# Patient Record
Sex: Male | Born: 1935 | Race: Black or African American | Hispanic: No | Marital: Single | Smoking: Current every day smoker
Health system: Southern US, Community
[De-identification: ages and names within clinical notes are randomized; demographics above are authoritative.]

## PROBLEM LIST (undated history)

## (undated) DIAGNOSIS — E119 Type 2 diabetes mellitus without complications: Secondary | ICD-10-CM

## (undated) DIAGNOSIS — IMO0001 Reserved for inherently not codable concepts without codable children: Secondary | ICD-10-CM

## (undated) HISTORY — PX: APPENDECTOMY (OPEN): SHX54

---

## 2002-08-12 ENCOUNTER — Emergency Department: Admit: 2002-08-12 | Payer: Self-pay | Source: Emergency Department | Admitting: Emergency Medicine

## 2003-03-11 ENCOUNTER — Ambulatory Visit
Admit: 2003-03-11 | Disposition: A | Discharge status: Home / Self Care | Payer: Self-pay | Source: Ambulatory Visit | Admitting: Internal Medicine

## 2003-07-09 ENCOUNTER — Ambulatory Visit
Admit: 2003-07-09 | Disposition: A | Discharge status: Home / Self Care | Payer: Self-pay | Source: Ambulatory Visit | Admitting: Internal Medicine

## 2004-02-25 ENCOUNTER — Ambulatory Visit
Admit: 2004-02-25 | Disposition: A | Discharge status: Home / Self Care | Payer: Self-pay | Source: Ambulatory Visit | Admitting: Internal Medicine

## 2004-03-02 ENCOUNTER — Ambulatory Visit
Admit: 2004-03-02 | Disposition: A | Discharge status: Home / Self Care | Payer: Self-pay | Source: Ambulatory Visit | Admitting: Internal Medicine

## 2004-03-17 ENCOUNTER — Ambulatory Visit: Admit: 2004-03-17 | Disposition: A | Discharge status: Home / Self Care | Payer: Self-pay | Source: Ambulatory Visit

## 2004-07-07 ENCOUNTER — Ambulatory Visit
Admit: 2004-07-07 | Disposition: A | Discharge status: Home / Self Care | Payer: Self-pay | Source: Ambulatory Visit | Admitting: Internal Medicine

## 2004-08-05 ENCOUNTER — Ambulatory Visit
Admit: 2004-08-05 | Disposition: A | Discharge status: Home / Self Care | Payer: Self-pay | Source: Ambulatory Visit | Admitting: Orthopaedic Surgery

## 2004-09-25 ENCOUNTER — Inpatient Hospital Stay
Admission: RE | Admit: 2004-09-25 | Disposition: A | Discharge status: Home / Self Care | Payer: Self-pay | Source: Ambulatory Visit | Admitting: Internal Medicine

## 2004-11-30 ENCOUNTER — Ambulatory Visit
Admit: 2004-11-30 | Disposition: A | Discharge status: Home / Self Care | Payer: Self-pay | Source: Ambulatory Visit | Admitting: Orthopaedic Surgery

## 2005-01-31 ENCOUNTER — Inpatient Hospital Stay
Admission: EM | Admit: 2005-01-31 | Disposition: A | Discharge status: Home / Self Care | Payer: Self-pay | Source: Emergency Department | Admitting: Internal Medicine

## 2005-08-18 ENCOUNTER — Emergency Department: Admit: 2005-08-18 | Payer: Self-pay | Source: Emergency Department | Admitting: Addiction Medicine

## 2006-07-16 ENCOUNTER — Ambulatory Visit
Admit: 2006-07-16 | Disposition: A | Discharge status: Home / Self Care | Payer: Self-pay | Source: Ambulatory Visit | Admitting: Internal Medicine

## 2006-10-31 ENCOUNTER — Emergency Department: Admit: 2006-10-31 | Payer: Self-pay | Source: Emergency Department | Admitting: Pediatric Emergency Medicine

## 2006-10-31 LAB — BASIC METABOLIC PANEL - AH CERNER
Anion Gap: 8 mEq/L (ref 5–15)
BUN: 17 mg/dL (ref 8–20)
CO2: 25.1 mEq/L (ref 22.0–29.0)
Calcium: 9 mg/dL (ref 8.8–10.2)
Chloride: 104 mEq/L (ref 96–108)
Creatinine: 1.1 mg/dL (ref 0.5–1.2)
Glucose: 221 mg/dL
Osmolality Calculated: 292 mosm/kg (ref 282–298)
Potassium: 4.5 mEq/L (ref 3.3–5.1)
Sodium: 137 mEq/L (ref 135–145)
UN/CREA SOFT: 15 RATIO (ref 6–33)

## 2006-10-31 LAB — URINALYSIS WITH MICROSCOPIC
Bilirubin, UA: NEGATIVE
Blood, UA: NEGATIVE
Glucose, UA: 500
Ketones UA: NEGATIVE
Leukocyte Esterase, UA: NEGATIVE
Nitrite, UA: NEGATIVE
Protein, UR: NEGATIVE
Specific Gravity UA POCT: 1.031 — ABNORMAL HIGH (ref 1.005–1.030)
Urine pH: 5 (ref 4.6–8.0)
Urobilinogen, UA: 1 EU/dL (ref 0.2–1.0)
WBC, UA: 6 /HPF — ABNORMAL HIGH (ref 0–5)

## 2006-10-31 LAB — CBC WITH AUTO DIFFERENTIAL CERNER
Basophils Absolute: 0 /mm3 (ref 0.0–0.2)
Basophils: 0 % (ref 0–2)
Eosinophils Absolute: 0 /mm3 (ref 0.0–0.2)
Eosinophils: 1 % (ref 0–5)
Granulocytes Absolute: 4.4 /mm3 (ref 1.8–8.1)
Hematocrit: 41.3 % — ABNORMAL LOW (ref 42.0–52.0)
Hgb: 14 G/DL (ref 13.0–17.0)
Lymphocytes Absolute: 0.8 /mm3 (ref 0.5–4.4)
Lymphocytes: 13 % — ABNORMAL LOW (ref 15–41)
MCH: 31.9 PG (ref 28.0–32.0)
MCHC: 33.9 G/DL (ref 32.0–36.0)
MCV: 93.9 FL (ref 80.0–100.0)
MPV: 8.7 FL (ref 7.4–10.4)
Monocytes Absolute: 0.5 /mm3 (ref 0.0–1.2)
Monocytes: 9 % (ref 0–11)
Neutrophils %: 77 % — ABNORMAL HIGH (ref 52–75)
Platelets: 170 /mm3 (ref 140–400)
RBC: 4.39 /mm3 — ABNORMAL LOW (ref 4.70–6.00)
RDW: 13.1 % (ref 11.5–15.0)
WBC: 5.8 /mm3 (ref 3.5–10.8)

## 2006-10-31 LAB — RAPID INFLUENZA A/B ANTIGENS

## 2006-10-31 LAB — HEMOLYSIS INDEX: Hemolysis Index: 5 Units

## 2006-10-31 LAB — GFR

## 2006-11-14 ENCOUNTER — Ambulatory Visit
Admit: 2006-11-14 | Disposition: A | Discharge status: Home / Self Care | Payer: Self-pay | Source: Ambulatory Visit | Admitting: Internal Medicine

## 2006-11-14 LAB — COMPREHENSIVE METABOLIC PANEL - AH CERNER
ALT: 16 U/L (ref 0–41)
AST (SGOT): 17 U/L (ref 0–37)
Albumin/Globulin Ratio: 1.5 (ref 1.1–2.2)
Albumin: 4.1 g/dL (ref 3.4–4.8)
Alkaline Phosphatase: 87 U/L (ref 40–129)
Anion Gap: 9 mEq/L (ref 5–15)
BUN: 12 mg/dL (ref 8–20)
Bilirubin, Total: 0.4 mg/dL (ref 0.0–1.0)
CA: 3.9 mEq/L (ref 3.8–4.6)
CO2: 25.7 mEq/L (ref 22.0–29.0)
Calcium: 8.8 mg/dL (ref 8.8–10.2)
Chloride: 106 mEq/L (ref 96–108)
Creatinine: 1 mg/dL (ref 0.5–1.2)
Globulin: 2.7 g/dL (ref 2.0–3.6)
Glucose: 221 mg/dL
Osmolality Calculated: 299 mosm/kg — ABNORMAL HIGH (ref 282–298)
Potassium: 4.2 mEq/L (ref 3.3–5.1)
Protein, Total: 6.8 g/dL (ref 6.4–8.3)
Sodium: 141 mEq/L (ref 135–145)
UN/CREA SOFT: 12 RATIO (ref 6–33)

## 2006-11-14 LAB — LIPID STUDY FASTING AM CERNER
Cholesterol / HDL Ratio: 4.68 RATIO (ref ?–4.97)
Cholesterol: 206 mg/dL — ABNORMAL HIGH (ref 100–199)
HDL: 44 mg/dL — ABNORMAL LOW (ref >55)
LDL: 133 mg/dL — ABNORMAL HIGH (ref ?–130)
Triglycerides: 143 md/dL (ref 10–190)
VLDL: 29 mg/dL (ref 0–40)

## 2006-11-14 LAB — GLYCOHEMOGLOBIN A1C* CERNER: % Hgb A1C: 9.7 % — ABNORMAL HIGH (ref 4.8–6.0)

## 2006-11-14 LAB — PSA: Prostate Specific Antigen, Total: 0.689 ng/mL (ref 0.000–4.000)

## 2006-11-14 LAB — GFR

## 2006-11-14 LAB — HEMOLYSIS INDEX: Hemolysis Index: 8 Units

## 2007-12-30 ENCOUNTER — Ambulatory Visit
Admit: 2007-12-30 | Disposition: A | Discharge status: Home / Self Care | Payer: Self-pay | Source: Ambulatory Visit | Admitting: Internal Medicine

## 2007-12-30 LAB — COMPREHENSIVE METABOLIC PANEL - AH CERNER
ALT: 14 U/L (ref 0–41)
AST (SGOT): 17 U/L (ref 0–37)
Albumin/Globulin Ratio: 1.5 (ref 1.1–2.2)
Albumin: 4.4 g/dL (ref 3.4–4.8)
Alkaline Phosphatase: 74 U/L (ref 40–129)
Anion Gap: 10 mEq/L (ref 5–15)
BUN: 21 mg/dL (ref 8–23)
Bilirubin, Total: 0.5 mg/dL (ref 0.0–1.0)
CA: 4 mEq/L (ref 3.8–4.6)
CO2: 26.4 mEq/L (ref 23.0–32.5)
Calcium: 9.4 mg/dL (ref 8.8–10.2)
Chloride: 106 mEq/L (ref 96–108)
Creatinine: 1 mg/dL (ref 0.5–1.2)
Globulin: 3 g/dL (ref 2.0–3.6)
Glucose: 88 mg/dL (ref 70–100)
Osmolality Calculated: 296 mosm/kg (ref 282–298)
Potassium: 5.2 mEq/L — ABNORMAL HIGH (ref 3.3–5.1)
Protein, Total: 7.4 g/dL (ref 6.4–8.3)
Sodium: 142 mEq/L (ref 133–145)
UN/CREA SOFT: 21 RATIO (ref 6–33)

## 2007-12-30 LAB — GFR

## 2007-12-30 LAB — CBC AND DIFFERENTIAL
Basophils Absolute: 0 /mm3 (ref 0.0–0.2)
Basophils: 0 % (ref 0–2)
Eosinophils Absolute: 0.1 /mm3 (ref 0.0–0.7)
Eosinophils: 2 % (ref 0–5)
Granulocytes Absolute: 3.3 /mm3 (ref 1.8–8.1)
Hematocrit: 45.3 % (ref 42.0–52.0)
Hgb: 14.8 G/DL (ref 13.0–17.0)
Immature Granulocytes Absolute: 0 CUMM (ref 0.0–0.0)
Immature Granulocytes: 0 % (ref 0–1)
Lymphocytes Absolute: 1.3 /mm3 (ref 0.5–4.4)
Lymphocytes: 25 % (ref 15–41)
MCH: 31.5 PG (ref 28.0–32.0)
MCHC: 32.7 G/DL (ref 32.0–36.0)
MCV: 96.4 FL (ref 80.0–100.0)
MPV: 10.7 FL (ref 9.4–12.3)
Monocytes Absolute: 0.5 /mm3 (ref 0.0–1.2)
Monocytes: 10 % (ref 0–11)
Neutrophils %: 63 % (ref 52–75)
Platelets: 236 /mm3 (ref 140–400)
RBC: 4.7 /mm3 (ref 4.70–6.00)
RDW: 13.6 % (ref 11.5–15.0)
WBC: 5.24 /mm3 (ref 3.50–10.80)

## 2007-12-30 LAB — PSA: Prostate Specific Antigen, Total: 0.671 ng/mL (ref 0.000–4.000)

## 2007-12-30 LAB — LIPID STUDY FASTING AM CERNER
Cholesterol / HDL Ratio: 3.56 RATIO (ref ?–4.97)
Cholesterol: 171 mg/dL (ref 100–199)
HDL: 48 mg/dL — ABNORMAL LOW (ref >55)
LDL: 111 mg/dL (ref ?–130)
Triglycerides: 58 md/dL (ref 10–190)
VLDL: 12 mg/dL (ref 0–40)

## 2007-12-30 LAB — HEMOLYSIS INDEX: Hemolysis Index: 3 Units

## 2007-12-30 LAB — URIC ACID: Uric acid: 5.7 mg/dL (ref 3.4–7.0)

## 2007-12-30 LAB — VITAMIN B12: Vitamin B-12: 284 pg/mL (ref 243–894)

## 2007-12-30 LAB — TSH: TSH: 1.27 u[IU]/mL (ref 0.400–4.610)

## 2008-03-06 ENCOUNTER — Ambulatory Visit
Admit: 2008-03-06 | Disposition: A | Discharge status: Home / Self Care | Payer: Self-pay | Source: Ambulatory Visit | Admitting: Internal Medicine

## 2009-04-16 ENCOUNTER — Ambulatory Visit
Admit: 2009-04-16 | Disposition: A | Discharge status: Home / Self Care | Payer: Self-pay | Source: Ambulatory Visit | Admitting: Internal Medicine

## 2009-04-16 LAB — URINALYSIS WITH MICROSCOPIC
Bilirubin, UA: NEGATIVE
Blood, UA: NEGATIVE
Glucose, UA: NEGATIVE
Ketones UA: NEGATIVE
Leukocyte Esterase, UA: NEGATIVE
Nitrite, UA: NEGATIVE
Protein, UR: NEGATIVE
RBC, UA: 1 /HPF (ref 0–3)
Specific Gravity UA POCT: 1.02 (ref 1.005–1.030)
Urine pH: 6 (ref 4.6–8.0)
Urobilinogen, UA: NORMAL mg/dL
WBC, UA: <=1 /HPF (ref 0–?)

## 2009-04-16 LAB — COMPREHENSIVE METABOLIC PANEL
ALT: 11 U/L (ref 0–55)
AST (SGOT): 15 U/L (ref 5–34)
Albumin/Globulin Ratio: 1.2 (ref 0.9–2.2)
Albumin: 3.7 g/dL (ref 3.5–5.0)
Alkaline Phosphatase: 77 U/L (ref 40–150)
BUN: 21 mg/dL (ref 9–21)
Bilirubin, Total: 0.5 mg/dL (ref 0.2–1.2)
CO2: 25 mEq/L (ref 22–29)
Calcium: 9 mg/dL (ref 8.9–10.0)
Chloride: 108 mEq/L — ABNORMAL HIGH (ref 96–107)
Creatinine: 1 mg/dL (ref 0.7–1.3)
Globulin: 3.2 g/dL (ref 2.0–3.6)
Glucose: 120 mg/dL — ABNORMAL HIGH (ref 70–110)
Potassium: 4.5 mEq/L (ref 3.5–5.1)
Protein, Total: 6.9 g/dL (ref 6.0–8.3)
Sodium: 140 mEq/L (ref 136–145)

## 2009-04-16 LAB — LIPID PANEL
Cholesterol: 152 mg/dL (ref ?–199)
HDL: 41 mg/dL (ref 40–65)
LDL Calculated: 94 mg/dL (ref 0–99)
Triglycerides: 86 mg/dL (ref 34–149)
VLDL Calculated: 17 mg/dL (ref 7–30)

## 2009-04-16 LAB — HEMOLYSIS INDEX: Hemolysis Index: 2 Units

## 2009-04-16 LAB — HEMOGLOBIN A1C: Hemoglobin A1C: 6.5 % — ABNORMAL HIGH (ref ?–6.0)

## 2009-04-16 LAB — GFR

## 2009-04-16 LAB — CH/HDL: Cholesterol / HDL Ratio: 3.7 RATIO

## 2009-04-18 LAB — MICROALBUMIN-RANDOM, U

## 2009-07-04 ENCOUNTER — Ambulatory Visit
Admit: 2009-07-04 | Disposition: A | Discharge status: Home / Self Care | Payer: Self-pay | Source: Ambulatory Visit | Admitting: Internal Medicine

## 2009-07-04 LAB — COMPREHENSIVE METABOLIC PANEL
ALT: 10 U/L (ref 0–55)
AST (SGOT): 15 U/L (ref 5–34)
Albumin/Globulin Ratio: 1.1 (ref 0.9–2.2)
Albumin: 3.7 g/dL (ref 3.5–5.0)
Alkaline Phosphatase: 68 U/L (ref 40–150)
BUN: 21 mg/dL (ref 9–21)
Bilirubin, Total: 0.5 mg/dL (ref 0.2–1.2)
CO2: 23 mEq/L (ref 22–29)
Calcium: 8.8 mg/dL (ref 7.9–10.6)
Chloride: 107 mEq/L (ref 96–107)
Creatinine: 1.1 mg/dL (ref 0.7–1.3)
Globulin: 3.3 g/dL (ref 2.0–3.6)
Glucose: 133 mg/dL — ABNORMAL HIGH (ref 70–110)
Potassium: 4.4 mEq/L (ref 3.5–5.1)
Protein, Total: 7 g/dL (ref 6.0–8.3)
Sodium: 139 mEq/L (ref 136–145)

## 2009-07-04 LAB — TSH: TSH: 1.201 u[IU]/mL (ref 0.340–4.820)

## 2009-07-04 LAB — CBC AND DIFFERENTIAL
Basophils Absolute: 0 /mm3 (ref 0.0–0.2)
Basophils: 0 % (ref 0–2)
Eosinophils Absolute: 0.1 /mm3 (ref 0.0–0.7)
Eosinophils: 2 % (ref 0–5)
Granulocytes Absolute: 2.7 /mm3 (ref 1.8–8.1)
Hematocrit: 42.2 % (ref 42.0–52.0)
Hgb: 14.2 G/DL (ref 13.0–17.0)
Immature Granulocytes Absolute: 0 CUMM (ref 0.0–0.0)
Immature Granulocytes: 0 % (ref 0–1)
Lymphocytes Absolute: 1.5 /mm3 (ref 0.5–4.4)
Lymphocytes: 32 % (ref 15–41)
MCH: 31.8 PG (ref 28.0–32.0)
MCHC: 33.6 G/DL (ref 32.0–36.0)
MCV: 94.6 FL (ref 80.0–100.0)
MPV: 11.2 FL (ref 9.4–12.3)
Monocytes Absolute: 0.3 /mm3 (ref 0.0–1.2)
Monocytes: 7 % (ref 0–11)
Neutrophils %: 59 % (ref 52–75)
Platelets: 198 /mm3 (ref 140–400)
RBC: 4.46 /mm3 — ABNORMAL LOW (ref 4.70–6.00)
RDW: 13.1 % (ref 11.5–15.0)
WBC: 4.58 /mm3 (ref 3.50–10.80)

## 2009-07-04 LAB — LIPID PANEL
Cholesterol: 181 mg/dL (ref ?–199)
HDL: 44 mg/dL (ref 40–65)
LDL Calculated: 118 mg/dL — ABNORMAL HIGH (ref 0–99)
Triglycerides: 97 mg/dL (ref 34–149)
VLDL Calculated: 19 mg/dL (ref 7–30)

## 2009-07-04 LAB — CH/HDL: Cholesterol / HDL Ratio: 4.1 RATIO

## 2009-07-04 LAB — HEMOLYSIS INDEX: Hemolysis Index: 7 Units

## 2009-07-04 LAB — GFR

## 2009-07-04 LAB — HEMOGLOBIN A1C: Hemoglobin A1C: 6.7 % — ABNORMAL HIGH (ref ?–6.0)

## 2009-07-04 LAB — URIC ACID: Uric acid: 5.1 mg/dL (ref 3.5–7.2)

## 2009-07-05 LAB — VITAMIN B12: Vitamin B-12: 231 pg/mL (ref 211–911)

## 2009-09-30 ENCOUNTER — Ambulatory Visit
Admit: 2009-09-30 | Disposition: A | Discharge status: Home / Self Care | Payer: Self-pay | Source: Ambulatory Visit | Admitting: Internal Medicine

## 2009-10-01 ENCOUNTER — Ambulatory Visit
Admit: 2009-10-01 | Disposition: A | Discharge status: Home / Self Care | Payer: Self-pay | Source: Ambulatory Visit | Admitting: Internal Medicine

## 2009-10-08 ENCOUNTER — Ambulatory Visit
Admit: 2009-10-08 | Disposition: A | Discharge status: Home / Self Care | Payer: Self-pay | Source: Ambulatory Visit | Admitting: Internal Medicine

## 2009-10-08 LAB — HEMOLYSIS INDEX
Hemolysis Index: 5
Hemolysis Index: 10

## 2009-10-08 LAB — COMPREHENSIVE METABOLIC PANEL
ALT: 11 U/L (ref 0–55)
AST (SGOT): 16 U/L (ref 5–34)
Albumin/Globulin Ratio: 1.1 (ref 0.9–2.2)
Albumin: 3.9 g/dL (ref 3.5–5.0)
Alkaline Phosphatase: 67 U/L (ref 40–150)
BUN: 17 mg/dL (ref 9.0–21.0)
Bilirubin, Total: 0.5 mg/dL (ref 0.2–1.2)
CO2: 25 mEq/L (ref 22–29)
Calcium: 9.1 mg/dL (ref 7.9–10.6)
Chloride: 107 mEq/L (ref 98–107)
Creatinine: 1 mg/dL (ref 0.7–1.3)
Globulin: 3.5 g/dL (ref 2.0–3.6)
Glucose: 136 mg/dL — ABNORMAL HIGH (ref 70–100)
Potassium: 4.6 mEq/L (ref 3.5–5.1)
Protein, Total: 7.4 g/dL (ref 6.0–8.3)
Sodium: 140 mEq/L (ref 136–145)

## 2009-10-08 LAB — LIPID PANEL
Cholesterol / HDL Ratio: 5 mg/dL (ref 0–200)
Cholesterol: 218 mg/dL — ABNORMAL HIGH (ref 0–199)
HDL: 46 mg/dL (ref >40)
LDL Calculated: 154 mg/dL — ABNORMAL HIGH (ref 0–99)
Triglycerides: 91 mg/dL (ref 34–149)
VLDL Calculated: 18 mg/dL (ref 10–40)

## 2009-10-08 LAB — URIC ACID: Uric acid: 4.5 mg/dL (ref 3.5–7.2)

## 2009-10-08 LAB — TSH: TSH: 1.54 u[IU]/mL (ref 0.35–4.94)

## 2009-10-08 LAB — GFR: EGFR: >60

## 2009-10-08 LAB — PSA: Prostate Specific Antigen, Total: 0.648 ng/mL (ref 0.000–4.000)

## 2009-10-08 LAB — HEMOGLOBIN A1C: Hemoglobin A1C: 6.5 % — ABNORMAL HIGH (ref 0.0–6.0)

## 2009-10-09 LAB — VITAMIN B12: Vitamin B-12: 359 pg/mL (ref 211–911)

## 2009-10-29 ENCOUNTER — Ambulatory Visit
Admit: 2009-10-29 | Disposition: A | Discharge status: Home / Self Care | Payer: Self-pay | Source: Ambulatory Visit | Admitting: Internal Medicine

## 2010-02-04 ENCOUNTER — Ambulatory Visit
Admit: 2010-02-04 | Disposition: A | Discharge status: Home / Self Care | Payer: Self-pay | Source: Ambulatory Visit | Admitting: Internal Medicine

## 2010-02-04 LAB — LIPID PANEL
Cholesterol / HDL Ratio: 4.5 Index
Cholesterol: 149 mg/dL (ref 0–199)
HDL: 33 mg/dL — ABNORMAL LOW (ref >40)
LDL Calculated: 89 mg/dL (ref 0–99)
Triglycerides: 136 mg/dL (ref 34–149)
VLDL Calculated: 27 mg/dL (ref 10–40)

## 2010-02-04 LAB — COMPREHENSIVE METABOLIC PANEL
ALT: 10 U/L (ref 0–55)
AST (SGOT): 15 U/L (ref 5–34)
Albumin/Globulin Ratio: 1.1 (ref 0.9–2.2)
Albumin: 3.5 g/dL (ref 3.5–5.0)
Alkaline Phosphatase: 78 U/L (ref 40–150)
BUN: 11 mg/dL (ref 9.0–21.0)
Bilirubin, Total: 0.6 mg/dL (ref 0.2–1.2)
CO2: 24 mEq/L (ref 22–29)
Calcium: 9 mg/dL (ref 7.9–10.6)
Chloride: 107 mEq/L (ref 98–107)
Creatinine: 0.8 mg/dL (ref 0.7–1.3)
Globulin: 3.2 g/dL (ref 2.0–3.6)
Glucose: 112 mg/dL — ABNORMAL HIGH (ref 70–100)
Potassium: 3.9 mEq/L (ref 3.5–5.1)
Protein, Total: 6.7 g/dL (ref 6.0–8.3)
Sodium: 141 mEq/L (ref 136–145)

## 2010-02-04 LAB — GFR: EGFR: >60

## 2010-02-04 LAB — HEMOGLOBIN A1C: Hemoglobin A1C: 6.1 % — ABNORMAL HIGH (ref 0.0–6.0)

## 2010-02-04 LAB — HEMOLYSIS INDEX
Hemolysis Index: 2 Index (ref 0–18)
Hemolysis Index: 2 Index (ref 0–18)

## 2010-03-03 ENCOUNTER — Ambulatory Visit
Admit: 2010-03-03 | Disposition: A | Discharge status: Home / Self Care | Payer: Self-pay | Source: Ambulatory Visit | Admitting: Otolaryngic Allergy

## 2010-06-24 ENCOUNTER — Ambulatory Visit
Admit: 2010-06-24 | Disposition: A | Discharge status: Home / Self Care | Payer: Self-pay | Source: Ambulatory Visit | Admitting: Internal Medicine

## 2010-10-01 ENCOUNTER — Ambulatory Visit
Admit: 2010-10-01 | Disposition: A | Discharge status: Home / Self Care | Payer: Self-pay | Source: Ambulatory Visit | Admitting: Internal Medicine

## 2010-10-01 LAB — URINALYSIS WITH MICROSCOPIC
Bilirubin, UA: NEGATIVE
Blood, UA: NEGATIVE
Glucose, UA: 500 — AB
Hyaline Casts, UA: 1 /LPF (ref 0–5)
Ketones UA: NEGATIVE
Leukocyte Esterase, UA: NEGATIVE
Nitrite, UA: NEGATIVE
Protein, UR: NEGATIVE
RBC, UA: <1 /HPF (ref 0–5)
Specific Gravity UA POCT: 1.03 (ref 1.001–1.035)
Squamous Epithelial Cells, Urine: <1 /HPF (ref 0–25)
Urine pH: 5 (ref 5.0–8.0)
Urobilinogen, UA: NORMAL mg/dL
WBC, UA: 1 /HPF (ref 0–5)

## 2010-10-01 LAB — CBC AND DIFFERENTIAL
Baso(Absolute): 0.02 10*3/uL (ref 0.00–0.20)
Basophils: 0 % (ref 0–2)
Eosinophils Absolute: 0.16 10*3/uL (ref 0.00–0.70)
Eosinophils: 3 % (ref 0–5)
Hematocrit: 42.9 % (ref 42.0–52.0)
Hgb: 14.6 g/dL (ref 13.0–17.0)
Immature Granulocytes Absolute: 0 10*3/uL
Immature Granulocytes: 0 % (ref 0–1)
Lymphocytes Absolute: 1.62 10*3/uL (ref 0.50–4.40)
Lymphocytes: 29 % (ref 15–41)
MCH: 32.2 pg — ABNORMAL HIGH (ref 28.0–32.0)
MCHC: 34 g/dL (ref 32.0–36.0)
MCV: 94.5 fL (ref 80.0–100.0)
MPV: 10.6 fL (ref 9.4–12.3)
Monocytes Absolute: 0.39 10*3/uL (ref 0.00–1.20)
Monocytes: 7 % (ref 0–11)
Neutrophils Absolute: 3.45 10*3/uL
Neutrophils: 61 % (ref 52–75)
Platelets: 236 10*3/uL (ref 140–400)
RBC: 4.54 10*6/uL — ABNORMAL LOW (ref 4.70–6.00)
RDW: 13 % (ref 12–15)
WBC: 5.64 10*3/uL (ref 3.50–10.80)

## 2010-10-01 LAB — COMPREHENSIVE METABOLIC PANEL
ALT: 9 U/L (ref 0–55)
AST (SGOT): 15 U/L (ref 5–34)
Albumin/Globulin Ratio: 1.1 (ref 0.9–2.2)
Albumin: 3.8 g/dL (ref 3.5–5.0)
Alkaline Phosphatase: 78 U/L (ref 40–150)
Anion Gap: 11 (ref 5.0–15.0)
BUN: 20 mg/dL (ref 9.0–21.0)
Bilirubin, Total: 0.4 mg/dL (ref 0.2–1.2)
CO2: 22 mEq/L (ref 22–29)
Calcium: 9.4 mg/dL (ref 7.9–10.6)
Chloride: 109 mEq/L — ABNORMAL HIGH (ref 98–107)
Creatinine: 1 mg/dL (ref 0.7–1.3)
Globulin: 3.6 g/dL (ref 2.0–3.6)
Glucose: 95 mg/dL (ref 70–100)
Potassium: 4.4 mEq/L (ref 3.5–5.1)
Protein, Total: 7.4 g/dL (ref 6.0–8.3)
Sodium: 142 mEq/L (ref 136–145)

## 2010-10-01 LAB — HEMOGLOBIN A1C: Hemoglobin A1C: 6 % (ref 0.0–6.0)

## 2010-10-01 LAB — VITAMIN B12: Vitamin B-12: 360 pg/mL (ref 211–911)

## 2010-10-01 LAB — LIPID PANEL
Cholesterol / HDL Ratio: 4.6 Index
Cholesterol: 196 mg/dL (ref 0–199)
HDL: 43 mg/dL (ref >40)
LDL Calculated: 133 mg/dL — ABNORMAL HIGH (ref 0–99)
Triglycerides: 99 mg/dL (ref 34–149)
VLDL Calculated: 20 mg/dL (ref 10–40)

## 2010-10-01 LAB — HEMOLYSIS INDEX
Hemolysis Index: 5 Index (ref 0–18)
Hemolysis Index: 6 Index (ref 0–9)

## 2010-10-01 LAB — URIC ACID: Uric acid: 5 mg/dL (ref 3.5–7.2)

## 2010-10-01 LAB — TSH: TSH: 2.34 u[IU]/mL (ref 0.35–4.94)

## 2010-10-01 LAB — GFR: EGFR: >60

## 2010-10-01 LAB — PSA: Prostate Specific Antigen, Total: 0.812 ng/mL (ref 0.000–4.000)

## 2010-10-02 LAB — MICROALBUMIN-RANDOM, U
Albumin/Creatinine Ratio: 3 mg/g (ref <17)
Creatinine: 250 mg/dL
Microalbumin: 7.3 mg/L

## 2011-03-11 ENCOUNTER — Ambulatory Visit: Admit: 2011-03-11 | Discharge: 2011-03-11 | Payer: Self-pay | Source: Ambulatory Visit

## 2011-03-11 LAB — COMPREHENSIVE METABOLIC PANEL
ALT: 10 U/L (ref 0–55)
AST (SGOT): 13 U/L (ref 5–34)
Albumin/Globulin Ratio: 1 (ref 0.9–2.2)
Albumin: 3.6 g/dL (ref 3.5–5.0)
Alkaline Phosphatase: 78 U/L (ref 40–150)
Anion Gap: 7 (ref 5.0–15.0)
BUN: 21 mg/dL (ref 9.0–21.0)
Bilirubin, Total: 0.4 mg/dL (ref 0.2–1.2)
CO2: 25 mEq/L (ref 22–29)
Calcium: 8.8 mg/dL (ref 7.9–10.6)
Chloride: 107 mEq/L (ref 98–107)
Creatinine: 1 mg/dL (ref 0.7–1.3)
Globulin: 3.6 g/dL (ref 2.0–3.6)
Glucose: 164 mg/dL — ABNORMAL HIGH (ref 70–100)
Potassium: 4.3 mEq/L (ref 3.5–5.1)
Protein, Total: 7.2 g/dL (ref 6.0–8.3)
Sodium: 139 mEq/L (ref 136–145)

## 2011-03-11 LAB — HEMOLYSIS INDEX
Hemolysis Index: 0 Index (ref 0–18)
Hemolysis Index: 5 Index (ref 0–9)

## 2011-03-11 LAB — GFR: EGFR: >60

## 2011-03-11 LAB — LIPID PANEL
Cholesterol / HDL Ratio: 4.4 Index
Cholesterol: 203 mg/dL — ABNORMAL HIGH (ref 0–199)
HDL: 46 mg/dL (ref >40)
LDL Calculated: 137 mg/dL — ABNORMAL HIGH (ref 0–99)
Triglycerides: 101 mg/dL (ref 34–149)
VLDL Calculated: 20 mg/dL (ref 10–40)

## 2011-03-11 LAB — PSA: Prostate Specific Antigen, Total: 0.969 ng/mL (ref 0.000–4.000)

## 2011-03-12 LAB — HEMOGLOBIN A1C: Hemoglobin A1C: 6.3 % — ABNORMAL HIGH (ref 0.0–6.0)

## 2011-08-19 LAB — ECG 12-LEAD
Atrial Rate: 83 {beats}/min
P Axis: 47 degrees
P-R Interval: 172 ms
Q-T Interval: 344 ms
QRS Duration: 84 ms
QTC Calculation (Bezet): 404 ms
R Axis: 45 degrees
T Axis: 39 degrees
Ventricular Rate: 83 {beats}/min

## 2011-10-05 ENCOUNTER — Ambulatory Visit: Admit: 2011-10-05 | Disposition: A | Discharge status: Home / Self Care | Payer: Self-pay | Source: Ambulatory Visit

## 2011-10-05 LAB — URINE ICTOTEST

## 2011-10-05 LAB — CBC AND DIFFERENTIAL
Basophils Absolute Automated: 0.02 10*3/uL (ref 0.00–0.20)
Basophils Automated: 0 % (ref 0–2)
Eosinophils Absolute Automated: 0.11 10*3/uL (ref 0.00–0.70)
Eosinophils Automated: 2 % (ref 0–5)
Hematocrit: 40.7 % — ABNORMAL LOW (ref 42.0–52.0)
Hgb: 13 g/dL (ref 13.0–17.0)
Immature Granulocytes Absolute: 0.01 10*3/uL
Immature Granulocytes: 0 % (ref 0–1)
Lymphocytes Absolute Automated: 1.15 10*3/uL (ref 0.50–4.40)
Lymphocytes Automated: 20 % (ref 15–41)
MCH: 31.6 pg (ref 28.0–32.0)
MCHC: 31.9 g/dL — ABNORMAL LOW (ref 32.0–36.0)
MCV: 98.8 fL (ref 80.0–100.0)
MPV: 10.9 fL (ref 9.4–12.3)
Monocytes Absolute Automated: 0.38 10*3/uL (ref 0.00–1.20)
Monocytes: 7 % (ref 0–11)
Neutrophils Absolute: 3.97 10*3/uL (ref 1.80–8.10)
Neutrophils: 70 % (ref 52–75)
Nucleated RBC: 0 /100 WBC
Platelets: 239 10*3/uL (ref 140–400)
RBC: 4.12 10*6/uL — ABNORMAL LOW (ref 4.70–6.00)
RDW: 14 % (ref 12–15)
WBC: 5.64 10*3/uL (ref 3.50–10.80)

## 2011-10-05 LAB — URINALYSIS WITH MICROSCOPIC
Blood, UA: NEGATIVE
Glucose, UA: NEGATIVE
Leukocyte Esterase, UA: NEGATIVE
Nitrite, UA: NEGATIVE
Protein, UR: NEGATIVE
Specific Gravity UA POCT: 1.024 (ref 1.001–1.035)
Urine pH: 5.5 (ref 5.0–8.0)
Urobilinogen, UA: 0.2 EU/dL

## 2011-10-05 LAB — LIPID PANEL
Cholesterol / HDL Ratio: 3.7 Index
Cholesterol: 161 mg/dL (ref 0–199)
HDL: 44 mg/dL (ref >40)
LDL Calculated: 99 mg/dL (ref 0–99)
Triglycerides: 91 mg/dL (ref 34–149)
VLDL Calculated: 18 mg/dL (ref 10–40)

## 2011-10-05 LAB — COMPREHENSIVE METABOLIC PANEL
ALT: 21 U/L (ref 0–55)
AST (SGOT): 18 U/L (ref 5–34)
Albumin/Globulin Ratio: 1.1 (ref 0.9–2.2)
Albumin: 3.6 g/dL (ref 3.5–5.0)
Alkaline Phosphatase: 83 U/L (ref 40–150)
BUN: 12 mg/dL (ref 8.0–20.0)
Bilirubin, Total: 0.6 mg/dL (ref 0.1–1.2)
CO2: 24 mEq/L (ref 21–30)
Calcium: 9.1 mg/dL (ref 7.9–10.6)
Chloride: 107 mEq/L (ref 96–109)
Creatinine: 0.9 mg/dL (ref 0.5–1.5)
Globulin: 3.4 g/dL (ref 2.0–3.7)
Glucose: 107 mg/dL — ABNORMAL HIGH (ref 70–100)
Potassium: 4.6 mEq/L (ref 3.5–5.3)
Protein, Total: 7 g/dL (ref 6.0–8.3)
Sodium: 141 mEq/L (ref 135–146)

## 2011-10-05 LAB — GFR: EGFR: >60

## 2011-10-05 LAB — PSA: Prostate Specific Antigen, Total: 0.959 ng/mL (ref 0.000–4.000)

## 2011-10-05 LAB — T4, FREE: T4 Free: 1.02 ng/dL (ref 0.70–1.48)

## 2011-10-05 LAB — TSH: TSH: 1.17 u[IU]/mL (ref 0.35–4.94)

## 2011-10-05 LAB — HEMOLYSIS INDEX: Hemolysis Index: 1 Index (ref 0–9)

## 2011-10-06 LAB — HEMOGLOBIN A1C: Hemoglobin A1C: 6 % (ref 0.0–6.0)

## 2011-11-03 ENCOUNTER — Ambulatory Visit
Admit: 2011-11-03 | Discharge: 2011-11-03 | Disposition: A | Discharge status: Home / Self Care | Payer: Self-pay | Source: Ambulatory Visit

## 2011-11-03 LAB — COMPREHENSIVE METABOLIC PANEL
ALT: 12 U/L (ref 0–55)
AST (SGOT): 17 U/L (ref 5–34)
Albumin/Globulin Ratio: 1.1 (ref 0.9–2.2)
Albumin: 3.7 g/dL (ref 3.5–5.0)
Alkaline Phosphatase: 77 U/L (ref 40–150)
BUN: 14 mg/dL (ref 8.0–20.0)
Bilirubin, Total: 0.4 mg/dL (ref 0.1–1.2)
CO2: 25 mEq/L (ref 21–30)
Calcium: 9.3 mg/dL (ref 7.9–10.6)
Chloride: 107 mEq/L (ref 96–109)
Creatinine: 0.9 mg/dL (ref 0.5–1.5)
Globulin: 3.3 g/dL (ref 2.0–3.7)
Glucose: 119 mg/dL — ABNORMAL HIGH (ref 70–100)
Potassium: 5 mEq/L (ref 3.5–5.3)
Protein, Total: 7 g/dL (ref 6.0–8.3)
Sodium: 141 mEq/L (ref 135–146)

## 2011-11-03 LAB — LIPID PANEL
Cholesterol / HDL Ratio: 4.2 Index
Cholesterol: 165 mg/dL (ref 0–199)
HDL: 39 mg/dL — ABNORMAL LOW (ref >40)
LDL Calculated: 108 mg/dL — ABNORMAL HIGH (ref 0–99)
Triglycerides: 92 mg/dL (ref 34–149)
VLDL Calculated: 18 mg/dL (ref 10–40)

## 2011-11-03 LAB — HEMOLYSIS INDEX: Hemolysis Index: 3 Index (ref 0–9)

## 2011-11-03 LAB — GFR: EGFR: >60

## 2011-11-03 LAB — HEMOGLOBIN A1C: Hemoglobin A1C: 6.2 % — ABNORMAL HIGH (ref 0.0–6.0)

## 2012-02-25 ENCOUNTER — Ambulatory Visit
Admit: 2012-02-25 | Discharge: 2012-02-25 | Disposition: A | Discharge status: Home / Self Care | Payer: Self-pay | Source: Ambulatory Visit

## 2012-02-25 LAB — URINALYSIS, REFLEX TO MICROSCOPIC EXAM IF INDICATED
Bilirubin, UA: NEGATIVE
Blood, UA: NEGATIVE
Glucose, UA: NEGATIVE
Ketones UA: NEGATIVE
Leukocyte Esterase, UA: NEGATIVE
Nitrite, UA: NEGATIVE
Protein, UR: NEGATIVE
Specific Gravity UA: 1.02 (ref 1.001–1.035)
Urine pH: 5 (ref 5.0–8.0)
Urobilinogen, UA: NEGATIVE mg/dL

## 2012-02-25 LAB — TSH: TSH: 1.68 u[IU]/mL (ref 0.35–4.94)

## 2012-02-25 LAB — COMPREHENSIVE METABOLIC PANEL
ALT: 10 U/L (ref 0–55)
AST (SGOT): 15 U/L (ref 5–34)
Albumin/Globulin Ratio: 0.9 (ref 0.9–2.2)
Albumin: 3.5 g/dL (ref 3.5–5.0)
Alkaline Phosphatase: 94 U/L (ref 40–150)
BUN: 15 mg/dL (ref 8.0–20.0)
Bilirubin, Total: 0.5 mg/dL (ref 0.1–1.2)
CO2: 25 mEq/L (ref 21–30)
Calcium: 9.3 mg/dL (ref 7.9–10.6)
Chloride: 107 mEq/L (ref 96–109)
Creatinine: 1 mg/dL (ref 0.5–1.5)
Globulin: 3.7 g/dL (ref 2.0–3.7)
Glucose: 120 mg/dL — ABNORMAL HIGH (ref 70–100)
Potassium: 4.5 mEq/L (ref 3.5–5.3)
Protein, Total: 7.2 g/dL (ref 6.0–8.3)
Sodium: 140 mEq/L (ref 135–146)

## 2012-02-25 LAB — URIC ACID: Uric acid: 5.7 mg/dL (ref 3.4–7.2)

## 2012-02-25 LAB — LIPID PANEL
Cholesterol / HDL Ratio: 5 Index
Cholesterol: 179 mg/dL (ref 0–199)
HDL: 36 mg/dL — ABNORMAL LOW (ref >40)
LDL Calculated: 120 mg/dL — ABNORMAL HIGH (ref 0–99)
Triglycerides: 116 mg/dL (ref 34–149)
VLDL Calculated: 23 mg/dL (ref 10–40)

## 2012-02-25 LAB — GFR: EGFR: >60

## 2012-02-25 LAB — HEMOLYSIS INDEX: Hemolysis Index: 2 Index (ref 0–9)

## 2012-02-26 LAB — HEMOGLOBIN A1C: Hemoglobin A1C: 6.2 % — ABNORMAL HIGH (ref 0.0–6.0)

## 2012-02-27 LAB — VITAMIN D-25 HYDROXY (D2/D3/TOTAL)
Vitamin D 25-OH D2: <4 ng/mL
Vitamin D 25-OH D3: 15 ng/mL
Vitamin D 25-OH Total: 15 ng/mL — ABNORMAL LOW (ref 30–100)

## 2012-07-06 ENCOUNTER — Ambulatory Visit
Admission: RE | Admit: 2012-07-06 | Discharge: 2012-07-06 | Disposition: A | Discharge status: Home / Self Care | Payer: Medicare Other | Source: Ambulatory Visit | Attending: Internal Medicine | Admitting: Internal Medicine

## 2012-07-06 DIAGNOSIS — E039 Hypothyroidism, unspecified: Secondary | ICD-10-CM | POA: Insufficient documentation

## 2012-07-06 DIAGNOSIS — E785 Hyperlipidemia, unspecified: Secondary | ICD-10-CM | POA: Insufficient documentation

## 2012-07-06 DIAGNOSIS — M129 Arthropathy, unspecified: Secondary | ICD-10-CM | POA: Insufficient documentation

## 2012-07-06 DIAGNOSIS — E119 Type 2 diabetes mellitus without complications: Secondary | ICD-10-CM | POA: Insufficient documentation

## 2012-07-06 DIAGNOSIS — E559 Vitamin D deficiency, unspecified: Secondary | ICD-10-CM | POA: Insufficient documentation

## 2012-07-06 DIAGNOSIS — D518 Other vitamin B12 deficiency anemias: Secondary | ICD-10-CM | POA: Insufficient documentation

## 2012-07-06 DIAGNOSIS — I10 Essential (primary) hypertension: Secondary | ICD-10-CM | POA: Insufficient documentation

## 2012-07-06 LAB — LIPID PANEL
Cholesterol / HDL Ratio: 5.1 Index
Cholesterol: 179 mg/dL (ref 0–199)
HDL: 35 mg/dL — ABNORMAL LOW (ref >40)
LDL Calculated: 118 mg/dL — ABNORMAL HIGH (ref 0–99)
Triglycerides: 131 mg/dL (ref 34–149)
VLDL Calculated: 26 mg/dL (ref 10–40)

## 2012-07-06 LAB — CBC AND DIFFERENTIAL
Basophils Absolute Automated: 0.03 10*3/uL (ref 0.00–0.20)
Basophils Automated: 1 % (ref 0–2)
Eosinophils Absolute Automated: 0.16 10*3/uL (ref 0.00–0.70)
Eosinophils Automated: 4 % (ref 0–5)
Hematocrit: 42.6 % (ref 42.0–52.0)
Hgb: 13.8 g/dL (ref 13.0–17.0)
Immature Granulocytes Absolute: 0.01 10*3/uL
Immature Granulocytes: 0 % (ref 0–1)
Lymphocytes Absolute Automated: 1.27 10*3/uL (ref 0.50–4.40)
Lymphocytes Automated: 31 % (ref 15–41)
MCH: 31.5 pg (ref 28.0–32.0)
MCHC: 32.4 g/dL (ref 32.0–36.0)
MCV: 97.3 fL (ref 80.0–100.0)
MPV: 11.1 fL (ref 9.4–12.3)
Monocytes Absolute Automated: 0.23 10*3/uL (ref 0.00–1.20)
Monocytes: 6 % (ref 0–11)
Neutrophils Absolute: 2.45 10*3/uL (ref 1.80–8.10)
Neutrophils: 59 % (ref 52–75)
Nucleated RBC: 0 /100 WBC (ref 0–1)
Platelets: 238 10*3/uL (ref 140–400)
RBC: 4.38 10*6/uL — ABNORMAL LOW (ref 4.70–6.00)
RDW: 14 % (ref 12–15)
WBC: 4.15 10*3/uL (ref 3.50–10.80)

## 2012-07-06 LAB — COMPREHENSIVE METABOLIC PANEL
ALT: 10 U/L (ref 0–55)
AST (SGOT): 16 U/L (ref 5–34)
Albumin/Globulin Ratio: 1 (ref 0.9–2.2)
Albumin: 3.7 g/dL (ref 3.5–5.0)
Alkaline Phosphatase: 83 U/L (ref 40–150)
BUN: 15 mg/dL (ref 8.0–20.0)
Bilirubin, Total: 0.5 mg/dL (ref 0.1–1.2)
CO2: 25 mEq/L (ref 21–30)
Calcium: 9.5 mg/dL (ref 7.9–10.6)
Chloride: 108 mEq/L (ref 96–109)
Creatinine: 1 mg/dL (ref 0.5–1.5)
Globulin: 3.6 g/dL (ref 2.0–3.7)
Glucose: 112 mg/dL — ABNORMAL HIGH (ref 70–100)
Potassium: 4.9 mEq/L (ref 3.5–5.3)
Protein, Total: 7.3 g/dL (ref 6.0–8.3)
Sodium: 141 mEq/L (ref 135–146)

## 2012-07-06 LAB — MICROALBUMIN, RANDOM URINE
Urine Creatinine, Random: 164.7 mg/dl
Urine Microalbumin, Random: <5 ug/ml (ref 0.0–30.0)

## 2012-07-06 LAB — URINALYSIS, REFLEX TO MICROSCOPIC EXAM IF INDICATED
Bilirubin, UA: NEGATIVE
Blood, UA: NEGATIVE
Glucose, UA: NEGATIVE
Ketones UA: NEGATIVE
Leukocyte Esterase, UA: NEGATIVE
Nitrite, UA: NEGATIVE
Protein, UR: NEGATIVE
Specific Gravity UA: 1.021 (ref 1.001–1.035)
Urine pH: 6.5 (ref 5.0–8.0)
Urobilinogen, UA: 0.2 EU/dL

## 2012-07-06 LAB — URIC ACID: Uric acid: 5.4 mg/dL (ref 3.4–7.2)

## 2012-07-06 LAB — HEMOLYSIS INDEX: Hemolysis Index: 3 Index (ref 0–9)

## 2012-07-06 LAB — VITAMIN D,25 OH,TOTAL: Vitamin D, 25 OH, Total: 21 ng/mL — ABNORMAL LOW (ref 30–100)

## 2012-07-06 LAB — PSA: Prostate Specific Antigen, Total: 0.701 ng/mL (ref 0.000–4.000)

## 2012-07-06 LAB — VITAMIN B12: Vitamin B-12: 253 pg/mL (ref 211–911)

## 2012-07-06 LAB — GFR: EGFR: >60

## 2012-07-06 LAB — FOLATE: Folate: 10.3 ng/mL

## 2012-07-06 LAB — TSH: TSH: 2 u[IU]/mL (ref 0.35–4.94)

## 2012-07-07 LAB — HEMOGLOBIN A1C: Hemoglobin A1C: 6.4 % — ABNORMAL HIGH (ref 0.0–6.0)

## 2012-09-13 ENCOUNTER — Ambulatory Visit
Admission: RE | Admit: 2012-09-13 | Discharge: 2012-09-13 | Disposition: A | Discharge status: Home / Self Care | Payer: Medicare Other | Source: Ambulatory Visit | Attending: Internal Medicine | Admitting: Internal Medicine

## 2012-11-03 NOTE — Discharge Summary (Unsigned)
[name], [name]      MEDICAL RECORD NUMBER:         [id number]            ATTENDING PHYSICIAN:           Ubaldo Glassing, MD      DATE OF ADMISSION:             09/25/2004      DATE OF DISCHARGE:             09/30/2004            HISTORY OF PRESENT ILLNESS:  Please refer to the chart for pertinent      details.            HOSPITAL COURSE:  The patient was admitted secondary to intractable nausea      and vomiting with severe abdominal pain and cramping and inability to      tolerate fluid intake. The patient was given IV fluid. Stool cultures were      obtained which were unremarkable. The patient had no evidence of gross      bleeding.  The patient was started on empiric Flagyl.  The patient's      diabetes was in good control.  The cause of the patient's enteritis was not      evident, and the workup was negative. The patient was discharged without      further complications.            FINAL DIAGNOSES:      1.    Intractable nausea and vomiting.      2.    Volume depletion disorder.      3.    Acute gastroenteritis.      4.    Diabetes mellitus type 2.      5.    Febrile illness.                  ___________________________________        Date Signed: _______________      Ubaldo Glassing, MD            JKK:amw:SC      D:    12/08/2004      T:    12/11/2004      #:    [id number]      N:    [id number]            cc:   Ubaldo Glassing, MD

## 2012-11-03 NOTE — Discharge Summary (Signed)
[name], [name]      MEDICAL RECORD NUMBER:         [id number]            ATTENDING PHYSICIAN:           Alanson Puls, MD      DATE OF ADMISSION:             01/31/2005      DATE OF DISCHARGE:             02/02/2005            ADMITTING DIAGNOSES:  Acute gastroenteritis.            SECONDARY DIAGNOSES:      1. Volume depletion disorder.      2. Leukocytosis.      3. Diabetes.      4. Cholesterol.            OPERATIONS:  There were no operations.            PROCEDURES:  There were no procedures.            CONSULTATIONS:  There were no consultations.            HISTORY OF PRESENT ILLNESS:  The patient is a 80 year old male with a      history of diabetes and cholesterol who presented to the emergency room      with symptoms of nausea, vomiting, and abdominal pain associated with      diarrhea, which he was unable to tolerate.  He was noted to have mild      leukocytosis.  His blood sugar was 214.            PAST MEDICAL HISTORY:  The patient has a history of diabetes and      cholesterol.  There is no prior cardiac history.  No history of asthma or      high blood pressure.            MEDICATIONS:  The patient does not recall what medicine he takes.  He      believes that it may be Actos, occasional aspirin, and a cholesterol      medicine, which he also does not recall.            ALLERGIES:  The patient has no known allergies.            SOCIAL HISTORY:  Positive for tobacco.  He denies alcohol.  The patient is      retired.  He had been in the [employer] and he had worked as a Engineer, agricultural, as      well.            FAMILY HISTORY:  Both of his parents are deceased.  Medical conditions are      unknown.            PHYSICAL EXAMINATION:  Temperature 97.8, blood pressure 136/71, heart rate      101, and respirations 20.  The patient is an alert gentleman in no acute      distress.  Head, ears, eyes, nose, and throat examination revealed sclera      non-icteric.  The oral mucosa are moist.  Neck  is supple.  No jugular vein      distention.  No thyromegaly.  Lungs are clear to  auscultation.      Cardiovascular examination revealed S1 and S2 with regular rhythm.  Abdomen      is soft.  There is diffuse tenderness.  No hepatosplenomegaly.  Extremities      are non-tender.  There is no edema.  Neurologic examination revealed no      focal deficits.            LABORATORY STUDIES:  White count 11.4, hemoglobin 14.5, hematocrit 41.7,      platelets 224, sodium 138, potassium 4.1, chloride 105, bicarb 22, BUN 26,      creatinine 0.9, glucose 178, calcium 9.5, total protein 7.7, albumin 4.6,      total bilirubin 0.6, alkaline phosphatase 79, ALT 16, amylase 91, lipase      27, and AST 21.  Urinalysis showed specific gravity 1.026, pH 8, and 7      white cells.            DIAGNOSTIC STUDIES:  Chest x-ray showed no acute disease.            HOSPITAL COURSE:  The patient received antacid and antiemetics as needed.      He was hydrated with intravenous fluids.  His symptoms steadily improved      and resolved completely.  At the time of discharge, he was tolerating oral      diet and he was afebrile and stable.  He was unable to recall his regular      outpatient medications and he did admit to non-compliance with medications.      The importance of compliance was discussed with the patient at length.            DISCHARGE INSTRUCTIONS:      1. The patient was instructed to resume medications prior to admission.  He            was also instructed to call if any refills may be needed.      2. He will be calling for regular follow-up appointment with regular            provider.      3. Diet is diabetic diet.      4. Activities are as tolerated.            DISPOSITION:  The patient is discharged home.                  Electronic Signing MD: Alanson Puls, MD            W C:amw:KRO      D:    02/02/2005      T:    02/03/2005      #:    [id number]      N:    [id number]            cc:   Alanson Puls, MD

## 2012-12-07 ENCOUNTER — Ambulatory Visit
Admission: RE | Admit: 2012-12-07 | Discharge: 2012-12-07 | Disposition: A | Discharge status: Home / Self Care | Payer: Medicare Other | Source: Ambulatory Visit | Attending: Internal Medicine | Admitting: Internal Medicine

## 2012-12-07 DIAGNOSIS — E785 Hyperlipidemia, unspecified: Secondary | ICD-10-CM | POA: Insufficient documentation

## 2012-12-07 DIAGNOSIS — D518 Other vitamin B12 deficiency anemias: Secondary | ICD-10-CM | POA: Insufficient documentation

## 2012-12-07 DIAGNOSIS — I10 Essential (primary) hypertension: Secondary | ICD-10-CM | POA: Insufficient documentation

## 2012-12-07 DIAGNOSIS — E119 Type 2 diabetes mellitus without complications: Secondary | ICD-10-CM | POA: Insufficient documentation

## 2012-12-07 DIAGNOSIS — E559 Vitamin D deficiency, unspecified: Secondary | ICD-10-CM | POA: Insufficient documentation

## 2012-12-07 LAB — COMPREHENSIVE METABOLIC PANEL
ALT: 15 U/L (ref 0–55)
AST (SGOT): 21 U/L (ref 5–34)
Albumin/Globulin Ratio: 1 (ref 0.9–2.2)
Albumin: 3.4 g/dL — ABNORMAL LOW (ref 3.5–5.0)
Alkaline Phosphatase: 88 U/L (ref 40–150)
BUN: 19 mg/dL (ref 8.0–20.0)
Bilirubin, Total: 0.4 mg/dL (ref 0.1–1.2)
CO2: 25 mEq/L (ref 21–30)
Calcium: 9.1 mg/dL (ref 7.9–10.6)
Chloride: 108 mEq/L (ref 96–109)
Creatinine: 1.1 mg/dL (ref 0.5–1.5)
Globulin: 3.5 g/dL (ref 2.0–3.7)
Glucose: 138 mg/dL — ABNORMAL HIGH (ref 70–100)
Potassium: 4.6 mEq/L (ref 3.5–5.3)
Protein, Total: 6.9 g/dL (ref 6.0–8.3)
Sodium: 139 mEq/L (ref 135–146)

## 2012-12-07 LAB — CBC AND DIFFERENTIAL
Basophils Absolute Automated: 0.02 10*3/uL (ref 0.00–0.20)
Basophils Automated: 0 %
Eosinophils Absolute Automated: 0.14 10*3/uL (ref 0.00–0.70)
Eosinophils Automated: 2 %
Hematocrit: 40.6 % — ABNORMAL LOW (ref 42.0–52.0)
Hgb: 12.8 g/dL — ABNORMAL LOW (ref 13.0–17.0)
Immature Granulocytes Absolute: 0.01 10*3/uL
Immature Granulocytes: 0 %
Lymphocytes Absolute Automated: 1.55 10*3/uL (ref 0.50–4.40)
Lymphocytes Automated: 27 %
MCH: 31.1 pg (ref 28.0–32.0)
MCHC: 31.5 g/dL — ABNORMAL LOW (ref 32.0–36.0)
MCV: 98.8 fL (ref 80.0–100.0)
MPV: 11.1 fL (ref 9.4–12.3)
Monocytes Absolute Automated: 0.33 10*3/uL (ref 0.00–1.20)
Monocytes: 6 %
Neutrophils Absolute: 3.68 10*3/uL (ref 1.80–8.10)
Neutrophils: 64 %
Nucleated RBC: 0 /100 WBC (ref 0–1)
Platelets: 209 10*3/uL (ref 140–400)
RBC: 4.11 10*6/uL — ABNORMAL LOW (ref 4.70–6.00)
RDW: 14 % (ref 12–15)
WBC: 5.72 10*3/uL (ref 3.50–10.80)

## 2012-12-07 LAB — LIPID PANEL
Cholesterol / HDL Ratio: 4.5 Index
Cholesterol: 188 mg/dL (ref 0–199)
HDL: 42 mg/dL (ref >40)
LDL Calculated: 124 mg/dL — ABNORMAL HIGH (ref 0–99)
Triglycerides: 111 mg/dL (ref 34–149)
VLDL Calculated: 22 mg/dL (ref 10–40)

## 2012-12-07 LAB — HEMOLYSIS INDEX: Hemolysis Index: 5 Index (ref 0–9)

## 2012-12-07 LAB — GFR: EGFR: >60

## 2012-12-08 LAB — HEMOGLOBIN A1C: Hemoglobin A1C: 6.4 % — ABNORMAL HIGH (ref 0.0–6.0)

## 2013-02-13 ENCOUNTER — Ambulatory Visit
Admission: RE | Admit: 2013-02-13 | Discharge: 2013-02-13 | Disposition: A | Discharge status: Home / Self Care | Payer: Medicare Other | Source: Ambulatory Visit | Attending: Internal Medicine | Admitting: Internal Medicine

## 2013-02-13 DIAGNOSIS — D518 Other vitamin B12 deficiency anemias: Secondary | ICD-10-CM | POA: Insufficient documentation

## 2013-02-13 DIAGNOSIS — I10 Essential (primary) hypertension: Secondary | ICD-10-CM | POA: Insufficient documentation

## 2013-02-13 DIAGNOSIS — E785 Hyperlipidemia, unspecified: Secondary | ICD-10-CM | POA: Insufficient documentation

## 2013-02-13 DIAGNOSIS — E039 Hypothyroidism, unspecified: Secondary | ICD-10-CM | POA: Insufficient documentation

## 2013-02-13 DIAGNOSIS — E119 Type 2 diabetes mellitus without complications: Secondary | ICD-10-CM | POA: Insufficient documentation

## 2013-02-13 LAB — COMPREHENSIVE METABOLIC PANEL
ALT: 17 U/L (ref 0–55)
AST (SGOT): 20 U/L (ref 5–34)
Albumin/Globulin Ratio: 1 (ref 0.9–2.2)
Albumin: 3.6 g/dL (ref 3.5–5.0)
Alkaline Phosphatase: 86 U/L (ref 40–150)
BUN: 12 mg/dL (ref 8.0–20.0)
Bilirubin, Total: 0.5 mg/dL (ref 0.1–1.2)
CO2: 24 (ref 21–30)
Calcium: 9.1 mg/dL (ref 7.9–10.6)
Chloride: 107 (ref 96–109)
Creatinine: 1.1 mg/dL (ref 0.5–1.5)
Globulin: 3.6 g/dL (ref 2.0–3.7)
Glucose: 122 mg/dL — ABNORMAL HIGH (ref 70–100)
Potassium: 4.7 (ref 3.5–5.3)
Protein, Total: 7.2 g/dL (ref 6.0–8.3)
Sodium: 142 (ref 135–146)

## 2013-02-13 LAB — VITAMIN D,25 OH,TOTAL: Vitamin D, 25 OH, Total: 21 ng/mL — ABNORMAL LOW (ref 30–100)

## 2013-02-13 LAB — CBC AND DIFFERENTIAL
Basophils Absolute Automated: 0.01 (ref 0.00–0.20)
Basophils Automated: 0 %
Eosinophils Absolute Automated: 0.14 (ref 0.00–0.70)
Eosinophils Automated: 2 %
Hematocrit: 42.8 % (ref 42.0–52.0)
Hgb: 13.3 g/dL (ref 13.0–17.0)
Immature Granulocytes Absolute: 0.02
Immature Granulocytes: 0 %
Lymphocytes Absolute Automated: 1.33 (ref 0.50–4.40)
Lymphocytes Automated: 21 %
MCH: 31.3 pg (ref 28.0–32.0)
MCHC: 31.1 g/dL — ABNORMAL LOW (ref 32.0–36.0)
MCV: 100.7 fL — ABNORMAL HIGH (ref 80.0–100.0)
MPV: 11.1 fL (ref 9.4–12.3)
Monocytes Absolute Automated: 0.31 (ref 0.00–1.20)
Monocytes: 5 %
Neutrophils Absolute: 4.42 (ref 1.80–8.10)
Neutrophils: 71 %
Nucleated RBC: 0 (ref 0–1)
Platelets: 205 (ref 140–400)
RBC: 4.25 — ABNORMAL LOW (ref 4.70–6.00)
RDW: 14 % (ref 12–15)
WBC: 6.23 (ref 3.50–10.80)

## 2013-02-13 LAB — RETICULOCYTES
Immature Platelet Fraction: 2.8 % (ref 0.9–11.2)
Immature Retic Fract: 7.6 % (ref 2.3–13.4)
Reticulocyte Count Absolute: 0.037 (ref 0.0235–0.1500)
Reticulocyte Count Automated: 0.9 % (ref 0.5–2.5)
Reticulocyte Hemoglobin: 36.5 pg (ref 28.2–36.6)

## 2013-02-13 LAB — HEMOGLOBIN A1C: Hemoglobin A1C: 6.7 % — ABNORMAL HIGH (ref 0.0–6.0)

## 2013-02-13 LAB — LIPID PANEL
Cholesterol / HDL Ratio: 4.5
Cholesterol: 184 mg/dL (ref 0–199)
HDL: 41 mg/dL (ref >40)
LDL Calculated: 123 mg/dL — ABNORMAL HIGH (ref 0–99)
Triglycerides: 98 mg/dL (ref 34–149)
VLDL Calculated: 20 mg/dL (ref 10–40)

## 2013-02-13 LAB — IRON PROFILE
Iron Saturation: 30 % (ref 15–50)
Iron: 111 ug/dL (ref 40–160)
TIBC: 368 ug/dL (ref 261–462)
UIBC: 257 ug/dL (ref 126–382)

## 2013-02-13 LAB — PSA: Prostate Specific Antigen, Total: 0.741 ng/mL (ref 0.000–4.000)

## 2013-02-13 LAB — URIC ACID: Uric acid: 5 mg/dL (ref 3.4–7.2)

## 2013-02-13 LAB — GFR: EGFR: >60

## 2013-02-13 LAB — TSH: TSH: 1.86 (ref 0.35–4.94)

## 2013-02-13 LAB — HEMOLYSIS INDEX: Hemolysis Index: 4 (ref 0–9)

## 2013-02-13 LAB — FERRITIN: Ferritin: 17.48 ng/mL — ABNORMAL LOW (ref 21.80–274.70)

## 2013-06-04 ENCOUNTER — Other Ambulatory Visit: Payer: Self-pay | Admitting: Internal Medicine

## 2013-06-05 ENCOUNTER — Other Ambulatory Visit: Payer: Self-pay | Admitting: Internal Medicine

## 2013-06-05 DIAGNOSIS — M5430 Sciatica, unspecified side: Secondary | ICD-10-CM

## 2013-06-07 ENCOUNTER — Ambulatory Visit
Admission: RE | Admit: 2013-06-07 | Discharge: 2013-06-07 | Disposition: A | Discharge status: Home / Self Care | Payer: Medicare Other | Source: Ambulatory Visit | Attending: Internal Medicine | Admitting: Internal Medicine

## 2013-06-07 ENCOUNTER — Other Ambulatory Visit: Payer: Self-pay | Admitting: Internal Medicine

## 2013-06-07 DIAGNOSIS — M4802 Spinal stenosis, cervical region: Secondary | ICD-10-CM | POA: Insufficient documentation

## 2013-06-07 DIAGNOSIS — M47812 Spondylosis without myelopathy or radiculopathy, cervical region: Secondary | ICD-10-CM | POA: Insufficient documentation

## 2013-06-07 DIAGNOSIS — M47817 Spondylosis without myelopathy or radiculopathy, lumbosacral region: Secondary | ICD-10-CM | POA: Insufficient documentation

## 2013-06-07 DIAGNOSIS — M5412 Radiculopathy, cervical region: Secondary | ICD-10-CM

## 2013-06-07 DIAGNOSIS — M5430 Sciatica, unspecified side: Secondary | ICD-10-CM

## 2013-07-27 ENCOUNTER — Ambulatory Visit
Admission: RE | Admit: 2013-07-27 | Discharge: 2013-07-27 | Disposition: A | Discharge status: Home / Self Care | Payer: Medicare Other | Source: Ambulatory Visit | Attending: Internal Medicine | Admitting: Internal Medicine

## 2013-07-27 DIAGNOSIS — E039 Hypothyroidism, unspecified: Secondary | ICD-10-CM | POA: Insufficient documentation

## 2013-07-27 DIAGNOSIS — I10 Essential (primary) hypertension: Secondary | ICD-10-CM | POA: Insufficient documentation

## 2013-07-27 DIAGNOSIS — R350 Frequency of micturition: Secondary | ICD-10-CM | POA: Insufficient documentation

## 2013-07-27 DIAGNOSIS — E559 Vitamin D deficiency, unspecified: Secondary | ICD-10-CM | POA: Insufficient documentation

## 2013-07-27 DIAGNOSIS — E538 Deficiency of other specified B group vitamins: Secondary | ICD-10-CM | POA: Insufficient documentation

## 2013-07-27 DIAGNOSIS — E785 Hyperlipidemia, unspecified: Secondary | ICD-10-CM | POA: Insufficient documentation

## 2013-07-27 DIAGNOSIS — M2550 Pain in unspecified joint: Secondary | ICD-10-CM | POA: Insufficient documentation

## 2013-07-27 DIAGNOSIS — M129 Arthropathy, unspecified: Secondary | ICD-10-CM | POA: Insufficient documentation

## 2013-07-27 DIAGNOSIS — N419 Inflammatory disease of prostate, unspecified: Secondary | ICD-10-CM | POA: Insufficient documentation

## 2013-07-27 DIAGNOSIS — IMO0001 Reserved for inherently not codable concepts without codable children: Secondary | ICD-10-CM | POA: Insufficient documentation

## 2013-07-27 DIAGNOSIS — R5383 Other fatigue: Secondary | ICD-10-CM | POA: Insufficient documentation

## 2013-07-27 DIAGNOSIS — R5381 Other malaise: Secondary | ICD-10-CM | POA: Insufficient documentation

## 2013-07-27 DIAGNOSIS — R109 Unspecified abdominal pain: Secondary | ICD-10-CM | POA: Insufficient documentation

## 2013-07-27 LAB — LIPID PANEL
Cholesterol / HDL Ratio: 5.4
Cholesterol: 199 mg/dL (ref 0–199)
HDL: 37 mg/dL — ABNORMAL LOW (ref >40)
LDL Calculated: 141 mg/dL — ABNORMAL HIGH (ref 0–99)
Triglycerides: 106 mg/dL (ref 34–149)
VLDL Calculated: 21 mg/dL (ref 10–40)

## 2013-07-27 LAB — COMPREHENSIVE METABOLIC PANEL
ALT: 11 U/L (ref 0–55)
AST (SGOT): 15 U/L (ref 5–34)
Albumin/Globulin Ratio: 0.8 — ABNORMAL LOW (ref 0.9–2.2)
Albumin: 3.2 g/dL — ABNORMAL LOW (ref 3.5–5.0)
Alkaline Phosphatase: 88 U/L (ref 40–150)
BUN: 15 mg/dL (ref 8.0–20.0)
Bilirubin, Total: 0.6 mg/dL (ref 0.1–1.2)
CO2: 25 (ref 21–30)
Calcium: 9.3 mg/dL (ref 7.9–10.6)
Chloride: 104 (ref 96–109)
Creatinine: 1.1 mg/dL (ref 0.5–1.5)
Globulin: 3.9 g/dL — ABNORMAL HIGH (ref 2.0–3.7)
Glucose: 184 mg/dL — ABNORMAL HIGH (ref 70–100)
Potassium: 4.5 (ref 3.5–5.3)
Protein, Total: 7.1 g/dL (ref 6.0–8.3)
Sodium: 139 (ref 135–146)

## 2013-07-27 LAB — GFR: EGFR: >60

## 2013-07-27 LAB — CBC AND DIFFERENTIAL
Basophils Absolute Automated: 0.01 (ref 0.00–0.20)
Basophils Automated: 0 %
Eosinophils Absolute Automated: 0.18 (ref 0.00–0.70)
Eosinophils Automated: 3 %
Hematocrit: 41.2 % — ABNORMAL LOW (ref 42.0–52.0)
Hgb: 13.3 g/dL (ref 13.0–17.0)
Immature Granulocytes Absolute: 0
Immature Granulocytes: 0 %
Lymphocytes Absolute Automated: 1.07 (ref 0.50–4.40)
Lymphocytes Automated: 18 %
MCH: 31.1 pg (ref 28.0–32.0)
MCHC: 32.3 g/dL (ref 32.0–36.0)
MCV: 96.3 fL (ref 80.0–100.0)
MPV: 10.1 fL (ref 9.4–12.3)
Monocytes Absolute Automated: 0.36 (ref 0.00–1.20)
Monocytes: 6 %
Neutrophils Absolute: 4.4 (ref 1.80–8.10)
Neutrophils: 73 %
Nucleated RBC: 0 (ref 0–1)
Platelets: 343 (ref 140–400)
RBC: 4.28 — ABNORMAL LOW (ref 4.70–6.00)
RDW: 13 % (ref 12–15)
WBC: 6.02 (ref 3.50–10.80)

## 2013-07-27 LAB — LIPASE: Lipase: 16 U/L (ref 8–78)

## 2013-07-27 LAB — VITAMIN B12: Vitamin B-12: 1318 pg/mL — ABNORMAL HIGH (ref 211–911)

## 2013-07-27 LAB — T4, FREE: T4 Free: 1.03 ng/dL (ref 0.70–1.48)

## 2013-07-27 LAB — PROSTATE SPECIFIC ANTIGEN FREE/TOTA (SOFT)
PSA Free and Total Ratio: 0.453
PSA, Free: 0.562 ng/mL — ABNORMAL HIGH (ref 0.000–0.500)
Prostate Specific Antigen, Total: 1.241 ng/mL (ref 0.000–4.000)

## 2013-07-27 LAB — TSH: TSH: 2.25 (ref 0.35–4.94)

## 2013-07-27 LAB — AMYLASE: Amylase: 73 U/L (ref 25–125)

## 2013-07-27 LAB — URIC ACID: Uric acid: 4.9 mg/dL (ref 3.4–7.2)

## 2013-07-27 LAB — HEMOGLOBIN A1C: Hemoglobin A1C: 7.5 % — ABNORMAL HIGH (ref 0.0–6.0)

## 2013-07-27 LAB — HEMOLYSIS INDEX: Hemolysis Index: 6 (ref 0–9)

## 2013-07-28 LAB — URINALYSIS, REFLEX TO MICROSCOPIC EXAM IF INDICATED
Blood, UA: NEGATIVE
Glucose, UA: NEGATIVE
Leukocyte Esterase, UA: NEGATIVE
Nitrite, UA: NEGATIVE
Specific Gravity UA: 1.019 (ref 1.001–1.035)
Urine pH: 6 (ref 5.0–8.0)
Urobilinogen, UA: 0.2

## 2013-07-29 LAB — VITAMIN D 25 HYDROXY - SOFT
Vitamin D 25-OH D2: 21 ng/mL
Vitamin D 25-OH D3: 15 ng/mL
Vitamin D 25-OH Total: 36 ng/mL (ref 30–100)

## 2014-01-14 ENCOUNTER — Other Ambulatory Visit: Payer: Self-pay | Admitting: Internal Medicine

## 2014-01-14 DIAGNOSIS — S43422A Sprain of left rotator cuff capsule, initial encounter: Secondary | ICD-10-CM

## 2014-01-14 DIAGNOSIS — M5412 Radiculopathy, cervical region: Secondary | ICD-10-CM

## 2014-01-17 ENCOUNTER — Ambulatory Visit: Payer: Medicare Other

## 2014-01-17 ENCOUNTER — Ambulatory Visit
Admission: RE | Admit: 2014-01-17 | Discharge: 2014-01-17 | Disposition: A | Discharge status: Home / Self Care | Payer: Medicare Other | Source: Ambulatory Visit | Attending: Internal Medicine | Admitting: Internal Medicine

## 2014-01-17 DIAGNOSIS — M5412 Radiculopathy, cervical region: Secondary | ICD-10-CM

## 2014-01-17 DIAGNOSIS — M5030 Other cervical disc degeneration, unspecified cervical region: Secondary | ICD-10-CM | POA: Insufficient documentation

## 2014-01-17 DIAGNOSIS — G959 Disease of spinal cord, unspecified: Secondary | ICD-10-CM | POA: Insufficient documentation

## 2014-02-03 ENCOUNTER — Emergency Department
Admission: EM | Admit: 2014-02-03 | Discharge: 2014-02-03 | Disposition: A | Discharge status: Home / Self Care | Payer: Medicare Other | Attending: Pediatric Emergency Medicine | Admitting: Pediatric Emergency Medicine

## 2014-02-03 ENCOUNTER — Emergency Department: Payer: Medicare Other

## 2014-02-03 DIAGNOSIS — S058X9A Other injuries of unspecified eye and orbit, initial encounter: Secondary | ICD-10-CM | POA: Insufficient documentation

## 2014-02-03 DIAGNOSIS — H269 Unspecified cataract: Secondary | ICD-10-CM | POA: Insufficient documentation

## 2014-02-03 DIAGNOSIS — H109 Unspecified conjunctivitis: Secondary | ICD-10-CM | POA: Insufficient documentation

## 2014-02-03 DIAGNOSIS — E119 Type 2 diabetes mellitus without complications: Secondary | ICD-10-CM | POA: Insufficient documentation

## 2014-02-03 DIAGNOSIS — F17200 Nicotine dependence, unspecified, uncomplicated: Secondary | ICD-10-CM | POA: Insufficient documentation

## 2014-02-03 DIAGNOSIS — X58XXXA Exposure to other specified factors, initial encounter: Secondary | ICD-10-CM | POA: Insufficient documentation

## 2014-02-03 DIAGNOSIS — S0501XA Injury of conjunctiva and corneal abrasion without foreign body, right eye, initial encounter: Secondary | ICD-10-CM

## 2014-02-03 DIAGNOSIS — H548 Legal blindness, as defined in USA: Secondary | ICD-10-CM | POA: Insufficient documentation

## 2014-02-03 HISTORY — DX: Reserved for inherently not codable concepts without codable children: IMO0001

## 2014-02-03 HISTORY — DX: Type 2 diabetes mellitus without complications: E11.9

## 2014-02-03 MED ORDER — ERYTHROMYCIN 5 MG/GM OP OINT
TOPICAL_OINTMENT | Freq: Once | OPHTHALMIC | Status: AC
Start: 2014-02-03 — End: 2014-02-03
  Filled 2014-02-03: qty 3.5

## 2014-02-03 MED ORDER — PROPARACAINE HCL 0.5 % OP SOLN
2.0000 [drp] | Freq: Once | OPHTHALMIC | Status: AC
Start: 2014-02-03 — End: 2014-02-03
  Administered 2014-02-03: 2 [drp] via OPHTHALMIC
  Filled 2014-02-03: qty 15

## 2014-02-03 MED ORDER — ACETAMINOPHEN-CODEINE 300-30 MG PO TABS
1.0000 | ORAL_TABLET | Freq: Four times a day (QID) | ORAL | Status: DC | PRN
Start: 2014-02-03 — End: 2015-03-05

## 2014-02-03 NOTE — ED Notes (Signed)
Pt c/o pain to R eye x 3 days, worse today +itching +watering.  Sclera red.  Pt states that he had a lens implant on R eye in 2002, has been blind in R eye since then.

## 2014-02-03 NOTE — Discharge Instructions (Signed)
Conjunctivitis     You have been diagnosed with conjunctivitis (also called "pink eye").     Conjunctivitis, also known as pink eye, is an inflammation of the conjunctiva (the thin coverings of the white part of the eye and the insides of the eyelids). It is caused by many different things, including viruses and bacteria, allergies, and even by chemicals or particles of "junk" that irritate the eye. Viruses are the most common cause.     Symptoms of conjunctivitis include pink eye, redness, drainage, foreign body sensation, and lid swelling. You may also notice matting or sticking of your eyelids in the morning.     Conjunctivitis can be highly contagious (it can be easily spread to others). You SHOULD NOT share hygienic items (towels, make-up, tissues), or clothing items. Be sure to wash your hands several times a day and avoid touching your eyes.     Treatment of bacterial conjunctivitis includes warm compresses and topical ophthalmic antibiotics. Medications are generally used for 5-7 days.     You SHOULD NOT wear contact lenses while you have conjunctivitis. Wait for 48 hours after the infection has completely cleared up before using them again.     There is no specific follow-up required, unless you develop any of the problems listed below, or fail to improve as expected. It is always a good idea to get rechecked by your eye doctor if possible.     YOU SHOULD SEEK MEDICAL ATTENTION IMMEDIATELY, EITHER HERE OR AT THE NEAREST EMERGENCY DEPARTMENT, IF ANY OF THE FOLLOWING OCCURS:  · Increasing eye pain.  · Vision problems.  · Photophobia (light bothering your eyes).  · Failure to improve over several days, or worsening symptoms at any time.

## 2014-02-03 NOTE — ED Provider Notes (Signed)
[facility] Emergency Department   HISTORY AND PHYSICAL EXAM     Physician/Midlevel provider first contact with patient: 02/03/14 1649       Date Time: 02/04/2014 12:26 AM  Patient Name: [name]  Attending Physician: Jocelyn Lamer, MD  Mid-Level: Rosezetta Schlatter, PA-C    History of Presenting Illness:   [name] is a 80 y.o. male     Chief Complaint: right eye pain  History obtained from: Patient.  Onset/Duration: 3 days  Quality: stinging  Severity: severe  Aggravating Factors: EOMs  Alleviating Factors: none  Associated Symptoms: lacrimation, drainage/crust in a.m, rhinorrhea, sinus congestion, sneezing   Narrative/Additional Historical Findings:81 y.o. male who presents with three day hx of right eye stinging/burning with excessive lacrimation.  He reports seasonal allergies and has had similar symptoms in the past with season changes.  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  No known trauma or injury, no foreign body sensation.    PCP: [name], MD    Past Medical History:     Past Medical History   Diagnosis Date   . No diagnosis    . Diabetes mellitus        Past Surgical History:     Past Surgical History   Procedure Date   . Appendectomy        Family History:   History reviewed. No pertinent family history.    Social History:     History     Social History   . Marital Status: Single     Spouse Name: N/A     Number of Children: N/A   . Years of Education: N/A     Social History Main Topics   . Smoking status: Current Some Day Smoker     Types: Cigarettes   . Smokeless tobacco: Not on file   . Alcohol Use: No   . Drug Use: No   . Sexually Active: Not on file     Other Topics Concern   . Not on file     Social History Narrative   . No narrative on file       Allergies:   No Known Allergies    Medications:     Patient's Medications   New Prescriptions    ACETAMINOPHEN-CODEINE (TYLENOL/CODEINE #3) 300-30 MG PER TABLET    Take  1-2 tablets by mouth every 6 (six) hours as needed for Pain (pain).   Previous Medications    GLIMEPIRIDE (AMARYL) 2 MG TABLET    Take 2 mg by mouth every morning before breakfast.    METFORMIN (GLUCOPHAGE) 850 MG TABLET    Take 850 mg by mouth 2 (two) times daily with meals.   Modified Medications    No medications on file   Discontinued Medications    No medications on file       Review of Systems:     Review of Systems   Constitutional: Negative for fever, chills and malaise/fatigue.   HENT: Positive for congestion. Negative for ear discharge, ear pain and sore throat.         +rhinorrhea   Eyes: Positive for pain, discharge and redness. Negative for photophobia.   Respiratory: Negative for cough and shortness of breath.    Cardiovascular: Negative for chest pain.   Gastrointestinal: Negative for nausea and vomiting.   Skin: Negative for rash.   Neurological: Positive for headaches. Negative for weakness.   Endo/Heme/Allergies: Positive for environmental  allergies.         Physical Exam:     Filed Vitals:    02/03/14 1640 02/03/14 1754   BP: 114/56 119/61   Pulse: 92 85   Temp: 96.8 F (36 C)    Resp: 18 18   SpO2: 97% 98%     Physical Exam   Nursing note and vitals reviewed.  Constitutional:        Well developed, well nourished in moderate distress   HENT:   Head: Normocephalic and atraumatic.   Right Ear: External ear normal.   Left Ear: External ear normal.   Nose: Mucosal edema present.   Mouth/Throat: Uvula is midline, oropharynx is clear and moist and mucous membranes are normal. No oropharyngeal exudate.   Eyes: Right eye exhibits chemosis and discharge (clear). Right conjunctiva is injected.   Slit lamp exam:       The right eye shows fluorescein uptake.             Labs:     Results     ** No Results found for the last 24 hours. **          Rads:     Radiology Results (24 Hour)     ** No Results found for the last 24 hours. **          Medical Decision Making:   I am the primary provider for this  patient encounter  Vital signs, nursing notes, past medical, surgical, social, and family history reviewed    Filed Vitals:    02/03/14 1640 02/03/14 1754   BP: 114/56 119/61   Pulse: 92 85   Temp: 96.8 F (36 C)    Resp: 18 18   SpO2: 97% 98%     ED Course:  Patient reports improvement in pain after proparacaine and erythromycin ointment.    Provider Notes: Corneal abrasion likely 2/2 rubbing eyes, no branching of florescein visualized.  Discussed diagnosis, treatment, and follow-up plans with patient who agrees and verbalizes understanding.  Ophthalmology referral provided.  Return precautions discussed.    Assessment/Plan:     1. Conjunctivitis of right eye    2. Corneal abrasion, right, initial encounter        D/w Jocelyn Lamer, MD      CHART OWNERSHIP: I, Rosezetta Schlatter, PA-C, am the primary clinician of record.    Signed by: Rosezetta Schlatter, PA-C.              Rosezetta Schlatter [place], [place]  02/04/14 0030

## 2014-02-05 ENCOUNTER — Ambulatory Visit
Admission: RE | Admit: 2014-02-05 | Discharge: 2014-02-05 | Disposition: A | Discharge status: Home / Self Care | Payer: Medicare Other | Source: Ambulatory Visit | Attending: Internal Medicine | Admitting: Internal Medicine

## 2014-02-05 DIAGNOSIS — X58XXXA Exposure to other specified factors, initial encounter: Secondary | ICD-10-CM | POA: Insufficient documentation

## 2014-02-05 DIAGNOSIS — S139XXA Sprain of joints and ligaments of unspecified parts of neck, initial encounter: Secondary | ICD-10-CM | POA: Insufficient documentation

## 2014-02-05 NOTE — PT Eval Note (Signed)
[facility]  Physical Therapy Treatment Note    Patient:  [name] MRN#:  [id number]  Date: 02/05/2014    Patient arrived for Physical Therapy appointment on 02/05/2014. Patient demonstrated redness, rhinorrhea and reported congestion in right eye. Patient reported seeing MD for possible conjunctivitis the previous day. Recommended patient hold at this time until symptoms improve or prescribed medication. Rescheduled patient for next available PT evaluation.    Frederica Kuster, DPT  [facility]  License Number: [id number]  Phone: [phone]

## 2014-02-14 ENCOUNTER — Ambulatory Visit
Admission: RE | Admit: 2014-02-14 | Discharge: 2014-02-14 | Disposition: A | Discharge status: Home / Self Care | Payer: Medicare Other | Source: Ambulatory Visit
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
## 2014-02-21 ENCOUNTER — Ambulatory Visit
Admission: RE | Admit: 2014-02-21 | Discharge: 2014-02-21 | Disposition: A | Discharge status: Home / Self Care | Payer: Medicare Other | Source: Ambulatory Visit

## 2014-02-21 NOTE — PT Plan of Care Note (Signed)
[facility]  Physical Therapy Treatment Note      Patient:   [name]                                                        MRN#: [id number]  Date: 02/21/2014  # of Visits: 2     Time of treatment:   Start Time:      830                 Stop Time: 908    Subjective: Pain Rating: "My neck hurts. The pain does not spread".       Objective/Measurements:   02/21/14     UBE X 5 min (FWD/BWD) with MHP to neck     Upper cervical flexion "chin tucks" supine 2 x 10 x 5"     Deep cervical isometrics- rotation with manual resistance Next visit      Deep cervical stabilization- rotation with chin tuck- "3 finger between chin and chest" supine 2 x 10 B                                           Functional Activities: n/a        Manual Therapy: Suboccipital release in supine. Cervical paraspinal upglides, STM grade III, to decrease tautness.         Patient Education: Updated HEP provided with handout. Pt verbalized understanding.      Current HEP Dosage   Supine chin tucks 2 x 10 x 5", bid   Deep cervical stabilization- rotation with chin tuck- "3 finger between chin and chest" 2 x 10 B, bid                   Other:      Assessment: Pt presents with taut cervical paraspinals and subocciptial region, requiring manual therapy, with pt reporting relief after manual therapy. Initiated cervical stabilization therex with focus on deep cervical musculature. Pt required vc/tc on proper technique to avoid superficial muscle activation when performing chin tucks (SCM). Pt with muscle fatigue during therex, requiring rest breaks, resulting in extra time required to complete exercises. Pt requires physical therapy to improve cervical stability and pain.       Progress towards goals:  Short Term Goals: (in 1 weeks)   1. Pt demo Independent with HEP and symptom management- HEP provided.   2. Pt will demo improved posture with decreased forward head position.     Long Term Goals: (in 6 weeks)   1. Pt will demo NDI score of  <20% to allow for decreased disability 2/2 neck symptoms.   2. Pt will report consistent uninterrupted sleep at night 2/2 decreased pain.   3. Pt will report worst pain rated at <7/10   4. Pt will return to personal fitness regimen.      Plan:  Cont POC:    Charges Minutes   Ther ex 24   Ther act    Performance Test    Gait training    Neuro Re-Ed    Manual [address]   [place]    [place]      [name], PT, DPT  [place] LIC #[id number]  [name]  [phone]

## 2014-02-22 ENCOUNTER — Ambulatory Visit
Admission: RE | Admit: 2014-02-22 | Discharge: 2014-02-22 | Disposition: A | Discharge status: Home / Self Care | Payer: Medicare Other | Source: Ambulatory Visit

## 2014-02-22 NOTE — PT Plan of Care Note (Addendum)
[facility]  Physical Therapy Treatment Note    Patient:   [name]                                                        MRN#: [id number]  Date: 02/22/2014  # of Visits: 3    Time of treatment:   Start Time:  1430 PM                Stop Time:  1530 PM     Subjective: Pain Rating: "My neck is sore and stiff. It feels like it never gets unstuck at times." " I want to avoid surgery."     Objective/Measurements:   02/21/14 4/24    UBE X 5 min (FWD/BWD) with MHP to neck X 5 min (FWD/BWD)    Upper cervical flexion "chin tucks" supine 2 x 10 x 5" supine 2 x 10 x 5"    Deep cervical isometrics- rotation with manual resistance Next visit  Next visit ->    Deep cervical stabilization- rotation with chin tuck- "3 finger between chin and chest" supine 2 x 10 B Supine 2 x 10 w/ 3" hold    Cervical Sidebending  Supine 2 x 10 w/ 3" hold                                    Functional Activities: n/a    Manual Therapy:  In Supine: Suboccipital release in supine. Cervical paraspinal upglides, STM grade III, to decrease tautness.     Patient Education: Updated HEP provided with handout. Pt verbalized understanding. Education on posture with therex to prevent shoulder elevation with therex.    Current HEP Dosage   Supine chin tucks 2 x 10 x 5", bid   Deep cervical stabilization- rotation with chin tuck- "3 finger between chin and chest" 2 x 10 B, bid                 Other:Have reviewed PT Eval for POC.     Assessment: Patient reported slight decrease in pain in B UT region after manual technqiues. Patient required mod cues for postural awareness with all therex. Pt requires physical therapy to improve cervical stability and pain.     Progress towards goals:  Short Term Goals: (in 1 weeks)   1. Pt demo Independent with HEP and symptom management- Progressing 4/24  2. Pt will demo improved posture with decreased forward head position.     Long Term Goals: (in 6 weeks)   1. Pt will demo NDI score of <20% to allow for  decreased disability 2/2 neck symptoms.   2. Pt will report consistent uninterrupted sleep at night 2/2 decreased pain.   3. Pt will report worst pain rated at <7/10   4. Pt will return to personal fitness regimen.    Plan: Cont w/ exercise progression  Cont POC:    Charges Minutes   Ther ex 30   Ther act 15   Performance Test    Gait training    Neuro Re-Ed    Manual 15   [place]    [place]      Zyniah Ferraiolo B. Wadie Lessen  [facility] Physical Therapist Assistant  [place] License: [id number]  [email or web address]  Department #: [phone]

## 2014-02-25 ENCOUNTER — Inpatient Hospital Stay
Admission: RE | Admit: 2014-02-25 | Discharge: 2014-02-25 | Disposition: A | Discharge status: Home / Self Care | Payer: Medicare Other | Source: Ambulatory Visit

## 2014-02-25 NOTE — PT Plan of Care Note (Signed)
[facility]  Physical Therapy Treatment Note      Patient:  [name] MRN#:  [id number]  Date: 02/25/2014      Patient did not show for Physical Therapy appointment on 02/25/2014. When called, pt reports having wrong time for appointment.     [name], PT, DPT  [place] LIC #[id number]  [name]  [phone]

## 2014-02-26 ENCOUNTER — Ambulatory Visit
Admission: RE | Admit: 2014-02-26 | Discharge: 2014-02-26 | Disposition: A | Discharge status: Home / Self Care | Payer: Medicare Other | Source: Ambulatory Visit

## 2014-02-26 NOTE — PT Plan of Care Note (Signed)
[facility]  Physical Therapy Treatment Note    Patient:   [name]                                                        MRN#: [id number]  Date: 02/26/2014  # of Visits: 4    Time of treatment:   Start Time:  1015 PM                Stop Time:  1055 PM     Subjective: Pain Rating: "My shoulder is much better. I still get neck pain."     Objective/Measurements:   02/21/14 4/24 02/26/14   UBE X 5 min (FWD/BWD) with MHP to neck X 5 min (FWD/BWD) X 5 min (FWD/BWD) with MHP to neck   Upper cervical flexion "chin tucks" supine 2 x 10 x 5" supine 2 x 10 x 5" supine 2 x 10 x 5"   Deep cervical isometrics- rotation with manual resistance Next visit  Next visit -> 5 x 5" B    Deep cervical stabilization- rotation with chin tuck- "3 finger between chin and chest" supine 2 x 10 B Supine 2 x 10 w/ 3" hold Supine 2 x 10 w/ 3" hold   Cervical Sidebending  Supine 2 x 10 w/ 3" hold Seated x 2 B                                   Functional Activities: n/a    Manual Therapy:   In Supine: Suboccipital release in supine. Cervical paraspinal upglides, STM grade III, to decrease tautness. Cervical PROM sidebending.     Patient Education: Pt educated to cont HEP. Pt educated on expecting soreness due to initiation of new isometric cervical therex.    Current HEP Dosage   Supine chin tucks 2 x 10 x 5", bid   Deep cervical stabilization- rotation with chin tuck- "3 finger between chin and chest" 2 x 10 B, bid                 Other:    Assessment: Initiated deep cervical isometrics with manual resistance with pt having mild pain, requiring vc to perform sub-maximal contraction to avoid pain threshold. Pt with decreased cervical sidebend ROM, requiring manual therapy and ROM therex. Pt cont to have taut suboccipital and paraspinal musculature.  Pt requires physical therapy to improve cervical stability and pain.     Progress towards goals:  Short Term Goals: (in 1 weeks)   1. Pt demo Independent with HEP and symptom  management- Progressing 4/24  2. Pt will demo improved posture with decreased forward head position.     Long Term Goals: (in 6 weeks)   1. Pt will demo NDI score of <20% to allow for decreased disability 2/2 neck symptoms.   2. Pt will report consistent uninterrupted sleep at night 2/2 decreased pain.   3. Pt will report worst pain rated at <7/10   4. Pt will return to personal fitness regimen.    Plan: Cont w/ exercise progression  Cont POC:    Charges Minutes   Ther ex 24   Ther act    Performance Test    Gait training    Neuro Re-Ed  Manual [address]   [place]    [place]      [name], [place], DPT  [place] LIC #[id number]  [email or web address] .[phone]

## 2014-03-01 ENCOUNTER — Ambulatory Visit
Admission: RE | Admit: 2014-03-01 | Discharge: 2014-03-01 | Disposition: A | Discharge status: Home / Self Care | Payer: Medicare Other | Source: Ambulatory Visit | Attending: Internal Medicine | Admitting: Internal Medicine

## 2014-03-01 DIAGNOSIS — M48062 Spinal stenosis, lumbar region with neurogenic claudication: Secondary | ICD-10-CM | POA: Insufficient documentation

## 2014-03-01 DIAGNOSIS — M5020 Other cervical disc displacement, unspecified cervical region: Secondary | ICD-10-CM | POA: Insufficient documentation

## 2014-03-01 NOTE — PT Plan of Care Note (Signed)
[facility]  Physical Therapy Treatment Note    Patient:   [name]                                                        MRN#: [id number]  Date: 03/01/2014  # of Visits: 5    Time of treatment:   Start Time:  1130 PM                Stop Time:  1210 PM     Subjective: Pain Rating: "I have some pain today in my neck, but my shoulder is much better".     Objective/Measurements:   02/21/14 4/24 02/26/14 03/01/14   UBE X 5 min (FWD/BWD) with MHP to neck X 5 min (FWD/BWD) X 5 min (FWD/BWD) with MHP to neck X 5 min (FWD/BWD) with MHP to neck   Upper cervical flexion "chin tucks" supine 2 x 10 x 5" supine 2 x 10 x 5" supine 2 x 10 x 5" supine 2 x 10 x 5"   Deep cervical isometrics- rotation with manual resistance Next visit  Next visit -> 5 x 5" B  Sitting with self resistance 5 x 5" B   Deep cervical stabilization- rotation with chin tuck- "3 finger between chin and chest" supine 2 x 10 B Supine 2 x 10 w/ 3" hold Supine 2 x 10 w/ 3" hold Supine 2 x 10 w/ 3" hold   Cervical Sidebending  Supine 2 x 10 w/ 3" hold Seated x 2 B    Seated cervical extension head faced down    X 10   Seated Cervical rotation with head faced down    X 10 B                          Functional Activities: n/a    Manual Therapy:   In Supine: Suboccipital release in supine. Cervical paraspinal upglides, STM grade III, to decrease tautness. Cervical PROM sidebending.     Patient Education: Updated HEP provided with handout. Pt educated to avoid painful range.  Pt verbalized understanding.      Current HEP Dosage   Supine chin tucks 2 x 10 x 5", bid   Deep cervical stabilization- rotation with chin tuck- "3 finger between chin and chest" 2 x 10 B, bid   Seated Cervical extension with head faced down 2 x 10, QD   Seated Cervical rotation with head faced down 2 x 10 B, QD         Other:    Assessment: Initiated cervical extension and rotation with head faced down to improve cervical stability, with pt requiring vc to avoid pain threshold.  Pt with increased pain today, required increased rest breaks. Pt cont to c/o cervical pain, limiting ROM, but presents with much improved shoulder pain. Pt requires physical therapy to improve cervical stability and pain.     Progress towards goals:  Short Term Goals: (in 1 weeks)   1. Pt demo Independent with HEP and symptom management- Progressing 4/24  2. Pt will demo improved posture with decreased forward head position.     Long Term Goals: (in 6 weeks)   1. Pt will demo NDI score of <20% to allow for decreased disability 2/2 neck symptoms.  2. Pt will report consistent uninterrupted sleep at night 2/2 decreased pain.   3. Pt will report worst pain rated at <7/10   4. Pt will return to personal fitness regimen.    Plan: Cont w/ exercise progression  Cont POC:    Charges Minutes   Ther ex 25   Ther act    Performance Test    Gait training    Neuro Re-Ed    Manual [address]   [place]    [place]      [name], PT, DPT  [place] LIC #[id number]  [name]  [phone]

## 2014-03-05 ENCOUNTER — Inpatient Hospital Stay
Admission: RE | Admit: 2014-03-05 | Discharge: 2014-03-05 | Disposition: A | Discharge status: Home / Self Care | Payer: Medicare Other | Source: Ambulatory Visit

## 2014-03-05 NOTE — PT Plan of Care Note (Signed)
[facility]  Outpatient Therapy No Show Note      Patient:  [name] MRN#:  [id number]  Date: 03/05/2014      Patient showed up 40 minutes late for Physical Therapy appointment on 03/05/2014 reporting "times mixed up for appointment today." Patient unable to be seen due to tardiness and unable to be accomodated later in the day due to therapist's schedules. Patient agreeable to reschedule appointment. Reviewed schedule with patient. Next appointment on 03/07/14. Pt reported understanding.    According to our [facility] Physical Medicine Department's 24 hour cancellation policy,patient's notification of cancellation is consider a no show for 03/05/2014 appointment.   Pt was provided opportunity to reschedule appointment to meet patient's needs.         Drucie Ip Wadie Lessen  [facility] Physical Therapist Assistant  [place] License: [id number]  [email or web address]  Department #: [phone]    03/05/2014

## 2014-03-07 ENCOUNTER — Inpatient Hospital Stay: Admission: RE | Admit: 2014-03-07 | Payer: Medicare Other | Source: Ambulatory Visit

## 2014-03-07 NOTE — PT Plan of Care Note (Signed)
[facility]  Physical Therapy Treatment Note      Patient:  [name] MRN#:  [id number]  Date: 03/07/2014      Patient did not show for Physical Therapy appointment on 03/07/2014. Called pt and spoke with pt on phone. Per pt, pt is unable to come in for therapy until after 03/24/14. Notified front desk and canceled all appts until 03/24/14.    Lear Ng, [place]  [id number]  IAH  03/07/2014

## 2014-03-11 ENCOUNTER — Ambulatory Visit: Payer: Medicare Other

## 2014-03-13 ENCOUNTER — Ambulatory Visit: Payer: Medicare Other

## 2014-03-18 ENCOUNTER — Ambulatory Visit: Payer: Medicare Other

## 2014-03-21 ENCOUNTER — Ambulatory Visit: Payer: Medicare Other

## 2014-03-26 ENCOUNTER — Inpatient Hospital Stay
Admission: RE | Admit: 2014-03-26 | Discharge: 2014-03-26 | Disposition: A | Discharge status: Home / Self Care | Payer: Medicare Other | Source: Ambulatory Visit

## 2014-03-26 NOTE — PT Plan of Care Note (Signed)
[facility]  Physical Therapy Treatment Note      Patient:  [name] MRN#:  [id number]  Date: 03/26/2014      Patient did not show for Physical Therapy appointment on 03/26/2014.     [name], PT, DPT  [place] LIC #[id number]  [name]  [phone]

## 2014-03-28 ENCOUNTER — Inpatient Hospital Stay
Admission: RE | Admit: 2014-03-28 | Discharge: 2014-03-28 | Disposition: A | Discharge status: Home / Self Care | Payer: Medicare Other | Source: Ambulatory Visit

## 2014-03-28 NOTE — Discharge Summary (Signed)
[facility]   Physical Medicine and Rehabilitation  [address]  [place], [place] [address]  [phone]      PHYSICAL THERAPY DISCHARGE NOTE  Date:  03/28/2014    PATIENT:  [name]           MRN#: [id number]  DOB:  January 06, 1932  DIAGNOSIS:  Neck strain    DATES OF TREATMENT:   02/14/14 to   03/01/14         #VISITS:  5  # Cancel/No show: 4 (Pt did not show for appointments on 03/07/14, 03/26/14, 03/28/14)       Treatment Provided: therex, manual therapy, HEP.      Summary of Progress: Unable to assess final goals status due to pt not showing for final 3 appointments.       Goals:     Short Term Goals: (in 1 weeks)   1. Pt demo Independent with HEP and symptom management- Progressing 4/24  2. Pt will demo improved posture with decreased forward head position.     Long Term Goals: (in 6 weeks)   1. Pt will demo NDI score of <20% to allow for decreased disability 2/2 neck symptoms.   2. Pt will report consistent uninterrupted sleep at night 2/2 decreased pain.   3. Pt will report worst pain rated at <7/10   4. Pt will return to personal fitness regimen.    Recommendations:  D/C from PT due to consecutive no shows.         [name], PT, DPT  [place] LIC #[id number]  [name]  [phone]      03/28/2014

## 2014-03-28 NOTE — PT Plan of Care Note (Addendum)
[facility]  Physical Therapy Treatment Note      Patient:  [name] MRN#:  [id number]  Date: 03/28/2014      Patient did not show for Physical Therapy appointment on 03/28/2014. This was pt's final scheduled appointment and 3rd consecutive no show. Unable to assess final goals status. As per department policy, pt is discharged from physical therapy 2nd to consecutive no shows.     [name], PT, DPT  [place] LIC #[id number]  [email or web address] .[phone]

## 2014-04-16 ENCOUNTER — Ambulatory Visit
Admission: RE | Admit: 2014-04-16 | Discharge: 2014-04-16 | Disposition: A | Discharge status: Home / Self Care | Payer: Medicare Other | Source: Ambulatory Visit | Attending: Internal Medicine | Admitting: Internal Medicine

## 2014-04-16 DIAGNOSIS — S139XXA Sprain of joints and ligaments of unspecified parts of neck, initial encounter: Secondary | ICD-10-CM | POA: Insufficient documentation

## 2014-04-16 DIAGNOSIS — X58XXXA Exposure to other specified factors, initial encounter: Secondary | ICD-10-CM | POA: Insufficient documentation
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
## 2014-04-17 ENCOUNTER — Ambulatory Visit
Admission: RE | Admit: 2014-04-17 | Discharge: 2014-04-17 | Disposition: A | Discharge status: Home / Self Care | Payer: Medicare Other | Source: Ambulatory Visit

## 2014-04-17 NOTE — PT Plan of Care Note (Signed)
[facility]  4Physical Therapy Treatment Note      Patient:   [name]                                                        MRN#: [id number]  Date: 04/17/2014  # of Visits: 2    Time of treatment:   Start Time:  1005                     Stop Time: 1043    Subjective: Pain Rating: 7/10 Pt reports taken prescribed pain medication this morning. Pt reported neck pain post 04/16/14 Eval.       Objective/Measurements:     04/16/14 04/17/14    UBE Held 6.5 mins fwd w/ MHP 4 layers of towels (MHP for total of 15 mins) pt reported comfort    Upper cervical flexion "chin tucks' 15 x 5",  tc for proper motion and technique. ---    Deep cervical stabilization- rotation with chin tuck- "3 finger between chin and chest"  X 10 B, tc for proper motion and technique. 10x     Deep cervical isometrics- rotation with manual resistance   2 x 5 B for 3" hold  4x 5" hold pt reported posterior neck pain during Therex     levator stretch  30" x2 part of manual                    Functional Activities:      Manual Therapy: DTM to B/UT, noted R>L/UT tension required increase time to relax muscle, 1 muscle turgor noted R/UT. STM to deep cervical muscles.       Patient Education: Rev'd pt's scheduled therapy visits with pt. Setup HEP next session when improvement technique noted.     Current HEP Dosage                           Other:      Assessment: Have reviewed PT Eval for POC. Pt reported mild pain during chin tuck interventions, although pt reported decrease of pain level to 6/10 at end of therapy session. Pt req'd 75% cues during therex portion of tx for technique and to complete intervention in pain free ROM. Pt had decrease tolerance of therex interventions this session and req'd more rests and manual this session due to pain level. Pt would benefit from Continued PT services to decrease pain level to improve ADLs.         Progress towards goals:    Short Term Goals:  (in  3    weeks)  1. Pt demo Independent with HEP  and symptom management  2. Pt will decreased pain at worst to 5/10.      Long Term Goals:   (in   6   weeks)  1. Pt will improve NDI to <20% to decreased disability of neck. (gcode)- (04/16/14- 58%)  2. Pt will improve cervical extension AROM to 58 to improve safe driving.  3. Pt will improve cervical rotation AROM to 55 b/l to improve safe driving.  4. Pt will lift 30# from floor with no onset of pain.    G-codes  Current: CK NDI: 58%  Goal: CI  Outcome measure: NDI  Plan:  Adjustments to POC:    Charges Minutes   Ther ex 15   Ther act    Performance Test    Gait training    Neuro Re-Ed    Manual 23   [place]    [place]      Daine Gip,    [place] # [id number]    [facility]  Physical Medicine and Rehabilitation Department  [email or web address]  [phone] Main office

## 2014-04-23 ENCOUNTER — Ambulatory Visit
Admission: RE | Admit: 2014-04-23 | Discharge: 2014-04-23 | Disposition: A | Discharge status: Home / Self Care | Payer: Medicare Other | Source: Ambulatory Visit

## 2014-04-23 NOTE — PT Plan of Care Note (Signed)
[facility]  4Physical Therapy Treatment Note      Patient:   [name]                                                        MRN#: [id number]  Date: 04/23/2014  # of Visits: 3    Time of treatment:   Start Time:  1133                     Stop Time: 1215    Subjective: Pain Rating: 6/10 Pt reports that the pain is coming down.       Objective/Measurements:     04/16/14 04/17/14 04/23/14   UBE Held 6.5 mins fwd w/ MHP 4 layers of towels (MHP for total of 15 mins) pt reported comfort 7 min Forward and backwards with MHP and 4 towel layers   Upper cervical flexion "chin tucks' 15 x 5",  tc for proper motion and technique. --- 10 x5" with light submax retraction post tuck/nod yes, cues to avoid hyperextension when relaxing   Deep cervical stabilization- rotation with chin tuck- "3 finger between chin and chest"  X 10 B, tc for proper motion and technique. 10x  10x B, 5" hold   Deep cervical isometrics- rotation with manual resistance   2 x 5 B for 3" hold  4x 5" hold pt reported posterior neck pain during Therex          levator stretch  30" x2 part of manual 10" x 2 reps B seated   UT stretch   10" x 2 reps B seated             Functional Activities: n/a    Manual Therapy: Pt Supine: DTM to B/UT, noted L UT> R tension required increase time to relax muscle w/ trigger point in L UT. STM to deep cervical muscles. Suboccipital release x 3 reps.     Patient Education: Rev'd pt's scheduled therapy visits with pt. Provided HEP handout and reviewed with patient. Educated pt on being gentle with stretches and performing submax isometrics for improved pain control and dec muscle spasms.    Current HEP Dosage   Levator stretch 2 reps B BID 10-30" as tolerated   UT stretch 2 reps B BID 10-30" as tolerated   Chin tucks/retraction 10 x 5-10" every other day   Chin tuck with rotation 10 B x 5-10" every other day           Other: PTA has reviewed chart and PT Eval for POC prior to treatment.        Assessment: Pt  continues to req 75% cues during therex portion of tx for technique and to complete intervention in pain free ROM. Pt able to tolerate more TE this session. Pt reported mild increases in pain with TE; but reported that interventions were tolerable with modifications for submax contractions and cues to be gentle with stretches. Pt would benefit from Continued PT services to decrease pain level to improve ADLs.         Progress towards goals:    Short Term Goals:  (in  3    weeks)  1. Pt demo Independent with HEP and symptom management  2. Pt will decreased pain at worst to 5/10.  Long Term Goals:   (in   6   weeks)  1. Pt will improve NDI to <20% to decreased disability of neck. (gcode)- (04/16/14- 58%)  2. Pt will improve cervical extension AROM to 58 to improve safe driving.  3. Pt will improve cervical rotation AROM to 55 b/l to improve safe driving.  4. Pt will lift 30# from floor with no onset of pain.    G-codes  Current: CK NDI: 58%  Goal: CI  Outcome measure: NDI      Plan:  Adjustments to POC:    Charges Minutes   Ther ex 27   Ther act    Performance Test    Gait training    Neuro Re-Ed    Manual 15   [place]    [place]      Lear Ng, [place]  [place] Lic #[id number]  [facility] Out[facility]  Phone [phone]

## 2014-04-25 ENCOUNTER — Ambulatory Visit
Admission: RE | Admit: 2014-04-25 | Discharge: 2014-04-25 | Disposition: A | Discharge status: Home / Self Care | Payer: Medicare Other | Source: Ambulatory Visit

## 2014-04-25 NOTE — PT Plan of Care Note (Signed)
[facility]  4Physical Therapy Treatment Note      Patient:   [name]                                                        MRN#: [id number]  Date: 04/25/2014  # of Visits: 4    Time of treatment:   Start Time:  1047                    Stop Time: 1135    Subjective: Pain Rating: 7/10  currently and at worst, first thing in the morning. Pt reports TE less painful today. Pt states massage helps a lot with pain. Pt reports doing HEP at home and has no questions.        Objective/Measurements:     04/16/14 04/17/14 04/23/14 6/25   UBE Held 6.5 mins fwd w/ MHP 4 layers of towels (MHP for total of 15 mins) pt reported comfort 7 min Forward and backwards with MHP and 4 towel layers UBE occupied, applied MHP to neck in supine for 5' while doing subjective.   Upper cervical flexion "chin tucks' 15 x 5",  tc for proper motion and technique. --- 10 x5" with light submax retraction post tuck/nod yes, cues to avoid hyperextension when relaxing    Supine 10 x5" with light submax retraction post tuck/nod yes,   Deep cervical stabilization- rotation with chin tuck- "3 finger between chin and chest"  X 10 B, tc for proper motion and technique. 10x  10x B, 5" hold Supine 10x B, 5" hold,cues for nod yes   Deep cervical isometrics- rotation with manual resistance   2 x 5 B for 3" hold  4x 5" hold pt reported posterior neck pain during Therex        Supine 5 x 5" hold, sub max light contraction   levator stretch  30" x2 part of manual 10" x 2 reps B seated 10" x 2 B seated   UT stretch   10" x 2 reps B seated Part of manual   Gentle AROM    1. Cervical Rotation seated x 3 reps B at end of session    2. Gentle posterior shoulder rolls x 6, seated at end of session        Functional Activities: See education for sleeping positions.    Manual Therapy: Pt Supine: DTM to B/UT, noted tension required increase time to relax muscle w/ trigger point in L UT. STM to deep cervical muscles;increased tension noted in suboccipitals  requiring increased time to release. Suboccipital release x  4 reps. Gentle UT stretch BL 2 x 15".    Patient Education: Rev'd pt's scheduled therapy visits with pt. Provided HEP handout and reviewed with patient. Educated pt on pillows for sleeping on back and side and proper neck position (had pt practice during session) to reduce morning pain from improper alignment and stress.     Current HEP Dosage   Levator stretch 2 reps B BID 10-30" as tolerated   UT stretch 2 reps B BID 10-30" as tolerated   Chin tucks/retraction 10 x 5-10" every other day   Chin tuck with rotation 10 B x 5-10" every other day           Other: PTA has reviewed chart  and PT Eval for POC prior to treatment.        Assessment: Pt req dec cuing for familiar TE; about 40% for technique and to complete intervention in pain free ROM; indicating improved carryover and tolerance. Pt continues to be able to tolerate more TE this session. Pt continues to require cuing with contractions and stretches to be gentle. Pt continues to visibly demonstrate decrease cervical AROM in rotation and lateral flexion. Pt would benefit from Continued PT services to decrease pain level to improve ADLs.         Progress towards goals:    Short Term Goals:  (in  3    weeks)  1. Pt demo Independent with HEP and symptom management -Progressing 04/24/14  2. Pt will decreased pain at worst to 5/10.      Long Term Goals:   (in   6   weeks)  1. Pt will improve NDI to <20% to decreased disability of neck. (gcode)- (04/16/14- 58%)  2. Pt will improve cervical extension AROM to 58 to improve safe driving.  3. Pt will improve cervical rotation AROM to 55 b/l to improve safe driving.  4. Pt will lift 30# from floor with no onset of pain.    G-codes  Current: CK NDI: 58%  Goal: CI  Outcome measure: NDI      Plan:  Adjustments to POC:    Charges Minutes   Ther ex 22   Ther act    Performance Test    Gait training    Neuro Re-Ed    Manual 26   [place]    [place]      Lear Ng,  [place]  [place] Lic #[id number]  [facility]  Phone [phone]

## 2014-04-29 ENCOUNTER — Ambulatory Visit
Admission: RE | Admit: 2014-04-29 | Discharge: 2014-04-29 | Disposition: A | Discharge status: Home / Self Care | Payer: Medicare Other | Source: Ambulatory Visit

## 2014-04-29 NOTE — PT Plan of Care Note (Signed)
[facility]  4Physical Therapy Treatment Note      Patient:   [name]                                                        MRN#: [id number]  Date: 04/29/2014  # of Visits: 5    Time of treatment:   Start Time:  1402                   Stop Time: 1447    Subjective: Pain Rating: 7/10 currently; "I take pain pills at night to manage my pain."         Objective/Measurements:     04/16/14 04/17/14 04/23/14 6/25 04/29/14   UBE Held 6.5 mins fwd w/ MHP 4 layers of towels (MHP for total of 15 mins) pt reported comfort 7 min Forward and backwards with MHP and 4 towel layers UBE occupied, applied MHP to neck in supine for 5' while doing subjective. UBE 3/3 mins fwd and bwd w/ MHP on cervical neck with 5 layers of towel for 10 minutes   Upper cervical flexion "chin tucks' 15 x 5",  tc for proper motion and technique. --- 10 x5" with light submax retraction post tuck/nod yes, cues to avoid hyperextension when relaxing    Supine 10 x5" with light submax retraction post tuck/nod yes, Supine 10x 5" towel roll under neck 25% cues for technique   Deep cervical stabilization- rotation with chin tuck- "3 finger between chin and chest"  X 10 B, tc for proper motion and technique. 10x  10x B, 5" hold Supine 10x B, 5" hold,cues for nod yes 5x 2 50% cues for technique   Deep cervical isometrics- rotation with manual resistance   2 x 5 B for 3" hold  4x 5" hold pt reported posterior neck pain during Therex        Supine 5 x 5" hold, sub max light contraction Supine 5 x 5" hold, sub max light contraction   levator stretch  30" x2 part of manual 10" x 2 reps B seated 10" x 2 B seated    UT stretch   10" x 2 reps B seated Part of manual    Gentle AROM    1. Cervical Rotation seated x 3 reps B at end of session    2. Gentle posterior shoulder rolls x 6, seated at end of session     Passive spine and pect stretch     Supine in bed 2 towels rolled longwise lay on towel for 2 mins   Prone scap retraction      UE resting on bed  AAROM by LPTA 10x2    Prone press up      10x AAROM   100% cues to decrease neck extension    Prone shoulder extension     10x 2 BUE        Functional Activities:     Manual Therapy: Pt Supine; STM to deep cervical and upper thoracic muscles; MROM L/rotation w/ levator scap w/ manual pressure stretch.     Patient Education: Rev'd pt's scheduled therapy visits with pt. Pt educated to continue w/ HEP.  Educated pt on new interventions and the purpose. Educated pt on pillows for sleeping on back and side and proper neck position (  had pt practice during session) to reduce morning pain from improper alignment and stress.     Current HEP Dosage   Levator stretch 2 reps B BID 10-30" as tolerated   UT stretch 2 reps B BID 10-30" as tolerated   Chin tucks/retraction 10 x 5-10" every other day   Chin tuck with rotation 10 B x 5-10" every other day           Other: PTA has reviewed chart and PT Eval for POC prior to treatment.        Assessment: Pt reports improvement w/ sleeping position post education during last session, reduced pain. Pt reports decrease cervical neck pain at end of session 5/10. Pt req'd minx1 w/ prone interventions for proper technique to decrease cervical extension and maintain neurtral neck position. While completing UBE noted mild thoracic kyphotic posture, reasoning to focus on prone interventions. Pt continues to require 25% cues for supine therex. Pt continues to visibly demonstrate decrease cervical AROM in rotation and lateral flexion. Pt would benefit from Continued PT services to decrease pain level to improve ADLs.         Progress towards goals:    Short Term Goals:  (in  3    weeks)  1. Pt demo Independent with HEP and symptom management -Progressing 04/24/14  2. Pt will decreased pain at worst to 5/10.      Long Term Goals:   (in   6   weeks)  1. Pt will improve NDI to <20% to decreased disability of neck. (gcode)- (04/16/14- 58%)  2. Pt will improve cervical extension AROM to 58 to improve  safe driving.  3. Pt will improve cervical rotation AROM to 55 b/l to improve safe driving.  4. Pt will lift 30# from floor with no onset of pain.    G-codes  Current: CK NDI: 58%  Goal: CI  Outcome measure: NDI      Plan:  Adjustments to POC:    Charges Minutes   Ther ex 35   Ther act    Performance Test    Gait training    Neuro Re-Ed    Manual 10   [place]    [place]      Daine Gip, [place]   [place] # [id number]    [facility]  Physical Medicine and Rehabilitation Department  [email or web address]  [phone] Main office

## 2014-05-01 ENCOUNTER — Ambulatory Visit
Admission: RE | Admit: 2014-05-01 | Discharge: 2014-05-01 | Disposition: A | Discharge status: Home / Self Care | Payer: Medicare Other | Source: Ambulatory Visit | Attending: Internal Medicine | Admitting: Internal Medicine

## 2014-05-01 DIAGNOSIS — M542 Cervicalgia: Secondary | ICD-10-CM | POA: Insufficient documentation

## 2014-05-01 NOTE — PT Plan of Care Note (Signed)
[facility]  4Physical Therapy Treatment Note      Patient:   [name]                                                        MRN#: [id number]  Date: 05/01/2014  # of Visits: 6    Time of treatment:   Start Time:     1140             Stop Time:   1219    Subjective: "I have a hard time looking to the L especially when driving."        Objective/Measurements:       04/16/14 04/17/14 04/23/14 6/25 04/29/14 05/01/14   UBE  Held  6.5 mins fwd w/ MHP 4 layers of towels (MHP for total of 15 mins) pt reported comfort  7 min Forward and backwards with MHP and 4 towel layers  UBE occupied, applied MHP to neck in supine for 5' while doing subjective.  UBE 3/3 mins fwd and bwd w/ MHP on cervical neck with 5 layers of towel for 10 minutes  UBE 3/3 mins fwd and bwd w/ MHP on cervical neck with 5 layers of towel for 10 minutes to improve muscle pliability   Upper cervical flexion "chin tucks' 15 x 5",  tc for proper motion and technique. --- 10 x5" with light submax retraction post tuck/nod yes, cues to avoid hyperextension when relaxing    Supine 10 x5" with light submax retraction post tuck/nod yes, Supine 10x 5" towel roll under neck 25% cues for technique Supine 10x 5" towel roll under neck sub max contraction, cues for technique   Deep cervical stabilization- rotation with chin tuck- "3 finger between chin and chest"  X 10 B, tc for proper motion and technique. 10x  10x B, 5" hold Supine 10x B, 5" hold,cues for nod yes 5x 2 50% cues for technique Supine 10x B, 5" hold,cues for nod yes by moving the chin   Deep cervical isometrics- rotation with manual resistance   2 x 5 B for 3" hold  4x 5" hold pt reported posterior neck pain during Therex        Supine 5 x 5" hold, sub max light contraction Supine 5 x 5" hold, sub max light contraction With towel roll under neck in vertical position 1x10   levator stretch  30" x2 part of manual 10" x 2 reps B seated 10" x 2 B seated  20 sec x 2, B seated   UT stretch   10" x 2  reps B seated Part of manual  20 sec x 2, B seated with hands behind back   Gentle AROM    1. Cervical Rotation seated x 3 reps B at end of session    2. Gentle posterior shoulder rolls x 6, seated at end of session    Gentle posterior shoulder rolls x 6, seated   Passive spine and pect stretch     Supine in bed 2 towels rolled longwise lay on towel for 2 mins --   Prone scap retraction      UE resting on bed AAROM by LPTA 10x2  Rows with YTB 1x15 in standing with v cues for technique and posture   Prone press up  10x AAROM   100% cues to decrease neck extension  --   Prone shoulder extension     10x 2 BUE  --     Added rows to HEP to improve posture.  PT to provide v cues to technique and form.    Functional Activities: n/a    Manual Therapy: Pt Supine; STM to deep cervical and suboccipitals x 5 min.  Gentle stretch of B levator scap and UT x 30 sec x 2 B'ly.  Gentle manual traction of c spine with 90 sec holds x 3 to decrease pain.       Patient Education: Pt educated on his next scheduled therapy visits. Pt educated to continue w/ HEP and pt provided with handout.  Pt also issued a YTB and given instructions on how to set up rows at home. Pt verbalized understanding.       Current HEP Dosage   Levator stretch 2 reps B BID 10-30" as tolerated   UT stretch 2 reps B BID 10-30" as tolerated   Chin tucks/retraction 10 x 5-10" every other day   Chin tuck with rotation 10 B x 5-10" every other day   rows YTB 2x10 every other day       Other: PT has reviewed chart and PT Eval for POC prior to treatment.      Assessment: Pt reports improvement w/ sleeping position post education during last session, reduced pain. Pt reports decrease cervical neck pain at end of session 5/10. Pt req'd minx1 w/ prone interventions for proper technique to decrease cervical extension and maintain neurtral neck position. While completing UBE noted mild thoracic kyphotic posture, reasoning to focus on prone interventions. Pt continues to  require 25% cues for supine therex. Pt continues to visibly demonstrate decrease cervical AROM in rotation and lateral flexion. Pt would benefit from Continued PT services to decrease pain level to improve ADLs.         Progress towards goals:    Short Term Goals:  (in  3    weeks)  1. Pt demo Independent with HEP and symptom management -Progressing 04/24/14  2. Pt will decreased pain at worst to 5/10.      Long Term Goals:   (in   6   weeks)  1. Pt will improve NDI to <20% to decreased disability of neck. (gcode)- (04/16/14- 58%)  2. Pt will improve cervical extension AROM to 58 to improve safe driving.  3. Pt will improve cervical rotation AROM to 55 b/l to improve safe driving.  4. Pt will lift 30# from floor with no onset of pain.    G-codes  Current: CK NDI: 58%  Goal: CI  Outcome measure: NDI      Plan: Cont PT POC  Adjustments to POC:  Measure cervical rotation.     Charges Minutes   Ther ex 25   Ther act    Performance Test    Gait training    Neuro Re-Ed    Manual [address]   [place]    [place]      [place], [place]  [place] License:  [id number]  [email or web address]    [facility]    [address]   [place], [place]  [address]  [email or web address]  05/01/2014

## 2014-05-06 ENCOUNTER — Ambulatory Visit
Admission: RE | Admit: 2014-05-06 | Discharge: 2014-05-06 | Disposition: A | Discharge status: Home / Self Care | Payer: Medicare Other | Source: Ambulatory Visit

## 2014-05-06 NOTE — PT Plan of Care Note (Signed)
[facility]  4Physical Therapy Treatment Note      Patient:   [name]                                                        MRN#: [id number]  Date: 05/06/2014  # of Visits: 7    Time of treatment:   Start Time:     1038             Stop Time:   1125    Subjective: "Good." Pt verifies that he feels it's harder to turn head left than right. 7/10 pain with cervical rotation.        Objective/Measurements:     04/16/14 04/17/14 04/23/14 6/25 04/29/14 05/01/14 05/06/14   UBE  Held  6.5 mins fwd w/ MHP 4 layers of towels (MHP for total of 15 mins) pt reported comfort  7 min Forward and backwards with MHP and 4 towel layers  UBE occupied, applied MHP to neck in supine for 5' while doing subjective.  UBE 3/3 mins fwd and bwd w/ MHP on cervical neck with 5 layers of towel for 10 minutes  UBE 3/3 mins fwd and bwd w/ MHP on cervical neck with 5 layers of towel for 10 minutes to improve muscle pliability UBE 3/3 mins fwd and bwd w/ MHP on cervical neck with 4 layers of towel for 10 minutes to improve muscle pliability   Upper cervical flexion "chin tucks' 15 x 5",  tc for proper motion and technique. --- 10 x5" with light submax retraction post tuck/nod yes, cues to avoid hyperextension when relaxing    Supine 10 x5" with light submax retraction post tuck/nod yes, Supine 10x 5" towel roll under neck 25% cues for technique Supine 10x 5" towel roll under neck sub max contraction, cues for technique Supine 10x 5" towel roll under neck sub max contraction, cues for technique   Deep cervical stabilization- rotation with chin tuck- "3 finger between chin and chest"  X 10 B, tc for proper motion and technique. 10x  10x B, 5" hold Supine 10x B, 5" hold,cues for nod yes 5x 2 50% cues for technique Supine 10x B, 5" hold,cues for nod yes by moving the chin Supine 10x B, 5" hold,cues for nod yes by moving the chin   Deep cervical isometrics- rotation with manual resistance   2 x 5 B for 3" hold  4x 5" hold pt reported posterior  neck pain during Therex        Supine 5 x 5" hold, sub max light contraction Supine 5 x 5" hold, sub max light contraction With towel roll under neck in vertical position 1x10 With towel roll under neck in vertical position 5" sub max hold x10   levator stretch  30" x2 part of manual 10" x 2 reps B seated 10" x 2 B seated  20 sec x 2, B seated HEP   UT stretch   10" x 2 reps B seated Part of manual  20 sec x 2, B seated with hands behind back HEP   Gentle AROM    1. Cervical Rotation seated x 3 reps B at end of session    2. Gentle posterior shoulder rolls x 6, seated at end of session      Gentle posterior  shoulder rolls x 6, seated 1. Cervical rotation x 5 seated B pre goni     2. Cont gentle posterior shoulder rolls next visit   Passive spine and pect stretch     Supine in bed 2 towels rolled longwise lay on towel for 2 mins --    Prone scap retraction      UE resting on bed AAROM by LPTA 10x2  Rows with YTB 1x15 in standing with v cues for technique and posture Next visit   Prone press up      10x AAROM   100% cues to decrease neck extension  --    Prone shoulder extension     10x 2 BUE  --      ROM  Right  AROM 6/16 Left  AROM 6/16 Right AROM 05/06/14 Left AROM 05/06/14   Flexion  30*  na  -- na   Extension  15*  na  -- na   Side Bending  8*  10*    -- --   Rotation  25*  25*  9 deg 22 deg     05/06/14 Goni pre-visualization technique, post manual and TE.    Functional Activities: n/a    Manual Therapy: Pt supine; STM to deep cervical and suboccipitals, UT, and levator x 6 min.  Gentle manual traction and suboccipital release of c spine with 30 sec holds x 3 to decrease pain. Biofreeze applied with patient permission to UT and posterior cervical neck muscles up to suboccipitals (pt reviewed ingredients).    Patient Education: Pt educated on his next scheduled therapy visits. Pt educated on visualization technique to improve cervical rotational ROM and decrease in rotational ROM from evaluation; provided handout.  Emphasized pain free ROM.     Current HEP Dosage 05/06/14   Levator stretch 2 reps B BID 10-30" as tolerated Cont   UT stretch 2 reps B BID 10-30" as tolerated Cont   Chin tucks/retraction 10 x 5-10" every other day Cont   Chin tuck with rotation 10 B x 5-10" every other day Cont   rows YTB 2x10 every other day Cont   Cervical rotation ROM w/ visualization  3 reps pure visualization, f/b 10 reps visualization with motion on both sides, BID       Other: PT has reviewed chart and PT Eval for POC prior to treatment.      Assessment: Pt requires less cuing for form during TE. Pt continues to present with moderate to maximal muscular tension in UT, levator and suboccipitals. Pt guards against pain which may be causing patient to excessively limit cervical motion causing increasing muscular tightness and decreased motion. Pt with visibly improved cervical rotational ROM post visualization technique. Pt continues to visibly demonstrate decrease cervical AROM in rotation and lateral flexion. Pt would benefit from Continued PT services to decrease pain level to improve ADLs.         Progress towards goals:    Short Term Goals:  (in  3    weeks)  1. Pt demo Independent with HEP and symptom management -Progressing 04/24/14  2. Pt will decreased pain at worst to 5/10.      Long Term Goals:   (in   6   weeks)  1. Pt will improve NDI to <20% to decreased disability of neck. (gcode)- (04/16/14- 58%)  2. Pt will improve cervical extension AROM to 58 to improve safe driving.  3. Pt will improve cervical rotation AROM to 55 b/l to  improve safe driving. -Not progressing 05/06/14  4. Pt will lift 30# from floor with no onset of pain.    G-codes  Current: CK NDI: 58%  Goal: CI  Outcome measure: NDI      Plan: Cont PT POC; continue visualization techniques to improve ROM.  Adjustments to POC:      Charges Minutes   Ther ex 37   Ther act    Performance Test    Gait training    Neuro Re-Ed    Manual 10   [place]    [place]      Stefano Gaul  [place] Lic #[id number]  [facility]  Phone [phone]     05/06/2014

## 2014-05-08 ENCOUNTER — Ambulatory Visit
Admission: RE | Admit: 2014-05-08 | Discharge: 2014-05-08 | Disposition: A | Discharge status: Home / Self Care | Payer: Medicare Other | Source: Ambulatory Visit

## 2014-05-08 NOTE — PT Plan of Care Note (Signed)
[facility]  4Physical Therapy Treatment Note      Patient:   [name]                                                        MRN#: [id number]  Date: 05/08/2014  # of Visits: 8    Time of treatment:   Start Time:     1000             Stop Time:   1048    Subjective: "I had a headache this morning when I woke up." pt reports decrease of overall pain since beginning therapy. 6/10 L/side of neck pain        Objective/Measurements:     04/16/14 04/17/14 04/23/14 6/25 04/29/14 05/01/14 05/06/14 05/08/14   UBE  Held  6.5 mins fwd w/ MHP 4 layers of towels (MHP for total of 15 mins) pt reported comfort  7 min Forward and backwards with MHP and 4 towel layers  UBE occupied, applied MHP to neck in supine for 5' while doing subjective.  UBE 3/3 mins fwd and bwd w/ MHP on cervical neck with 5 layers of towel for 10 minutes  UBE 3/3 mins fwd and bwd w/ MHP on cervical neck with 5 layers of towel for 10 minutes to improve muscle pliability UBE 3/3 mins fwd and bwd w/ MHP on cervical neck with 4 layers of towel for 10 minutes to improve muscle pliability UBE 4/4 mins fwd and bwd   Upper cervical flexion "chin tucks' 15 x 5",  tc for proper motion and technique. --- 10 x5" with light submax retraction post tuck/nod yes, cues to avoid hyperextension when relaxing    Supine 10 x5" with light submax retraction post tuck/nod yes, Supine 10x 5" towel roll under neck 25% cues for technique Supine 10x 5" towel roll under neck sub max contraction, cues for technique Supine 10x 5" towel roll under neck sub max contraction, cues for technique ---   Deep cervical stabilization- rotation with chin tuck- "3 finger between chin and chest"  X 10 B, tc for proper motion and technique. 10x  10x B, 5" hold Supine 10x B, 5" hold,cues for nod yes 5x 2 50% cues for technique Supine 10x B, 5" hold,cues for nod yes by moving the chin Supine 10x B, 5" hold,cues for nod yes by moving the chin --   Deep cervical isometrics- rotation with manual  resistance   2 x 5 B for 3" hold  4x 5" hold pt reported posterior neck pain during Therex        Supine 5 x 5" hold, sub max light contraction Supine 5 x 5" hold, sub max light contraction With towel roll under neck in vertical position 1x10 With towel roll under neck in vertical position 5" sub max hold x10    levator stretch  30" x2 part of manual 10" x 2 reps B seated 10" x 2 B seated  20 sec x 2, B seated HEP    UT stretch   10" x 2 reps B seated Part of manual  20 sec x 2, B seated with hands behind back HEP    Gentle AROM    1. Cervical Rotation seated x 3 reps B at end of session    2. Gentle  posterior shoulder rolls x 6, seated at end of session      Gentle posterior shoulder rolls x 6, seated 1. Cervical rotation x 5 seated B pre goni     2. Cont gentle posterior shoulder rolls next visit    Passive spine and pect stretch     Supine in bed 2 towels rolled longwise lay on towel for 2 mins --  Supine in bed 2 half foam roll longwise lay on towel for 2 mins   Prone scap retraction      UE resting on bed AAROM by LPTA 10x2  Rows with YTB 1x15 in standing with v cues for technique and posture Next visit Prone 10x BUE    Prone press up back extension     10x AAROM   100% cues to decrease neck extension  --  10x2 w/ 25% cues   Prone shoulder extension     10x 2 BUE  --  10x2 BUE 2#   Lats pull downs        Plate 3 16X   Shoulder 90/90 retraction        Seated 10x 0# 25% tactile and visual cues for thoracic and lumbar extension   Lawn moyer pulls w/ cervical rotation        10x Bly p! On L/side rotation compensate w/ trunk rotation     ROM  Right  AROM 6/16 Left  AROM 6/16 Right AROM 05/06/14 Left AROM 05/06/14   Flexion  30*  na  -- na   Extension  15*  na  -- na   Side Bending  8*  10*    -- --   Rotation  25*  25*  9 deg 22 deg       Functional Activities: n/a    Manual Therapy: Supine position manual cervical rotation Bly to available end range that is limited by pain. Manual gentle cervical distraction and chin  tuck motion. STM to deep cervical spinae muscles.     Patient Education: Pt educated on his next scheduled therapy visits. Pt educated on visualization technique to improve cervical rotational ROM and decrease in rotational ROM from evaluation; provided handout. Emphasized pain free ROM.     Current HEP Dosage 05/06/14   Levator stretch 2 reps B BID 10-30" as tolerated Cont   UT stretch 2 reps B BID 10-30" as tolerated Cont   Chin tucks/retraction 10 x 5-10" every other day Cont   Chin tuck with rotation 10 B x 5-10" every other day Cont   rows YTB 2x10 every other day Cont   Cervical rotation ROM w/ visualization  3 reps pure visualization, f/b 10 reps visualization with motion on both sides, BID       Other: PT has reviewed chart and PT Eval for POC prior to treatment.      Assessment: Pt reports continue barrier w/ active cervical rotation during daily ADLs. With seated therex noted decrease postural control and increase thoracic flexed posture educated pt on posture and had pt complete lat pull downs and back extension intervention to improve posture. Pt reported pain level remained 6/10 at end of session, but "it will go down later today after these exercises". Pt tolerated progression well, only limited by pain during cervical rotation intervention. Rec'd Primary PT next session to reassess pt's progress with therapy and make modifications to tx plan as applicable. Pt would benefit from Continued PT services to decrease pain level to improve ADLs.  Progress towards goals:    Short Term Goals:  (in  3    weeks)  1. Pt demo Independent with HEP and symptom management -Progressing 04/24/14  2. Pt will decreased pain at worst to 5/10.      Long Term Goals:   (in   6   weeks)  1. Pt will improve NDI to <20% to decreased disability of neck. (gcode)- (04/16/14- 58%)  2. Pt will improve cervical extension AROM to 58 to improve safe driving.  3. Pt will improve cervical rotation AROM to 55 b/l to improve safe  driving. -Not progressing 05/06/14  4. Pt will lift 30# from floor with no onset of pain.    G-codes  Current: CK NDI: 58%  Goal: CI  Outcome measure: NDI      Plan: Cont PT POC; continue visualization techniques to improve ROM.  Adjustments to POC:      Charges Minutes   Ther ex 45   Ther act    Performance Test    Gait training    Neuro Re-Ed    Manual 3   [place]    [place]      Daine Gip, [place]   [place] # [id number]    [facility]  Physical Medicine and Rehabilitation Department  [email or web address]  [phone] Main office       05/08/2014

## 2014-05-14 ENCOUNTER — Ambulatory Visit
Admission: RE | Admit: 2014-05-14 | Discharge: 2014-05-14 | Disposition: A | Discharge status: Home / Self Care | Payer: Medicare Other | Source: Ambulatory Visit

## 2014-05-14 NOTE — Progress Notes (Signed)
NAMEMichael Walrath  MRN: [id number] DOB: Apr 24, 1932 AGE:80 y.o.    [facility]   Physical Medicine and Rehabilitation  [address]  [place], [place] [address]  [phone]    PHYSICAL THERAPY PROGRESS NOTE  Date:  05/14/2014    PATIENT:  [name]        MRN#: [id number]          DOB:  03/31/1932  DIAGNOSIS:      DATES OF TREATMENT:      04/16/14 to 05/14/14         #VISITS:  9  # Cancel/No show: 0    Treatment Provided: manual therapy, therapy, therapeutic activity, HEP.      Current Functional Status/Summary of Progress:  Pt with improved cervical AROM, indicating progress with therapy, but not yet attained goals. Pt with pain with end range cervical extension and left rotation AROM, but no longer has pain with right rotation. Pt with decreased pain at worst to 7/10, indicating progress with therapy. Pt with decreased NDI to 36%, indicating decreased disability of neck. Despite improvements, pt did not yet attain all goals and cont to have decreased function and participation due to neck symptoms, requiring continued physical therapy.       ROM  Right  AROM 6/16  Left  AROM 6/16  Right AROM 05/06/14  Left AROM 05/06/14    Extension  15*  na  --  na    Rotation  25*  25*  9 deg  22 deg      ROM  Right  AROM 05/14/14  Left  AROM 05/14/14    Extension  35*  na    Rotation  34  25*        Current Goals:  Short Term Goals: (in 3 weeks)  1. Pt demo Independent with HEP and symptom management -PROGRESSING  2. Pt will decreased pain at worst to 5/10. - PROGRESSING (04/16/14- 10/10, 05/14/14- 7/10)      Long Term Goals: (in 6 weeks)  1. Pt will improve NDI to <20% to decreased disability of neck. (gcode)- PROGRESSING (04/16/14- 58%, 05/14/14- 36%)  2. Pt will improve cervical extension AROM to 58 to improve safe driving.- PROGRESSING  3. Pt will improve cervical rotation AROM to 55 b/l to improve safe driving. -PROGRESSING  4. Pt will lift 30# from floor with no onset of pain.- PROGRESSING (04/16/14- unable, 05/14/14-  12# with no pain).    G-codes  Current: CJ  Goal: CI  Outcome measure: NDI        Plan:   Cont PT 2x/wk for 4 weeks.      [name], PT, DPT  [place] [facility] #[id number]  [name]  [phone]      05/14/2014

## 2014-05-14 NOTE — PT Plan of Care Note (Signed)
[facility]  Physical Therapy Treatment Note      Patient:   [name]                                                        MRN#: [id number]  Date: 05/14/2014  # of Visits: 9    Time of treatment:   Start Time:     1045             Stop Time:   1125    Subjective: "I still get headaches and neck pain. Did you see my MRI? Its very bad, but I don't want surgery".         Objective/Measurements:     04/23/14 6/25 04/29/14 05/01/14 05/06/14 05/08/14 05/14/14   UBE  7 min Forward and backwards with MHP and 4 towel layers  UBE occupied, applied MHP to neck in supine for 5' while doing subjective.  UBE 3/3 mins fwd and bwd w/ MHP on cervical neck with 5 layers of towel for 10 minutes  UBE 3/3 mins fwd and bwd w/ MHP on cervical neck with 5 layers of towel for 10 minutes to improve muscle pliability UBE 3/3 mins fwd and bwd w/ MHP on cervical neck with 4 layers of towel for 10 minutes to improve muscle pliability UBE 4/4 mins fwd and bwd UBE 3/3 mins fwd and bwd   Upper cervical flexion "chin tucks' 10 x5" with light submax retraction post tuck/nod yes, cues to avoid hyperextension when relaxing    Supine 10 x5" with light submax retraction post tuck/nod yes, Supine 10x 5" towel roll under neck 25% cues for technique Supine 10x 5" towel roll under neck sub max contraction, cues for technique Supine 10x 5" towel roll under neck sub max contraction, cues for technique --- Supine 10x B, 5" hold   Deep cervical stabilization- rotation with chin tuck- "3 finger between chin and chest"  10x B, 5" hold Supine 10x B, 5" hold,cues for nod yes 5x 2 50% cues for technique Supine 10x B, 5" hold,cues for nod yes by moving the chin Supine 10x B, 5" hold,cues for nod yes by moving the chin -- Supine 10x B, 5" hold   Deep cervical isometrics- rotation with manual resistance         Supine 5 x 5" hold, sub max light contraction Supine 5 x 5" hold, sub max light contraction With towel roll under neck in vertical position 1x10 With  towel roll under neck in vertical position 5" sub max hold x10     levator stretch 10" x 2 reps B seated 10" x 2 B seated  20 sec x 2, B seated HEP     UT stretch 10" x 2 reps B seated Part of manual  20 sec x 2, B seated with hands behind back HEP     Gentle AROM  1. Cervical Rotation seated x 3 reps B at end of session    2. Gentle posterior shoulder rolls x 6, seated at end of session      Gentle posterior shoulder rolls x 6, seated 1. Cervical rotation x 5 seated B pre goni     2. Cont gentle posterior shoulder rolls next visit     Passive spine and pect stretch   Supine in bed  2 towels rolled longwise lay on towel for 2 mins --  Supine in bed 2 half foam roll longwise lay on towel for 2 mins    Prone scap retraction    UE resting on bed AAROM by LPTA 10x2  Rows with YTB 1x15 in standing with v cues for technique and posture Next visit Prone 10x BUE     Prone press up back extension   10x AAROM   100% cues to decrease neck extension  --  10x2 w/ 25% cues    Prone shoulder extension   10x 2 BUE  --  10x2 BUE 2#    Lats pull downs      Plate 3 54U    Shoulder 90/90 retraction      Seated 10x 0# 25% tactile and visual cues for thoracic and lumbar extension    Lawn moyer pulls w/ cervical rotation      10x Bly p! On L/side rotation compensate w/ trunk rotation      ROM  Right  AROM 6/16 Left  AROM 6/16 Right AROM 05/06/14 Left AROM 05/06/14   Extension  15*  na  -- na   Rotation  25*  25*  9 deg 22 deg       ROM  Right  AROM 05/14/14 Left  AROM 05/14/14   Extension  35* na   Rotation  34 25*       Functional Activities: Pt lifted 12# box from floor with no pain in neck, demonstrate proper form. Pt lifted 22# box, but had pain. Weight lowered to 18#, but cont to have pain.    Manual Therapy: Supine position manual cervical rotation B/L to available end range that is limited by pain. Manual gentle cervical distraction and chin tuck motion. STM upglides to cervical paraspinals.    Patient Education: Pt educated on progress  with POC. Pt educated on proper lifting mechanics from floor maintaining neutral spine.        Current HEP Dosage 05/06/14   Levator stretch 2 reps B BID 10-30" as tolerated Cont   UT stretch 2 reps B BID 10-30" as tolerated Cont   Chin tucks/retraction 10 x 5-10" every other day Cont   Chin tuck with rotation 10 B x 5-10" every other day Cont   rows YTB 2x10 every other day Cont   Cervical rotation ROM w/ visualization  3 reps pure visualization, f/b 10 reps visualization with motion on both sides, BID       Other:   NDI: 36%      Assessment: Pt with improved cervical AROM, indicating progress with therapy, but not yet attained goals. Pt with pain with end range cervical extension and left rotation AROM, but no longer has pain with right rotation. Pt with decreased pain at worst to 7/10, indicating progress with therapy. Pt with decreased NDI to 36%, indicating decreased disability of neck. Despite improvements, pt did not yet attain all goals and cont to have decreased function and participation due to neck symptoms, requiring continued physical therapy.       Progress towards goals:    Short Term Goals:  (in  3    weeks)  1. Pt demo Independent with HEP and symptom management -PROGRESSING  2. Pt will decreased pain at worst to 5/10. - PROGRESSING (04/16/14- 10/10, 05/14/14- 7/10)      Long Term Goals:   (in   6   weeks)  1. Pt will improve NDI to <20% to  decreased disability of neck. (gcode)- PROGRESSING (04/16/14- 58%, 05/14/14- 36%)  2. Pt will improve cervical extension AROM to 58 to improve safe driving.- PROGRESSING  3. Pt will improve cervical rotation AROM to 55 b/l to improve safe driving. -PROGRESSING  4. Pt will lift 30# from floor with no onset of pain.- PROGRESSING (04/16/14- unable, 05/14/14- 12# with no pain)      Plan: Cont PT POC;  Adjustments to POC:      Charges Minutes   Ther ex 14   Ther act 8   Performance Test    Gait training    Neuro Re-Ed    Manual [address]   [place]    [place]      [name], PT, DPT  [place]  [facility] #[id number]  [name]  [phone]

## 2014-05-16 ENCOUNTER — Ambulatory Visit
Admission: RE | Admit: 2014-05-16 | Discharge: 2014-05-16 | Disposition: A | Discharge status: Home / Self Care | Payer: Medicare Other | Source: Ambulatory Visit

## 2014-05-16 NOTE — PT Plan of Care Note (Signed)
[facility]  Physical Therapy Treatment Note      Patient:   [name]                                                        MRN#: [id number]  Date: 05/16/2014  # of Visits: 10    Time of treatment:   Start Time:     1150           Stop Time:   1225    Subjective: "Very good. I am better but I still have pain." 5-6/10 L, 4/10 R. Pt reports 0/10 pain at end of session and liking feeling of biofreeze.        Objective/Measurements:     04/23/14 6/25 04/29/14 05/01/14 05/06/14 05/08/14 05/14/14 05/15/14   UBE  7 min Forward and backwards with MHP and 4 towel layers  UBE occupied, applied MHP to neck in supine for 5' while doing subjective.  UBE 3/3 mins fwd and bwd w/ MHP on cervical neck with 5 layers of towel for 10 minutes  UBE 3/3 mins fwd and bwd w/ MHP on cervical neck with 5 layers of towel for 10 minutes to improve muscle pliability UBE 3/3 mins fwd and bwd w/ MHP on cervical neck with 4 layers of towel for 10 minutes to improve muscle pliability UBE 4/4 mins fwd and bwd UBE 3/3 mins fwd and bwd UBE 3/3 mins fwd and bwd   Upper cervical flexion "chin tucks' 10 x5" with light submax retraction post tuck/nod yes, cues to avoid hyperextension when relaxing    Supine 10 x5" with light submax retraction post tuck/nod yes, Supine 10x 5" towel roll under neck 25% cues for technique Supine 10x 5" towel roll under neck sub max contraction, cues for technique Supine 10x 5" towel roll under neck sub max contraction, cues for technique --- Supine 10x B, 5" hold    Deep cervical stabilization- rotation with chin tuck- "3 finger between chin and chest"  10x B, 5" hold Supine 10x B, 5" hold,cues for nod yes 5x 2 50% cues for technique Supine 10x B, 5" hold,cues for nod yes by moving the chin Supine 10x B, 5" hold,cues for nod yes by moving the chin -- Supine 10x B, 5" hold    Deep cervical isometrics- rotation with manual resistance         Supine 5 x 5" hold, sub max light contraction Supine 5 x 5" hold, sub max  light contraction With towel roll under neck in vertical position 1x10 With towel roll under neck in vertical position 5" sub max hold x10      levator stretch 10" x 2 reps B seated 10" x 2 B seated  20 sec x 2, B seated HEP      UT stretch 10" x 2 reps B seated Part of manual  20 sec x 2, B seated with hands behind back HEP      Gentle AROM  1. Cervical Rotation seated x 3 reps B at end of session    2. Gentle posterior shoulder rolls x 6, seated at end of session      Gentle posterior shoulder rolls x 6, seated 1. Cervical rotation x 5 seated B pre goni     2. Cont gentle posterior shoulder rolls  next visit   1. Cervical rotation x 5 B seated, noted increasing range with each rep (post manual)   Passive spine and pect stretch   Supine in bed 2 towels rolled longwise lay on towel for 2 mins --  Supine in bed 2 half foam roll longwise lay on towel for 2 mins     Prone scap retraction    UE resting on bed AAROM by LPTA 10x2  Rows with YTB 1x15 in standing with v cues for technique and posture Next visit Prone 10x BUE      Prone press up back extension   10x AAROM   100% cues to decrease neck extension  --  10x2 w/ 25% cues     Prone shoulder extension   10x 2 BUE  --  10x2 BUE 2#     Lats pull downs      Plate 3 16X     Shoulder 90/90 retraction      Seated 10x 0# 25% tactile and visual cues for thoracic and lumbar extension     Lawn moyer pulls w/ cervical rotation      10x Bly p! On L/side rotation compensate w/ trunk rotation       ROM  Right  AROM 6/16 Left  AROM 6/16 Right AROM 05/06/14 Left AROM 05/06/14   Extension  15*  na  -- na   Rotation  25*  25*  9 deg 22 deg       ROM  Right  AROM 05/14/14 Left  AROM 05/14/14   Extension  35* na   Rotation  34 25*       Functional Activities: n/a    Manual Therapy: Supine: STM and DTM to cervical subocciptials, erector spinae, UT and levator with deep prep II cream. Gentle suboccipital release x 60 seconds then continued slight flexion with cervical rotation each direction.  Held in new range during L UT TP release and then slowly increased range x 3 BL. Cleaned skin with alcohol wipes and then applied biofreeze to B posterior neck and UT with pt permission and pt review of ingredients.     Patient Education: Pt educated on progress with POC. Pt educated on effects of biofreeze.      Current HEP Dosage 05/06/14   Levator stretch 2 reps B BID 10-30" as tolerated Cont   UT stretch 2 reps B BID 10-30" as tolerated Cont   Chin tucks/retraction 10 x 5-10" every other day Cont   Chin tuck with rotation 10 B x 5-10" every other day Cont   rows YTB 2x10 every other day Cont   Cervical rotation ROM w/ visualization  3 reps pure visualization, f/b 10 reps visualization with motion on both sides, BID       Other: PTA has reviewed chart and PT Eval for POC prior to treatment.        Assessment: Pt continues to present with persistent high neck pain greater on L than R. Pt with moderate tension at C6-T2 on L and muscle tissue responds moderately well to DTM and biofreeze application. Noted excessive posterior protrusion spine at C7 indicating poor alignment and consistent with muscular tension.  While pt reports no pain at end of session; cervical rotation ROM remains limited; however, visibly increases with reps and improves in symmetry. Despite improvements, pt did not yet attain all goals and cont to have decreased function and participation due to neck symptoms, requiring continued physical therapy.  Progress towards goals:    Short Term Goals:  (in  3    weeks)  1. Pt demo Independent with HEP and symptom management -PROGRESSING  2. Pt will decreased pain at worst to 5/10. - PROGRESSING (04/16/14- 10/10, 05/14/14- 7/10)      Long Term Goals:   (in   6   weeks)  1. Pt will improve NDI to <20% to decreased disability of neck. (gcode)- PROGRESSING (04/16/14- 58%, 05/14/14- 36%)  2. Pt will improve cervical extension AROM to 58 to improve safe driving.- PROGRESSING  3. Pt will improve cervical  rotation AROM to 55 b/l to improve safe driving. -PROGRESSING  4. Pt will lift 30# from floor with no onset of pain.- PROGRESSING (04/16/14- unable, 05/14/14- 12# with no pain)      Plan: Cont PT POC; consider kinesiotaping for alignment and muscle relaxation and focus on advancing cervical TE to standing and prone to maximize function of neck.  Adjustments to POC:      Charges Minutes   Ther ex 10   Ther act    Performance Test    Gait training    Neuro Re-Ed    Manual 25   [place]    [place]      Lear Ng, [place]  [place] Lic #[id number]  [facility]  Phone [phone]

## 2014-05-20 ENCOUNTER — Ambulatory Visit
Admission: RE | Admit: 2014-05-20 | Discharge: 2014-05-20 | Disposition: A | Discharge status: Home / Self Care | Payer: Medicare Other | Source: Ambulatory Visit

## 2014-05-20 NOTE — PT Plan of Care Note (Signed)
[facility]  Physical Therapy Treatment Note      Patient:   [name]                                                        MRN#: [id number]  Date: 05/20/2014  # of Visits: 11    Time of treatment:   Start Time:     1155           Stop Time:   1225    Subjective: "Very good. I still have pain." 5/10 L, 6-7/10 R. Pt reports liking biofreeze last session and ear ringing on R that started last week. Denies HAs.        Objective/Measurements:     04/23/14 6/25 04/29/14 05/01/14 05/06/14 05/08/14 05/14/14 05/15/14 05/20/14   UBE  7 min Forward and backwards with MHP and 4 towel layers  UBE occupied, applied MHP to neck in supine for 5' while doing subjective.  UBE 3/3 mins fwd and bwd w/ MHP on cervical neck with 5 layers of towel for 10 minutes  UBE 3/3 mins fwd and bwd w/ MHP on cervical neck with 5 layers of towel for 10 minutes to improve muscle pliability UBE 3/3 mins fwd and bwd w/ MHP on cervical neck with 4 layers of towel for 10 minutes to improve muscle pliability UBE 4/4 mins fwd and bwd UBE 3/3 mins fwd and bwd UBE 3/3 mins fwd and bwd UBE 3/3 mins fwd and bwd   Upper cervical flexion "chin tucks' 10 x5" with light submax retraction post tuck/nod yes, cues to avoid hyperextension when relaxing    Supine 10 x5" with light submax retraction post tuck/nod yes, Supine 10x 5" towel roll under neck 25% cues for technique Supine 10x 5" towel roll under neck sub max contraction, cues for technique Supine 10x 5" towel roll under neck sub max contraction, cues for technique --- Supine 10x B, 5" hold   Standing x 10 reps  with towel behind head, cues for cervical retraction and flexion    Deep cervical stabilization- rotation with chin tuck- "3 finger between chin and chest"  10x B, 5" hold Supine 10x B, 5" hold,cues for nod yes 5x 2 50% cues for technique Supine 10x B, 5" hold,cues for nod yes by moving the chin Supine 10x B, 5" hold,cues for nod yes by moving the chin -- Supine 10x B, 5" hold  Standing  x 10  reps  with towel behind head, cues for cervical retraction and flexion   Deep cervical isometrics- rotation with manual resistance         Supine 5 x 5" hold, sub max light contraction Supine 5 x 5" hold, sub max light contraction With towel roll under neck in vertical position 1x10 With towel roll under neck in vertical position 5" sub max hold x10       levator stretch 10" x 2 reps B seated 10" x 2 B seated  20 sec x 2, B seated HEP       UT stretch 10" x 2 reps B seated Part of manual  20 sec x 2, B seated with hands behind back HEP       Gentle AROM  1. Cervical Rotation seated x 3 reps B at end of session  2. Gentle posterior shoulder rolls x 6, seated at end of session      Gentle posterior shoulder rolls x 6, seated 1. Cervical rotation x 5 seated B pre goni     2. Cont gentle posterior shoulder rolls next visit   1. Cervical rotation x 5 B seated, noted increasing range with each rep (post manual)    Passive spine and pect stretch   Supine in bed 2 towels rolled longwise lay on towel for 2 mins --  Supine in bed 2 half foam roll longwise lay on towel for 2 mins      Prone scap retraction    UE resting on bed AAROM by LPTA 10x2  Rows with YTB 1x15 in standing with v cues for technique and posture Next visit Prone 10x BUE       Prone press up back extension   10x AAROM   100% cues to decrease neck extension  --  10x2 w/ 25% cues      Prone shoulder extension   10x 2 BUE  --  10x2 BUE 2#      Lats pull downs      Plate 3 60A      Shoulder 90/90 retraction      Seated 10x 0# 25% tactile and visual cues for thoracic and lumbar extension      Lawn moyer pulls w/ cervical rotation      10x Bly p! On L/side rotation compensate w/ trunk rotation      SCM stretch         Standing using 1 UE for support at base of skull 1 rep B x 10" each     ROM  Right  AROM 6/16 Left  AROM 6/16 Right AROM 05/06/14 Left AROM 05/06/14   Extension  15*  na  -- na   Rotation  25*  25*  9 deg 22 deg       ROM  Right  AROM 05/14/14 Left  AROM  05/14/14   Extension  35* na   Rotation  34 25*       Functional Activities: n/a    Manual Therapy: Supine: STM and DTM to cervical subocciptials, erector spinae, UT, SCM and levator. Noted increased excessive tension in R UT and R SCM, pt very TTP at B UT TPs. Applied kinesiotaping in sitting; B UT to faciliate relaxation (tape from acromion to suboccipitals with 0% stretch), along Cervical erector spinae to facilitate alignment (anchored tape around T3 and split Y up alongside spinous processes to subocciput), then another piece anchored on each side laterally with 25% stretch in middle at base of skull covering ends for tape stability and suboccipital stimulation.    Patient Education: Pt educated on progress with POC. Pt educated on effects of kinesiotaping and to wear up to 4 days, can shower, may have slight itching along tape but remove if irritable.     Current HEP Dosage 05/06/14   Levator stretch 2 reps B BID 10-30" as tolerated Cont   UT stretch 2 reps B BID 10-30" as tolerated Cont   Chin tucks/retraction 10 x 5-10" every other day Cont   Chin tuck with rotation 10 B x 5-10" every other day Cont   rows YTB 2x10 every other day Cont   Cervical rotation ROM w/ visualization  3 reps pure visualization, f/b 10 reps visualization with motion on both sides, BID       Other: PTA has reviewed chart and  PT Eval for POC prior to treatment.        Assessment: Pt continues to present with persistent neck pain, however greater on R than L today and increased tension noted in B UT and SCM. Pt challenged with avoiding hyperextension of neck during standing TE and continues to exhibit severely decreased cervical rotation ROM. Noted lateral flexion of spinous process to R and increased forward slip of cervical spine around C6-T1 during taping. Increasing symptoms and decreasing improvements indicate that pt may have to reconsider surgery (pt previously indicated this an option proposed by MD). At this time, pt cont to have  decreased function and participation due to neck symptoms, requiring continued physical therapy.       Progress towards goals:    Short Term Goals:  (in  3    weeks)  1. Pt demo Independent with HEP and symptom management -PROGRESSING  2. Pt will decreased pain at worst to 5/10. - PROGRESSING (04/16/14- 10/10, 05/14/14- 7/10)      Long Term Goals:   (in   6   weeks)  1. Pt will improve NDI to <20% to decreased disability of neck. (gcode)- PROGRESSING (04/16/14- 58%, 05/14/14- 36%)  2. Pt will improve cervical extension AROM to 58 to improve safe driving.- PROGRESSING  3. Pt will improve cervical rotation AROM to 55 b/l to improve safe driving. -PROGRESSING  4. Pt will lift 30# from floor with no onset of pain.- PROGRESSING (04/16/14- unable, 05/14/14- 12# with no pain)      Plan: Cont PT POC; Focus on advancing cervical TE to standing and prone to maximize function of neck. Educate pt on progression with therapy based on pt response at next visit and revisit surgurical option as appropriate.  Adjustments to POC:      Charges Minutes   Ther ex 10   Ther act    Performance Test    Gait training    Neuro Re-Ed    Manual 20   [place]    [place]      Lear Ng, Kell  [place] Lic #[id number]  [facility]  Phone [phone]

## 2014-05-21 ENCOUNTER — Ambulatory Visit
Admission: RE | Admit: 2014-05-21 | Discharge: 2014-05-21 | Disposition: A | Discharge status: Home / Self Care | Payer: Medicare Other | Source: Ambulatory Visit

## 2014-05-21 NOTE — PT Plan of Care Note (Signed)
[facility]  Physical Therapy Treatment Note    Patient:   [name]                                                        MRN#: [id number]  Date: 05/21/2014  # of Visits: 12    Time of treatment:   Start Time:     1030          Stop Time:   1115    Subjective:   "I think the taping has helped" "it feels good" "The ringing in my ear stopped"      Objective/Measurements:     05/01/14 05/06/14 05/08/14 05/14/14 05/15/14 05/20/14 05/21/14   UBE  UBE 3/3 mins fwd and bwd w/ MHP on cervical neck with 5 layers of towel for 10 minutes to improve muscle pliability UBE 3/3 mins fwd and bwd w/ MHP on cervical neck with 4 layers of towel for 10 minutes to improve muscle pliability UBE 4/4 mins fwd and bwd UBE 3/3 mins fwd and bwd UBE 3/3 mins fwd and bwd UBE 3/3 mins fwd and bwd UBE 3/3 mins fwd and bwd   Upper cervical flexion "chin tucks' Supine 10x 5" towel roll under neck sub max contraction, cues for technique Supine 10x 5" towel roll under neck sub max contraction, cues for technique --- Supine 10x B, 5" hold   Standing x 10 reps  with towel behind head, cues for cervical retraction and flexion  Standing x 10 reps  with towel behind head, cues for cervical retraction and flexion    Deep cervical stabilization- rotation with chin tuck- "3 finger between chin and chest"  Supine 10x B, 5" hold,cues for nod yes by moving the chin Supine 10x B, 5" hold,cues for nod yes by moving the chin -- Supine 10x B, 5" hold  Standing  x 10 reps  with towel behind head, cues for cervical retraction and flexion Standing  x 10 reps  with towel behind head, cues for cervical retraction and flexion   Deep cervical isometrics- rotation with manual resistance   With towel roll under neck in vertical position 1x10 With towel roll under neck in vertical position 5" sub max hold x10        levator stretch 20 sec x 2, B seated HEP        UT stretch 20 sec x 2, B seated with hands behind back HEP        Gentle AROM    Gentle posterior  shoulder rolls x 6, seated 1. Cervical rotation x 5 seated B pre goni     2. Cont gentle posterior shoulder rolls next visit   1. Cervical rotation x 5 B seated, noted increasing range with each rep (post manual)     Passive spine and pect stretch --  Supine in bed 2 half foam roll longwise lay on towel for 2 mins       Prone scap retraction  Rows with YTB 1x15 in standing with v cues for technique and posture Next visit Prone 10x BUE        Prone press up back extension --  10x2 w/ 25% cues       Prone shoulder extension --  10x2 BUE 2#       Lats pull  downs   Plate 3 96E       Shoulder 90/90 retraction   Seated 10x 0# 25% tactile and visual cues for thoracic and lumbar extension       Lawn moyer pulls w/ cervical rotation   10x Bly p! On L/side rotation compensate w/ trunk rotation       SCM stretch      Standing using 1 UE for support at base of skull 1 rep B x 10" each      ROM  Right  AROM 6/16 Left  AROM 6/16 Right AROM 05/06/14 Left AROM 05/06/14   Extension  15*  na  -- na   Rotation  25*  25*  9 deg 22 deg       ROM  Right  AROM 05/14/14 Left  AROM 05/14/14   Extension  35* na   Rotation  34 25*     Functional Activities: n/a    Manual Therapy: Supine: STM to cervical suboccipitals over tape, erector spinae, UT and scalenes, Increased tension to R UT and R SCM, TTP left scalenes.     Patient Education: Pt educated on progress with POC. Pt educated on effects of kinesiotaping and to wear up to 4 days, can shower, may have slight itching along tape but remove if irritable.     Current HEP Dosage 05/06/14   Levator stretch 2 reps B BID 10-30" as tolerated Cont   UT stretch 2 reps B BID 10-30" as tolerated Cont   Chin tucks/retraction 10 x 5-10" every other day Cont   Chin tuck with rotation 10 B x 5-10" every other day Cont   rows YTB 2x10 every other day Cont   Cervical rotation ROM w/ visualization  3 reps pure visualization, f/b 10 reps visualization with motion on both sides, BID     Other:     Assessment:  Continues to present with persistent neck pain. Reduced cervical AROM rotation, retraction and side-bending. Slight compensatory shoulder and trunk movement. Pain relief following manual STM. Slight improvement in ROM following manual and repeated exercises. No HA or ringing present at end of session. Subjective improvement reported in pain and ROM. Tolerated session well. Continue PT services to improve AROM, decrease neck symptoms and improve function.    Progress towards goals:    Short Term Goals:  (in  3    weeks)  1. Pt demo Independent with HEP and symptom management -PROGRESSING  2. Pt will decreased pain at worst to 5/10. - PROGRESSING (04/16/14- 10/10, 05/14/14- 7/10)      Long Term Goals:   (in   6   weeks)  1. Pt will improve NDI to <20% to decreased disability of neck. (gcode)- PROGRESSING (04/16/14- 58%, 05/14/14- 36%)  2. Pt will improve cervical extension AROM to 58 to improve safe driving.- PROGRESSING  3. Pt will improve cervical rotation AROM to 55 b/l to improve safe driving. -PROGRESSING  4. Pt will lift 30# from floor with no onset of pain.- PROGRESSING (04/16/14- unable, 05/14/14- 12# with no pain)    Plan: Cont PT POC; Focus on advancing cervical TE to standing and prone to maximize function of neck. Educate pt on progression with therapy based on pt response at next visit and revisit surgurical option as appropriate.  Adjustments to POC:      Charges Minutes   Ther ex 15   Ther act    Performance Test    Gait training    Neuro Re-Ed  Manual 30   Ionto    [place]      Frederica Kuster, DPT  [facility]  License Number: [id number]  Phone: [phone]

## 2014-05-22 ENCOUNTER — Ambulatory Visit
Admission: RE | Admit: 2014-05-22 | Discharge: 2014-05-22 | Disposition: A | Discharge status: Home / Self Care | Payer: Medicare Other | Source: Ambulatory Visit

## 2014-05-22 NOTE — PT Plan of Care Note (Signed)
[facility]  Physical Therapy Treatment Note    Patient:   [name]                                                        MRN#: [id number]  Date: 05/22/2014  # of Visits: 12    Time of treatment:   Start Time:     1052       Stop Time:   1130    Subjective:   "I feel very good. Better. 5/10 pain"      Objective/Measurements:     05/01/14 05/06/14 05/08/14 05/14/14 05/15/14 05/20/14 05/21/14 7/22   UBE  UBE 3/3 mins fwd and bwd w/ MHP on cervical neck with 5 layers of towel for 10 minutes to improve muscle pliability UBE 3/3 mins fwd and bwd w/ MHP on cervical neck with 4 layers of towel for 10 minutes to improve muscle pliability UBE 4/4 mins fwd and bwd UBE 3/3 mins fwd and bwd UBE 3/3 mins fwd and bwd UBE 3/3 mins fwd and bwd UBE 3/3 mins fwd and bwd UBE 3/3 mins fwd and bwd   Upper cervical flexion "chin tucks' Supine 10x 5" towel roll under neck sub max contraction, cues for technique Supine 10x 5" towel roll under neck sub max contraction, cues for technique --- Supine 10x B, 5" hold   Standing x 10 reps  with towel behind head, cues for cervical retraction and flexion  Standing x 10 reps  with towel behind head, cues for cervical retraction and flexion  Seated 5" hold x 10,cues to prevent hyperextension with relaxation    Standing holding cane behind spine, cues for retraction and not flexion/extension x 10    Standing hips flexed with UEs on  plinth therapist holding cane along spine and pt holding ball under chin, cues for retraction until head meets cane and not flexion/extension x 10     Deep cervical stabilization- lateral flexion n with chin tuck- "3 finger between chin and chest"         Standing holding cane behind back for posture   x 10 reps B   Deep cervical stabilization- rotation with chin tuck- "3 finger between chin and chest"  Supine 10x B, 5" hold,cues for nod yes by moving the chin Supine 10x B, 5" hold,cues for nod yes by moving the chin -- Supine 10x B, 5" hold  Standing  x 10 reps   with towel behind head, cues for cervical retraction and flexion Standing  x 10 reps  with towel behind head, cues for cervical retraction and flexion Seated x 10 repsm B with visualization, fist for proper head placement   Deep cervical isometrics- rotation with manual resistance   With towel roll under neck in vertical position 1x10 With towel roll under neck in vertical position 5" sub max hold x10         levator stretch 20 sec x 2, B seated HEP         UT stretch 20 sec x 2, B seated with hands behind back HEP         Gentle AROM    Gentle posterior shoulder rolls x 6, seated 1. Cervical rotation x 5 seated B pre goni     2. Cont gentle posterior shoulder rolls next  visit   1. Cervical rotation x 5 B seated, noted increasing range with each rep (post manual)      Passive spine and pect stretch --  Supine in bed 2 half foam roll longwise lay on towel for 2 mins        Prone scap retraction  Rows with YTB 1x15 in standing with v cues for technique and posture Next visit Prone 10x BUE      Standing against wall, cues for head posture with cervical retraction, 5" hold x 10   Prone press up back extension --  10x2 w/ 25% cues        Prone shoulder extension --  10x2 BUE 2#        Lats pull downs   Plate 3 32G        Shoulder 90/90 retraction   Seated 10x 0# 25% tactile and visual cues for thoracic and lumbar extension        Lawn moyer pulls w/ cervical rotation   10x Bly p! On L/side rotation compensate w/ trunk rotation        SCM stretch      Standing using 1 UE for support at base of skull 1 rep B x 10" each        ROM  Right  AROM 6/16 Left  AROM 6/16 Right AROM 05/06/14 Left AROM 05/06/14   Extension  15*  na  -- na   Rotation  25*  25*  9 deg 22 deg       ROM  Right  AROM 05/14/14 Left  AROM 05/14/14 Right  AROM 05/22/14 Left  AROM 05/22/14   Extension  35* na   20 n/a   Rotation  34 25* 30  22     Functional Activities: n/a    Manual Therapy:  -Assessed kinesiotape: lateral piece at subocciput of skull coming off;  took off and replaced.    Patient Education: Pt educated on progress with POC. Pt educated on posture in sitting, standing, and flexed hip positions. Pt educated on benefits of TE for spinal stabilization and postural mechanics.     Current HEP Dosage 05/06/14   Levator stretch 2 reps B BID 10-30" as tolerated Cont   UT stretch 2 reps B BID 10-30" as tolerated Cont   Chin tucks/retraction 10 x 5-10" every other day Cont   Chin tuck with rotation 10 B x 5-10" every other day Cont   rows YTB 2x10 every other day Cont   Cervical rotation ROM w/ visualization  3 reps pure visualization, f/b 10 reps visualization with motion on both sides, BID     Other: PTA has reviewed chart and PT Eval for POC prior to treatment.      Assessment: Goni measurements continue to support that pt has decreased ROM in cervical extension and rotation. ROM values fluctuate slightly but remain fairly consistent which may be attributed to intra-rater reliability or manual work and postural stress pt encounters. Focused on TE today with kinesiotape still applied to maximize long term improvements in function from strengthening weak neck musculature. Focused on educating patient on how to find neutral neck posture in various positions; pt challenged with performing correct cervical retraction without extension especially when flexed at hips over table and also in vertical postures. Pt requires extensive tactile and verbal cuing and demonstration for correct head and neck postures with TE. Continue PT services to improve AROM, decrease neck symptoms and improve function.  Progress towards goals:    Short Term Goals:  (in  3    weeks)  1. Pt demo Independent with HEP and symptom management -PROGRESSING  2. Pt will decreased pain at worst to 5/10. - PROGRESSING (04/16/14- 10/10, 05/14/14- 7/10)      Long Term Goals:   (in   6   weeks)  1. Pt will improve NDI to <20% to decreased disability of neck. (gcode)- PROGRESSING (04/16/14- 58%, 05/14/14- 36%)  2.  Pt will improve cervical extension AROM to 58 to improve safe driving.- PROGRESSING  3. Pt will improve cervical rotation AROM to 55 b/l to improve safe driving. -PROGRESSING  4. Pt will lift 30# from floor with no onset of pain.- PROGRESSING (04/16/14- unable, 05/14/14- 12# with no pain)    Plan: Cont PT POC; Focus on postural awareness and advancing cervical TE to standing and prone to maximize function of neck.     Adjustments to POC:      Charges Minutes   Ther ex 38   Ther act    Performance Test    Gait training    Neuro Re-Ed    Manual    [place]    [place]          Stefano Gaul  [place] Lic #[id number]  [facility]  Phone [phone]

## 2014-05-23 ENCOUNTER — Ambulatory Visit
Admission: RE | Admit: 2014-05-23 | Discharge: 2014-05-23 | Disposition: A | Discharge status: Home / Self Care | Payer: Medicare Other | Source: Ambulatory Visit

## 2014-05-23 NOTE — PT Plan of Care Note (Signed)
[facility]  Physical Therapy Treatment Note    Patient:   [name]                                                        MRN#: [id number]  Date: 05/23/2014  # of Visits: 14    Time of treatment:   Start Time:     1135      Stop Time:   1217    Subjective:   "I feel very good. Better. 5/10 pain" Pt reports no changes since yesterday.      Objective/Measurements:     05/01/14 05/06/14 05/08/14 05/14/14 05/15/14 05/20/14 05/21/14 7/22 7/23   UBE  UBE 3/3 mins fwd and bwd w/ MHP on cervical neck with 5 layers of towel for 10 minutes to improve muscle pliability UBE 3/3 mins fwd and bwd w/ MHP on cervical neck with 4 layers of towel for 10 minutes to improve muscle pliability UBE 4/4 mins fwd and bwd UBE 3/3 mins fwd and bwd UBE 3/3 mins fwd and bwd UBE 3/3 mins fwd and bwd UBE 3/3 mins fwd and bwd UBE 3/3 mins fwd and bwd UBE 3/3 mins fwd and bwd   Upper cervical flexion "chin tucks' Supine 10x 5" towel roll under neck sub max contraction, cues for technique Supine 10x 5" towel roll under neck sub max contraction, cues for technique --- Supine 10x B, 5" hold   Standing x 10 reps  with towel behind head, cues for cervical retraction and flexion  Standing x 10 reps  with towel behind head, cues for cervical retraction and flexion  Seated 5" hold x 10,cues to prevent hyperextension with relaxation    Standing holding cane behind spine, cues for retraction and not flexion/extension x 10    Standing hips flexed with UEs on  plinth therapist holding cane along spine and pt holding ball under chin, cues for retraction until head meets cane and not flexion/extension x 10   Standing holding cane behind spine for posture and in front of mirror, TCs cues for retraction  5" x 10   Deep cervical stabilization- lateral flexion n with chin tuck- "3 finger between chin and chest"         Standing holding cane behind back for posture   x 10 reps B Standing holding cane behind spine for posture and in front of mirror, TCs cues  for retraction  5" x 10   Deep cervical stabilization- rotation with chin tuck- "3 finger between chin and chest"  Supine 10x B, 5" hold,cues for nod yes by moving the chin Supine 10x B, 5" hold,cues for nod yes by moving the chin -- Supine 10x B, 5" hold  Standing  x 10 reps  with towel behind head, cues for cervical retraction and flexion Standing  x 10 reps  with towel behind head, cues for cervical retraction and flexion Seated x 10 reps B with visualization, fist for proper head placement Standing holding cane behind spine for posture and in front of mirror, TCs cues for retraction  5" x 10   Deep cervical isometrics- rotation with manual resistance   With towel roll under neck in vertical position 1x10 With towel roll under neck in vertical position 5" sub max hold x10  levator stretch 20 sec x 2, B seated HEP          UT stretch 20 sec x 2, B seated with hands behind back HEP       1 x 15" each seated   Gentle AROM    Gentle posterior shoulder rolls x 6, seated 1. Cervical rotation x 5 seated B pre goni     2. Cont gentle posterior shoulder rolls next visit   1. Cervical rotation x 5 B seated, noted increasing range with each rep (post manual)       Passive spine and pect stretch --  Supine in bed 2 half foam roll longwise lay on towel for 2 mins         Prone scap retraction  Rows with YTB 1x15 in standing with v cues for technique and posture Next visit Prone 10x BUE      Standing against wall, cues for head posture with cervical retraction, 5" hold x 10 Standing against wall, cues for head posture with cervical retraction, YTB for GH ER, 5" hold x 10   Prone press up back extension --  10x2 w/ 25% cues         Prone shoulder extension --  10x2 BUE 2#         Lats pull downs   Plate 3 16X         Shoulder 90/90 retraction   Seated 10x 0# 25% tactile and visual cues for thoracic and lumbar extension         Lawn moyer pulls w/ cervical rotation   10x Bly p! On L/side rotation compensate w/ trunk  rotation         SCM stretch      Standing using 1 UE for support at base of skull 1 rep B x 10" each       Scalene stretch         1 x 15" each Seated   "W" into back pocket         5" x 10 focusing on cervical posture     ROM  Right  AROM 6/16 Left  AROM 6/16 Right AROM 05/06/14 Left AROM 05/06/14   Extension  15*  na  -- na   Rotation  25*  25*  9 deg 22 deg       ROM  Right  AROM 05/14/14 Left  AROM 05/14/14 Right  AROM 05/22/14 Left  AROM 05/22/14   Extension  35* na   20 n/a   Rotation  34 25* 30  22     Functional Activities: n/a    Manual Therapy:  -Assessed kinesiotape: removed all pieces of tape, cleaned skin with alcohol wipes. Deferred reapplication to give skin a break and assess pt's response over weekend.  -STM to B UT, levator, cervical erector spinae, SCM, scalenes in supine with deep tissue prep 2 cream; noted decreased tension in all muscles with UT TP near normal.  -B PROM lateral flexion and flexion with upward rotation with pt very tender to lateral flexion to R.   -suboccipital release 30" x 1  -Seated accessory nerve stretch - started with AAROM shoulder protract/retraction B x 5 reps, then AAROM cervical retraction/protraction, then AAROM opposite shoulder/head movement at same time. Ended with scalene stretch in flow chart.      Patient Education: Pt educated on progress with POC; encouraged pt to continue to focus on proper neck posture with ADLs. Educated pt on  progression with decreasing muscular tension, and increasing strengthening however, pt may need to reconsider surgery.     Current HEP Dosage 05/06/14   Levator stretch 2 reps B BID 10-30" as tolerated Cont   UT stretch 2 reps B BID 10-30" as tolerated Cont   Chin tucks/retraction 10 x 5-10" every other day Cont   Chin tuck with rotation 10 B x 5-10" every other day Cont   rows YTB 2x10 every other day Cont   Cervical rotation ROM w/ visualization  3 reps pure visualization, f/b 10 reps visualization with motion on both sides, BID     Other:  PTA has reviewed chart and PT Eval for POC prior to treatment.       Assessment: Kinesiotaping seems to have improved muscular tension but pt still has tender response jerk indicating nerve involvement with cervical lateral flexion and rotation. Pt continues to require extensive tactile and verbal cuing and demonstration for correct head and neck postures with TE. Despite muscular improvements and increasing TE challenge; pt continues to have consistent pain level with limited ROM that stagnates and fluctuates at times indicating that pt may need further intervention beyond PT. Pt is resistant to surgery as an option. Continue PT services to improve AROM, decrease neck symptoms and improve function.    Progress towards goals:    Short Term Goals:  (in  3    weeks)  1. Pt demo Independent with HEP and symptom management -PROGRESSING  2. Pt will decreased pain at worst to 5/10. - PROGRESSING (04/16/14- 10/10, 05/14/14- 7/10)      Long Term Goals:   (in   6   weeks)  1. Pt will improve NDI to <20% to decreased disability of neck. (gcode)- PROGRESSING (04/16/14- 58%, 05/14/14- 36%)  2. Pt will improve cervical extension AROM to 58 to improve safe driving.- PROGRESSING  3. Pt will improve cervical rotation AROM to 55 b/l to improve safe driving. -PROGRESSING  4. Pt will lift 30# from floor with no onset of pain.- PROGRESSING (04/16/14- unable, 05/14/14- 12# with no pain)    Plan: Cont PT POC; Focus on postural awareness and advancing cervical TE to standing and prone to maximize function of neck. Revisit kinesiotaping if muscular tension in posterior neck returns.    Adjustments to POC:      Charges Minutes   Ther ex 27   Ther act    Performance Test    Gait training    Neuro Re-Ed    Manual 15   [place]    [place]          Lear Ng, [place]  [place] Lic #[id number]  [facility]  Phone [phone]

## 2014-05-27 ENCOUNTER — Ambulatory Visit
Admission: RE | Admit: 2014-05-27 | Discharge: 2014-05-27 | Disposition: A | Discharge status: Home / Self Care | Payer: Medicare Other | Source: Ambulatory Visit

## 2014-05-27 NOTE — PT Plan of Care Note (Signed)
[facility]  Physical Therapy Treatment Note    Patient:   [name]                                                        MRN#: [id number]  Date: 05/27/2014  # of Visits: 15    Time of treatment:   Start Time:     1118      Stop Time:   1150    Subjective:   6/10 cervical neck pain.       Objective/Measurements:     05/08/14 05/14/14 05/15/14 05/20/14 05/21/14 7/22 7/23 05/27/14   UBE  UBE 4/4 mins fwd and bwd UBE 3/3 mins fwd and bwd UBE 3/3 mins fwd and bwd UBE 3/3 mins fwd and bwd UBE 3/3 mins fwd and bwd UBE 3/3 mins fwd and bwd UBE 3/3 mins fwd and bwd sci fit UE only lvl 1.5 3/3 Fwd Bwd instructed for upright posture   Upper cervical flexion "chin tucks' --- Supine 10x B, 5" hold   Standing x 10 reps  with towel behind head, cues for cervical retraction and flexion  Standing x 10 reps  with towel behind head, cues for cervical retraction and flexion  Seated 5" hold x 10,cues to prevent hyperextension with relaxation    Standing holding cane behind spine, cues for retraction and not flexion/extension x 10    Standing hips flexed with UEs on  plinth therapist holding cane along spine and pt holding ball under chin, cues for retraction until head meets cane and not flexion/extension x 10   Standing holding cane behind spine for posture and in front of mirror, TCs cues for retraction  5" x 10    Deep cervical stabilization- lateral flexion n with chin tuck- "3 finger between chin and chest"       Standing holding cane behind back for posture   x 10 reps B Standing holding cane behind spine for posture and in front of mirror, TCs cues for retraction  5" x 10    Deep cervical stabilization- rotation with chin tuck- "3 finger between chin and chest"  -- Supine 10x B, 5" hold  Standing  x 10 reps  with towel behind head, cues for cervical retraction and flexion Standing  x 10 reps  with towel behind head, cues for cervical retraction and flexion Seated x 10 reps B with visualization, fist for proper head  placement Standing holding cane behind spine for posture and in front of mirror, TCs cues for retraction  5" x 10    UT stretch       1 x 15" each seated    Gentle AROM   1. Cervical rotation x 5 B seated, noted increasing range with each rep (post manual)        Passive spine and pect stretch Supine in bed 2 half foam roll longwise lay on towel for 2 mins       Supine in bed 2 half foam roll longwise lay on towel for 2 mins   Prone scap retraction  Prone 10x BUE      Standing against wall, cues for head posture with cervical retraction, 5" hold x 10 Standing against wall, cues for head posture with cervical retraction, YTB for GH ER, 5" hold x 10 Standing  against wall, cues for head posture with cervical retraction, RTB rows, 90/90 rows,    Prone press up back extension 10x2 w/ 25% cues          Prone shoulder extension 10x2 BUE 2#          Lats pull downs Plate 3 09W          Shoulder 90/90 retraction Seated 10x 0# 25% tactile and visual cues for thoracic and lumbar extension       Against wall AROM retraction to wall and end with ER of shoulder   Lawn moyer pulls w/ cervical rotation 10x Bly p! On L/side rotation compensate w/ trunk rotation          SCM stretch    Standing using 1 UE for support at base of skull 1 rep B x 10" each        Scalene stretch       1 x 15" each Seated    "W" into back pocket       5" x 10 focusing on cervical posture    Standing flexion against wall w/ cervical head neutral         3# cane 10x overhead flexion 25% cues    Chest press 3# 10x    Wall angels        Attempted w/ theraband unable to tolerate.   AROM 3x pain L/UT   Seated theraball interventions        Bicep curls 10x 2# BUE focus on cervical and thoracic posture    90/90 rows w/ 2# BUE 10x    Alter UE flexion 2# 10x each      ROM  Right  AROM 6/16 Left  AROM 6/16 Right AROM 05/06/14 Left AROM 05/06/14   Extension  15*  na  -- na   Rotation  25*  25*  9 deg 22 deg       ROM  Right  AROM 05/14/14 Left  AROM 05/14/14 Right  AROM  05/22/14 Left  AROM 05/22/14   Extension  35* na   20 n/a   Rotation  34 25* 30  22     Functional Activities: n/a    Manual Therapy:       Patient Education: Pt educated on progress with POC; encouraged pt to continue to focus on proper neck posture with ADLs. Educated pt on progression with decreasing muscular tension, and increasing strengthening however, pt may need to reconsider surgery.     Current HEP Dosage 05/06/14   Levator stretch 2 reps B BID 10-30" as tolerated Cont   UT stretch 2 reps B BID 10-30" as tolerated Cont   Chin tucks/retraction 10 x 5-10" every other day Cont   Chin tuck with rotation 10 B x 5-10" every other day Cont   rows YTB 2x10 every other day Cont   Cervical rotation ROM w/ visualization  3 reps pure visualization, f/b 10 reps visualization with motion on both sides, BID     Other: PTA has reviewed chart and PT Eval for POC prior to treatment.       Assessment: When LPTA retrieved pt from waiting room noted slumped sitting posture with pt sitting on sacrum and WB through thoracic spine while using cell phone. LPTA spend tx session educating pt on posture awareness with seated ADLs, focusing therex on postural interventions to increase strength of targeted muscles. Utilized [employer] for [employer]. Pt requested to end therapy session 10 minutes  early 2' to muscle fatigue and cervical neck pain at end of session. Offered pain management modality of ESTIM to address pain, pt declined. Pt continues to require extensive tactile and verbal cuing and demonstration for correct head and neck postures with TE. Despite muscular improvements and increasing TE challenge; pt continues to have consistent pain level with limited ROM that fluctuates at times indicating that pt may need further intervention beyond PT. Pt's continued PT progress is guarded 2' to continued pain despite interventions provided. Primary PT informed.     Progress towards goals:    Short Term Goals:  (in  3    weeks)  1. Pt  demo Independent with HEP and symptom management -PROGRESSING  2. Pt will decreased pain at worst to 5/10. - PROGRESSING (04/16/14- 10/10, 05/14/14- 7/10)      Long Term Goals:   (in   6   weeks)  1. Pt will improve NDI to <20% to decreased disability of neck. (gcode)- PROGRESSING (04/16/14- 58%, 05/14/14- 36%)  2. Pt will improve cervical extension AROM to 58 to improve safe driving.- PROGRESSING  3. Pt will improve cervical rotation AROM to 55 b/l to improve safe driving. -PROGRESSING  4. Pt will lift 30# from floor with no onset of pain.- PROGRESSING (04/16/14- unable, 05/14/14- 12# with no pain)    Plan: Cont PT POC; Focus on postural awareness and advancing cervical TE to standing and prone to maximize function of neck. Revisit kinesiotaping if muscular tension in posterior neck returns.    Adjustments to POC:      Charges Minutes   Ther ex 32   Ther act    Performance Test    Gait training    Neuro Re-Ed    Manual    [place]    [place]          Daine Gip, [place]   [place] # [id number]    [facility]  Physical Medicine and Rehabilitation Department  [email or web address]  [phone] Main office

## 2014-05-29 ENCOUNTER — Ambulatory Visit
Admission: RE | Admit: 2014-05-29 | Discharge: 2014-05-29 | Disposition: A | Discharge status: Home / Self Care | Payer: Medicare Other | Source: Ambulatory Visit

## 2014-05-29 NOTE — PT Plan of Care Note (Signed)
[facility]  Physical Therapy Treatment Note    Patient:   [name]                                                        MRN#: [id number]  Date: 05/29/2014  # of Visits: 16    Time of treatment:   Start Time:     1132     Stop Time:   1214    Subjective:   7/10 cervical neck pain on L "I can't move it. This (oxycodone-acetiminophren) works well. Started hurting a lot yesterday." Pt c/o of R ear ringing and HA at crown and L parietal area. Post session pt rates pain 5/10 with no HA or ear ringing.       Objective/Measurements:     05/08/14 05/14/14 05/15/14 05/20/14 05/21/14 7/22 7/23 05/27/14 05/29/14   UBE  UBE 4/4 mins fwd and bwd UBE 3/3 mins fwd and bwd UBE 3/3 mins fwd and bwd UBE 3/3 mins fwd and bwd UBE 3/3 mins fwd and bwd UBE 3/3 mins fwd and bwd UBE 3/3 mins fwd and bwd sci fit UE only lvl 1.5 3/3 Fwd Bwd instructed for upright posture sci fit UE only lvl 1.5 3/3 Fwd Bwd instructed for upright posture   Upper cervical flexion "chin tucks' --- Supine 10x B, 5" hold   Standing x 10 reps  with towel behind head, cues for cervical retraction and flexion  Standing x 10 reps  with towel behind head, cues for cervical retraction and flexion  Seated 5" hold x 10,cues to prevent hyperextension with relaxation    Standing holding cane behind spine, cues for retraction and not flexion/extension x 10    Standing hips flexed with UEs on  plinth therapist holding cane along spine and pt holding ball under chin, cues for retraction until head meets cane and not flexion/extension x 10   Standing holding cane behind spine for posture and in front of mirror, TCs cues for retraction  5" x 10     Deep cervical stabilization- lateral flexion n with chin tuck- "3 finger between chin and chest"       Standing holding cane behind back for posture   x 10 reps B Standing holding cane behind spine for posture and in front of mirror, TCs cues for retraction  5" x 10     Deep cervical stabilization- rotation with chin  tuck- "3 finger between chin and chest"  -- Supine 10x B, 5" hold  Standing  x 10 reps  with towel behind head, cues for cervical retraction and flexion Standing  x 10 reps  with towel behind head, cues for cervical retraction and flexion Seated x 10 reps B with visualization, fist for proper head placement Standing holding cane behind spine for posture and in front of mirror, TCs cues for retraction  5" x 10     UT stretch       1 x 15" each seated     Gentle AROM   1. Cervical rotation x 5 B seated, noted increasing range with each rep (post manual)         Passive spine and pect stretch Supine in bed 2 half foam roll longwise lay on towel for 2 mins       Supine in  bed 2 half foam roll longwise lay on towel for 2 mins    Prone scap retraction  Prone 10x BUE      Standing against wall, cues for head posture with cervical retraction, 5" hold x 10 Standing against wall, cues for head posture with cervical retraction, YTB for GH ER, 5" hold x 10 Standing against wall, cues for head posture with cervical retraction, RTB rows, 90/90 rows,  Standing against wall, cues for head posture with cervical retraction, RTB B UE bent elbow rows 5" hold x 10   Prone press up back extension 10x2 w/ 25% cues           Prone shoulder extension 10x2 BUE 2#           Lats pull downs Plate 3 04V           Shoulder 90/90 retraction Seated 10x 0# 25% tactile and visual cues for thoracic and lumbar extension       Against wall AROM retraction to wall and end with ER of shoulder RTB B UE x 10 reps standing against wall for 90/90     Standing B UE retraction to wall and end with ER of shoulder RTB x 10reps with constant cuing for form   [employer] pulls w/ cervical rotation 10x Bly p! On L/side rotation compensate w/ trunk rotation           SCM stretch    Standing using 1 UE for support at base of skull 1 rep B x 10" each         Scalene stretch       1 x 15" each Seated     "W" into back pocket       5" x 10 focusing on cervical posture   5" x 10 focusing on cervical posture against wall, provided TCs to find posture   Standing flexion against wall w/ cervical head neutral         3# cane 10x overhead flexion 25% cues    Chest press 3# 10x     Wall angels        Attempted w/ theraband unable to tolerate.   AROM 3x pain L/UT    Seated theraball interventions        Bicep curls 10x 2# BUE focus on cervical and thoracic posture    90/90 rows w/ 2# BUE 10x    Alter UE flexion 2# 10x each       ROM  Right  AROM 6/16 Left  AROM 6/16 Right AROM 05/06/14 Left AROM 05/06/14   Extension  15*  na  -- na   Rotation  25*  25*  9 deg 22 deg       ROM  Right  AROM 05/14/14 Left  AROM 05/14/14 Right  AROM 05/22/14 Left  AROM 05/22/14   Extension  35* na   20 n/a   Rotation  34 25* 30  22     Functional Activities: n/a    Manual Therapy:   -At initiation of session: bserved pt with decreased forward head posture but also increased guarding with decreased cervical AROM.   -STM/DTM with pt supine to B UT, cervical erector spinae, subocciptals, SCM, scalenes; used deep prep cream.  -PROM into cervical lateral flexion and rotation B and flexion during STM/DTM  -Kinesiotaping (cleaned skin with alcohol wipes first): 1 piece each to B UT from acromions to subocciput with 0% stretch to promote UT relaxation. 1  piece laterally anchored at B scapula with 50% stretch with shoulders retracted, then pressed down with shoulders protracted to improve postural awareness. 1 piece each B along transverse processes anchored at subocciput with 50% stretch to about T6 to promote postural awareness. 1 piece laterally at base of subocciput applied center out to offer suboccipital awareness and prevent other pieces from pulling off at base of skull.        Patient Education: Pt educated on next visit and to wear kinesiotaping up to 4 days, can shower, no lotions, and possible side effects.     Current HEP Dosage 05/06/14   Levator stretch 2 reps B BID 10-30" as tolerated Cont   UT stretch 2 reps B  BID 10-30" as tolerated Cont   Chin tucks/retraction 10 x 5-10" every other day Cont   Chin tuck with rotation 10 B x 5-10" every other day Cont   rows YTB 2x10 every other day Cont   Cervical rotation ROM w/ visualization  3 reps pure visualization, f/b 10 reps visualization with motion on both sides, BID     Other: PTA has reviewed chart and PT Eval for POC prior to treatment.       Assessment: Focused today on managing pain control as pt reporting increased pain, increased symptoms, and decreased mobility, and use of oxycodone for pain relief. Pt with favorable response to manual as evidenced by pt endorsing dec pain levels and no HA or tinnitus at end of session. Pt also demonstrated for therapist cervical rotation B with lateral flexion compensation, however, pt unable to move neck at initiation of session. During manual, pt also permitted therapist to move head and neck in greater PROM than previous visits. Pt is progressing very slowly; able to tolerate increased challenge with TE; however, modified from previous visit to reduce chance of exacerbation of symptoms. Pt would benefit from continued PT to improve cervical ROM, posture, and strengthening to maximize ability to participate safely and fully with ADLs.    Progress towards goals:    Short Term Goals:  (in  3    weeks)  1. Pt demo Independent with HEP and symptom management -PROGRESSING  2. Pt will decreased pain at worst to 5/10. - PROGRESSING (04/16/14- 10/10, 05/14/14- 7/10)      Long Term Goals:   (in   6   weeks)  1. Pt will improve NDI to <20% to decreased disability of neck. (gcode)- PROGRESSING (04/16/14- 58%, 05/14/14- 36%)  2. Pt will improve cervical extension AROM to 58 to improve safe driving.- PROGRESSING  3. Pt will improve cervical rotation AROM to 55 b/l to improve safe driving. -PROGRESSING  4. Pt will lift 30# from floor with no onset of pain.- PROGRESSING (04/16/14- unable, 05/14/14- 12# with no pain)    Plan: Cont PT POC; progress TE  slowly. Monitor symptoms.    Adjustments to POC:      Charges Minutes   Ther ex 15   Ther act    Performance Test    Gait training    Neuro Re-Ed    Manual [address]   [place]    [place]          Lear Ng, [place]  [place] Lic #[id number]  [facility]  Phone [phone]

## 2014-06-04 ENCOUNTER — Ambulatory Visit
Admission: RE | Admit: 2014-06-04 | Discharge: 2014-06-04 | Disposition: A | Discharge status: Home / Self Care | Payer: Medicare Other | Source: Ambulatory Visit | Attending: Internal Medicine | Admitting: Internal Medicine

## 2014-06-04 DIAGNOSIS — M542 Cervicalgia: Secondary | ICD-10-CM | POA: Insufficient documentation

## 2014-06-04 DIAGNOSIS — S139XXA Sprain of joints and ligaments of unspecified parts of neck, initial encounter: Secondary | ICD-10-CM | POA: Insufficient documentation

## 2014-06-04 NOTE — Discharge Summary (Signed)
[facility]   Physical Medicine and Rehabilitation  [address]  [place], [place] [address]  [phone]      PHYSICAL THERAPY DISCHARGE NOTE  Date:  2014-06-19    PATIENT:  [name]           MRN#: [id number]  DOB:  [birth year]  DIAGNOSIS:      DATES OF TREATMENT:          04/16/14 to 06/19/14     #VISITS:  17  # Cancel/No show: 0      Treatment Provided: therex, manual therapy, therapeutic activity, HEP.      Summary of Progress:  Pt with no improvement in NDI since last assessment on 05/14/14, but has overall improvement since eval, indicating pt likely has plateau in progress at this time with decreasing neck disability. Pt now able to lift 30# from floor, but cont to have mild pain in neck. Pt with improved cervical extension AROM, despite still having pain at end range, but did not attain goal. Pt with no improvement in cervical rotation AROM, still having pain with left rotation. Pt was not compliant with HEP, likely attributing to lack of progress. Pt no longer requires physical therapy at this time due to plateau in progress.    ROM  Right  AROM 6/16  Left  AROM 6/16    Extension  15*  na    Rotation  25*  25*      ROM  Right  AROM 05/14/14  Left  AROM 05/14/14    Extension  35*  na    Rotation  34  25*      ROM  Right  AROM 05/22/14  Left  AROM 05/22/14    Extension  20  na    Rotation  30  22      ROM  Right  AROM 06-19-2014  Left  AROM 06/19/14    Extension  35*  na    Rotation  26  22*        Goals:  Short Term Goals: (in 3 weeks)  1. Pt demo Independent with HEP and symptom management -NOT MET  2. Pt will decreased pain at worst to 5/10. - PARTLY MET (04/16/14- 10/10, 05/14/14- 7/10, Jun 19, 2014- 6/10)      Long Term Goals: (in 6 weeks)  1. Pt will improve NDI to <20% to decreased disability of neck. (gcode)- PARTLY MET (04/16/14- 58%, 05/14/14- 36%, 2014/06/19- 44%)  2. Pt will improve cervical extension AROM to 58 to improve safe driving.- PARTLY MET  3. Pt will improve cervical rotation AROM to 55 b/l to  improve safe driving. -NOT MET  4. Pt will lift 30# from floor with no onset of pain.- PARTLY MET (04/16/14- unable, 05/14/14- 12# with no pain, 06-19-14- 30# with pain)    G-codes  Current: CK  Goal: CI  D/C: CK  Outcome measure: NDI      Recommendations:  D/C from physical therapy HEP due to plateau in progress at this time. Pt educated to participate in consistent HEP.        [name], PT, DPT  [place] [facility] #[id number]  [name]  [phone]      2014/06/19

## 2014-06-04 NOTE — PT Plan of Care Note (Signed)
[facility]  Physical Therapy Treatment Note    Patient:   [name]                                                        MRN#: [id number]  Date: 06/04/2014  # of Visits: 17    Time of treatment:   Start Time:     1048     Stop Time:   1126    Subjective:   6/10 pain at worst. "I still have pain when I turn my head to the left and when I look up. I haven't been doing my HEP. I only do the exercises here (at therapy). I will do them at home".       Objective/Measurements:     05/21/14 7/22 7/23 05/27/14 05/29/14 06/04/14   UBE  UBE 3/3 mins fwd and bwd UBE 3/3 mins fwd and bwd UBE 3/3 mins fwd and bwd sci fit UE only lvl 1.5 3/3 Fwd Bwd instructed for upright posture sci fit UE only lvl 1.5 3/3 Fwd Bwd instructed for upright posture sci fit UE only lvl 1.5 3/3 Fwd Bwd instructed for upright posture   Upper cervical flexion "chin tucks' Standing x 10 reps  with towel behind head, cues for cervical retraction and flexion  Seated 5" hold x 10,cues to prevent hyperextension with relaxation    Standing holding cane behind spine, cues for retraction and not flexion/extension x 10    Standing hips flexed with UEs on  plinth therapist holding cane along spine and pt holding ball under chin, cues for retraction until head meets cane and not flexion/extension x 10   Standing holding cane behind spine for posture and in front of mirror, TCs cues for retraction  5" x 10      Deep cervical stabilization- lateral flexion n with chin tuck- "3 finger between chin and chest"   Standing holding cane behind back for posture   x 10 reps B Standing holding cane behind spine for posture and in front of mirror, TCs cues for retraction  5" x 10      Deep cervical stabilization- rotation with chin tuck- "3 finger between chin and chest"  Standing  x 10 reps  with towel behind head, cues for cervical retraction and flexion Seated x 10 reps B with visualization, fist for proper head placement Standing holding cane behind spine for  posture and in front of mirror, TCs cues for retraction  5" x 10      UT stretch   1 x 15" each seated      Passive spine and pect stretch    Supine in bed 2 half foam roll longwise lay on towel for 2 mins     Prone scap retraction   Standing against wall, cues for head posture with cervical retraction, 5" hold x 10 Standing against wall, cues for head posture with cervical retraction, YTB for [facility] ER, 5" hold x 10 Standing against wall, cues for head posture with cervical retraction, RTB rows, 90/90 rows,  Standing against wall, cues for head posture with cervical retraction, RTB B UE bent elbow rows 5" hold x 10    Shoulder 90/90 retraction    Against wall AROM retraction to wall and end with ER of shoulder RTB B UE x  10 reps standing against wall for 90/90     Standing B UE retraction to wall and end with ER of shoulder RTB x 10reps with constant cuing for form    Scalene stretch   1 x 15" each Seated      "W" into back pocket   5" x 10 focusing on cervical posture  5" x 10 focusing on cervical posture against wall, provided TCs to find posture    Standing flexion against wall w/ cervical head neutral     3# cane 10x overhead flexion 25% cues    Chest press 3# 10x      Wall angels    Attempted w/ theraband unable to tolerate.   AROM 3x pain L/UT     Seated theraball interventions    Bicep curls 10x 2# BUE focus on cervical and thoracic posture    90/90 rows w/ 2# BUE 10x    Alter UE flexion 2# 10x each        ROM  Right  AROM 6/16 Left  AROM 6/16   Extension  15*  na    Rotation  25*  25*        ROM  Right  AROM 05/14/14 Left  AROM 05/14/14   Extension  35* na   Rotation  34 25*       ROM  Right  AROM 05/22/14 Left  AROM 05/22/14   Extension  20 na   Rotation  30 22       ROM  Right  AROM 06/04/14 Left  AROM 06/04/14   Extension  35* na   Rotation  26 22*           Functional Activities: Pt lifted 22#, 30# from floor with mild onset of pain. Pt lifted 12# with no onset of pain.    Manual Therapy:   Suboccipital release in  supine. Cervical PROM- upper cervical flexion, rotation, lateral flexion. Cervical AO mobs, grade III. STM to cervical paraspinals, focusing on C1-4.      Patient Education: Pt educated on progress with POC. Pt educated on importance of continuing participation with HEP beyond discharge to maximize benefit from therapy.        Current HEP Dosage 05/06/14   Levator stretch 2 reps B BID 10-30" as tolerated Cont   UT stretch 2 reps B BID 10-30" as tolerated Cont   Chin tucks/retraction 10 x 5-10" every other day Cont   Chin tuck with rotation 10 B x 5-10" every other day Cont   rows YTB 2x10 every other day Cont   Cervical rotation ROM w/ visualization  3 reps pure visualization, f/b 10 reps visualization with motion on both sides, BID     Other:   NDI: 44%    Assessment: Pt with no improvement in NDI since last assessment on 05/14/14, but has overall improvement since eval, indicating pt likely has plateau in progress at this time with decreasing neck disability. Pt now able to lift 30# from floor, but cont to have mild pain in neck. Pt with improved cervical extension AROM, despite still having pain at end range, but did not attain goal. Pt with no improvement in cervical rotation AROM, still having pain with left rotation. Pt was not compliant with HEP, likely attributing to lack of progress. Pt no longer requires physical therapy at this time due to plateau in progress.    Progress towards goals:    Short Term Goals:  (in  3  weeks)  1. Pt demo Independent with HEP and symptom management -NOT MET  2. Pt will decreased pain at worst to 5/10. - PARTLY MET (04/16/14- 10/10, 05/14/14- 7/10, 06/04/14- 6/10)      Long Term Goals:   (in   6   weeks)  1. Pt will improve NDI to <20% to decreased disability of neck. (gcode)- PARTLY MET (04/16/14- 58%, 05/14/14- 36%, 06/04/14- 44%)  2. Pt will improve cervical extension AROM to 58 to improve safe driving.- PARTLY MET  3. Pt will improve cervical rotation AROM to 55 b/l to improve  safe driving. -NOT MET  4. Pt will lift 30# from floor with no onset of pain.- PARTLY MET (04/16/14- unable, 05/14/14- 12# with no pain, 06/04/14- 30# with pain)    Plan: D/C from physical therapy HEP due to plateau in progress at this time. Pt educated to participate in consistent HEP.      Charges Minutes   Ther ex    Ther act 15   Performance Test    Gait training    Neuro Re-Ed    Manual [address]    [place]        [name], PT, DPT  [place] [facility] #[id number]  [phone]

## 2014-08-13 ENCOUNTER — Ambulatory Visit
Admission: RE | Admit: 2014-08-13 | Discharge: 2014-08-13 | Disposition: A | Discharge status: Home / Self Care | Payer: Medicare Other | Source: Ambulatory Visit | Attending: Internal Medicine | Admitting: Internal Medicine

## 2014-08-13 DIAGNOSIS — E559 Vitamin D deficiency, unspecified: Secondary | ICD-10-CM | POA: Insufficient documentation

## 2014-08-13 DIAGNOSIS — M2550 Pain in unspecified joint: Secondary | ICD-10-CM | POA: Insufficient documentation

## 2014-08-13 DIAGNOSIS — R109 Unspecified abdominal pain: Secondary | ICD-10-CM | POA: Insufficient documentation

## 2014-08-13 DIAGNOSIS — E039 Hypothyroidism, unspecified: Secondary | ICD-10-CM | POA: Insufficient documentation

## 2014-08-13 DIAGNOSIS — M810 Age-related osteoporosis without current pathological fracture: Secondary | ICD-10-CM | POA: Insufficient documentation

## 2014-08-13 DIAGNOSIS — M129 Arthropathy, unspecified: Secondary | ICD-10-CM | POA: Insufficient documentation

## 2014-08-13 DIAGNOSIS — E785 Hyperlipidemia, unspecified: Secondary | ICD-10-CM | POA: Insufficient documentation

## 2014-08-13 DIAGNOSIS — I10 Essential (primary) hypertension: Secondary | ICD-10-CM | POA: Insufficient documentation

## 2014-08-13 DIAGNOSIS — E119 Type 2 diabetes mellitus without complications: Secondary | ICD-10-CM | POA: Insufficient documentation

## 2014-08-13 DIAGNOSIS — R350 Frequency of micturition: Secondary | ICD-10-CM | POA: Insufficient documentation

## 2014-08-13 DIAGNOSIS — R5381 Other malaise: Secondary | ICD-10-CM | POA: Insufficient documentation

## 2014-08-13 DIAGNOSIS — D518 Other vitamin B12 deficiency anemias: Secondary | ICD-10-CM | POA: Insufficient documentation

## 2014-08-13 LAB — CBC AND DIFFERENTIAL
Basophils Absolute Automated: 0.02 10*3/uL (ref 0.00–0.20)
Basophils Automated: 0 %
Eosinophils Absolute Automated: 0.18 10*3/uL (ref 0.00–0.70)
Eosinophils Automated: 4 %
Hematocrit: 43.3 % (ref 42.0–52.0)
Hgb: 14.5 g/dL (ref 13.0–17.0)
Immature Granulocytes Absolute: 0.01 10*3/uL
Immature Granulocytes: 0 %
Lymphocytes Absolute Automated: 1.69 10*3/uL (ref 0.50–4.40)
Lymphocytes Automated: 33 %
MCH: 33 pg — ABNORMAL HIGH (ref 28.0–32.0)
MCHC: 33.5 g/dL (ref 32.0–36.0)
MCV: 98.4 fL (ref 80.0–100.0)
MPV: 10.4 fL (ref 9.4–12.3)
Monocytes Absolute Automated: 0.31 10*3/uL (ref 0.00–1.20)
Monocytes: 6 %
Neutrophils Absolute: 2.91 10*3/uL (ref 1.80–8.10)
Neutrophils: 57 %
Nucleated RBC: 0 /100 WBC (ref 0–1)
Platelets: 251 10*3/uL (ref 140–400)
RBC: 4.4 10*6/uL — ABNORMAL LOW (ref 4.70–6.00)
RDW: 14 % (ref 12–15)
WBC: 5.11 10*3/uL (ref 3.50–10.80)

## 2014-08-13 LAB — COMPREHENSIVE METABOLIC PANEL
ALT: 11 U/L (ref 0–55)
AST (SGOT): 15 U/L (ref 5–34)
Albumin/Globulin Ratio: 1 (ref 0.9–2.2)
Albumin: 3.5 g/dL (ref 3.5–5.0)
Alkaline Phosphatase: 75 U/L (ref 38–106)
BUN: 11 mg/dL (ref 9.0–28.0)
Bilirubin, Total: 0.6 mg/dL (ref 0.1–1.2)
CO2: 27 mEq/L (ref 21–30)
Calcium: 9.2 mg/dL (ref 7.9–10.2)
Chloride: 107 mEq/L (ref 100–111)
Creatinine: 1.1 mg/dL (ref 0.5–1.5)
Globulin: 3.4 g/dL (ref 2.0–3.7)
Glucose: 132 mg/dL — ABNORMAL HIGH (ref 70–100)
Potassium: 5 mEq/L (ref 3.5–5.3)
Protein, Total: 6.9 g/dL (ref 6.0–8.3)
Sodium: 140 mEq/L (ref 135–146)

## 2014-08-13 LAB — URINALYSIS, REFLEX TO MICROSCOPIC EXAM IF INDICATED
Bilirubin, UA: NEGATIVE
Blood, UA: NEGATIVE
Glucose, UA: NEGATIVE
Ketones UA: NEGATIVE
Leukocyte Esterase, UA: NEGATIVE
Nitrite, UA: NEGATIVE
Protein, UR: NEGATIVE
Specific Gravity UA: 1.019 (ref 1.001–1.035)
Urine pH: 6 (ref 5.0–8.0)
Urobilinogen, UA: 1 (ref 0.2–2.0)

## 2014-08-13 LAB — VITAMIN B12: Vitamin B-12: 391 pg/mL (ref 211–911)

## 2014-08-13 LAB — URIC ACID: Uric acid: 5.5 mg/dL (ref 3.6–9.7)

## 2014-08-13 LAB — HEMOLYSIS INDEX: Hemolysis Index: 24 — ABNORMAL HIGH (ref 0–18)

## 2014-08-13 LAB — LIPID PANEL
Cholesterol / HDL Ratio: 4.4
Cholesterol: 183 mg/dL (ref 0–199)
HDL: 42 mg/dL (ref >40)
LDL Calculated: 122 mg/dL — ABNORMAL HIGH (ref 0–99)
Triglycerides: 94 mg/dL (ref 34–149)
VLDL Calculated: 19 mg/dL (ref 10–40)

## 2014-08-13 LAB — GFR: EGFR: >60

## 2014-08-13 LAB — VITAMIN D,25 OH,TOTAL: Vitamin D, 25 OH, Total: 20 ng/mL — ABNORMAL LOW (ref 30–100)

## 2014-08-13 LAB — TSH: TSH: 1.28 u[IU]/mL (ref 0.35–4.94)

## 2014-08-13 LAB — HEMOGLOBIN A1C: Hemoglobin A1C: 6.4 % — ABNORMAL HIGH (ref 0.0–6.0)

## 2014-11-11 ENCOUNTER — Ambulatory Visit
Admission: RE | Admit: 2014-11-11 | Discharge: 2014-11-11 | Disposition: A | Discharge status: Home / Self Care | Payer: Medicare Other | Source: Ambulatory Visit | Attending: Internal Medicine | Admitting: Internal Medicine

## 2014-11-11 DIAGNOSIS — M2550 Pain in unspecified joint: Secondary | ICD-10-CM | POA: Insufficient documentation

## 2014-11-11 DIAGNOSIS — R7301 Impaired fasting glucose: Secondary | ICD-10-CM | POA: Insufficient documentation

## 2014-11-11 DIAGNOSIS — M810 Age-related osteoporosis without current pathological fracture: Secondary | ICD-10-CM | POA: Insufficient documentation

## 2014-11-11 DIAGNOSIS — E755 Other lipid storage disorders: Secondary | ICD-10-CM | POA: Insufficient documentation

## 2014-11-11 DIAGNOSIS — R5383 Other fatigue: Secondary | ICD-10-CM | POA: Insufficient documentation

## 2014-11-11 DIAGNOSIS — M129 Arthropathy, unspecified: Secondary | ICD-10-CM | POA: Insufficient documentation

## 2014-11-11 DIAGNOSIS — R109 Unspecified abdominal pain: Secondary | ICD-10-CM | POA: Insufficient documentation

## 2014-11-11 DIAGNOSIS — M419 Scoliosis, unspecified: Secondary | ICD-10-CM | POA: Insufficient documentation

## 2014-11-11 DIAGNOSIS — E039 Hypothyroidism, unspecified: Secondary | ICD-10-CM

## 2014-11-11 LAB — CBC AND DIFFERENTIAL
Basophils Absolute Automated: 0.03 10*3/uL (ref 0.00–0.20)
Basophils Automated: 0 %
Eosinophils Absolute Automated: 0.12 10*3/uL (ref 0.00–0.70)
Eosinophils Automated: 2 %
Hematocrit: 42.8 % (ref 42.0–52.0)
Hgb: 13.6 g/dL (ref 13.0–17.0)
Immature Granulocytes Absolute: 0.01 10*3/uL
Immature Granulocytes: 0 %
Lymphocytes Absolute Automated: 1.34 10*3/uL (ref 0.50–4.40)
Lymphocytes Automated: 24 %
MCH: 32.2 pg — ABNORMAL HIGH (ref 28.0–32.0)
MCHC: 31.8 g/dL — ABNORMAL LOW (ref 32.0–36.0)
MCV: 101.4 fL — ABNORMAL HIGH (ref 80.0–100.0)
MPV: 10.5 fL (ref 9.4–12.3)
Monocytes Absolute Automated: 0.42 10*3/uL (ref 0.00–1.20)
Monocytes: 8 %
Neutrophils Absolute: 3.71 10*3/uL (ref 1.80–8.10)
Neutrophils: 66 %
Nucleated RBC: 0 /100 WBC (ref 0–1)
Platelets: 229 10*3/uL (ref 140–400)
RBC: 4.22 10*6/uL — ABNORMAL LOW (ref 4.70–6.00)
RDW: 14 % (ref 12–15)
WBC: 5.63 10*3/uL (ref 3.50–10.80)

## 2014-11-11 LAB — COMPREHENSIVE METABOLIC PANEL
ALT: 11 U/L (ref 0–55)
AST (SGOT): 15 U/L (ref 5–34)
Albumin/Globulin Ratio: 1.1 (ref 0.9–2.2)
Albumin: 3.4 g/dL — ABNORMAL LOW (ref 3.5–5.0)
Alkaline Phosphatase: 75 U/L (ref 38–106)
BUN: 14 mg/dL (ref 9.0–28.0)
Bilirubin, Total: 0.5 mg/dL (ref 0.1–1.2)
CO2: 27 mEq/L (ref 21–30)
Calcium: 9 mg/dL (ref 7.9–10.2)
Chloride: 107 mEq/L (ref 100–111)
Creatinine: 1 mg/dL (ref 0.5–1.5)
Globulin: 3.1 g/dL (ref 2.0–3.7)
Glucose: 147 mg/dL — ABNORMAL HIGH (ref 70–100)
Potassium: 4.5 mEq/L (ref 3.5–5.3)
Protein, Total: 6.5 g/dL (ref 6.0–8.3)
Sodium: 141 mEq/L (ref 135–146)

## 2014-11-11 LAB — TSH: TSH: 1.16 u[IU]/mL (ref 0.35–4.94)

## 2014-11-11 LAB — VITAMIN B12: Vitamin B-12: 276 pg/mL (ref 211–911)

## 2014-11-11 LAB — LIPID PANEL
Cholesterol / HDL Ratio: 4
Cholesterol: 175 mg/dL (ref 0–199)
HDL: 44 mg/dL (ref >40)
LDL Calculated: 114 mg/dL — ABNORMAL HIGH (ref 0–99)
Triglycerides: 85 mg/dL (ref 34–149)
VLDL Calculated: 17 mg/dL (ref 10–40)

## 2014-11-11 LAB — GFR: EGFR: >60

## 2014-11-11 LAB — HEMOLYSIS INDEX: Hemolysis Index: 1 (ref 0–18)

## 2014-11-11 LAB — URIC ACID: Uric acid: 4.9 mg/dL (ref 3.6–9.7)

## 2014-11-11 LAB — HEMOGLOBIN A1C: Hemoglobin A1C: 6.3 % — ABNORMAL HIGH (ref 0.0–6.0)

## 2014-11-12 LAB — URINALYSIS, REFLEX TO MICROSCOPIC EXAM IF INDICATED
Bilirubin, UA: NEGATIVE
Blood, UA: NEGATIVE
Glucose, UA: NEGATIVE
Ketones UA: NEGATIVE
Leukocyte Esterase, UA: NEGATIVE
Nitrite, UA: NEGATIVE
Protein, UR: 30 — AB
Specific Gravity UA: 1.03 (ref 1.001–1.035)
Urine pH: 6.5 (ref 5.0–8.0)
Urobilinogen, UA: 0.2 (ref 0.2–2.0)

## 2015-03-05 ENCOUNTER — Emergency Department: Payer: Medicare Other

## 2015-03-05 ENCOUNTER — Emergency Department
Admission: EM | Admit: 2015-03-05 | Discharge: 2015-03-05 | Disposition: A | Discharge status: Home / Self Care | Payer: Medicare Other | Attending: Emergency Medicine | Admitting: Emergency Medicine

## 2015-03-05 DIAGNOSIS — K5732 Diverticulitis of large intestine without perforation or abscess without bleeding: Secondary | ICD-10-CM | POA: Insufficient documentation

## 2015-03-05 DIAGNOSIS — E119 Type 2 diabetes mellitus without complications: Secondary | ICD-10-CM | POA: Insufficient documentation

## 2015-03-05 DIAGNOSIS — F17210 Nicotine dependence, cigarettes, uncomplicated: Secondary | ICD-10-CM | POA: Insufficient documentation

## 2015-03-05 LAB — COMPREHENSIVE METABOLIC PANEL
ALT: 10 U/L (ref 0–55)
AST (SGOT): 16 U/L (ref 5–34)
Albumin/Globulin Ratio: 0.9 (ref 0.9–2.2)
Albumin: 3.5 g/dL (ref 3.5–5.0)
Alkaline Phosphatase: 78 U/L (ref 38–106)
Anion Gap: 7 (ref 5.0–15.0)
BUN: 19 mg/dL (ref 9.0–28.0)
Bilirubin, Total: 0.5 mg/dL (ref 0.2–1.2)
CO2: 28 mEq/L (ref 22–29)
Calcium: 9.2 mg/dL (ref 7.9–10.2)
Chloride: 105 mEq/L (ref 100–111)
Creatinine: 1.2 mg/dL (ref 0.7–1.3)
Globulin: 3.8 g/dL — ABNORMAL HIGH (ref 2.0–3.6)
Glucose: 137 mg/dL — ABNORMAL HIGH (ref 70–100)
Potassium: 4.2 mEq/L (ref 3.5–5.1)
Protein, Total: 7.3 g/dL (ref 6.0–8.3)
Sodium: 140 mEq/L (ref 136–145)

## 2015-03-05 LAB — LIPASE: Lipase: 87 U/L — ABNORMAL HIGH (ref 8–78)

## 2015-03-05 LAB — CBC AND DIFFERENTIAL
Basophils Absolute Automated: 0.03 10*3/uL (ref 0.00–0.20)
Basophils Automated: 0 %
Eosinophils Absolute Automated: 0.33 10*3/uL (ref 0.00–0.70)
Eosinophils Automated: 5 %
Hematocrit: 41.4 % — ABNORMAL LOW (ref 42.0–52.0)
Hgb: 13.6 g/dL (ref 13.0–17.0)
Immature Granulocytes Absolute: 0.01 10*3/uL
Immature Granulocytes: 0 %
Lymphocytes Absolute Automated: 1.52 10*3/uL (ref 0.50–4.40)
Lymphocytes Automated: 22 %
MCH: 32.2 pg — ABNORMAL HIGH (ref 28.0–32.0)
MCHC: 32.9 g/dL (ref 32.0–36.0)
MCV: 98.1 fL (ref 80.0–100.0)
MPV: 10.4 fL (ref 9.4–12.3)
Monocytes Absolute Automated: 0.42 10*3/uL (ref 0.00–1.20)
Monocytes: 6 %
Neutrophils Absolute: 4.63 10*3/uL (ref 1.80–8.10)
Neutrophils: 67 %
Nucleated RBC: 0 /100 WBC (ref 0–1)
Platelets: 232 10*3/uL (ref 140–400)
RBC: 4.22 10*6/uL — ABNORMAL LOW (ref 4.70–6.00)
RDW: 14 % (ref 12–15)
WBC: 6.93 10*3/uL (ref 3.50–10.80)

## 2015-03-05 LAB — URINALYSIS, REFLEX TO MICROSCOPIC EXAM IF INDICATED
Bilirubin, UA: NEGATIVE
Blood, UA: NEGATIVE
Glucose, UA: 50 — AB
Ketones UA: NEGATIVE
Leukocyte Esterase, UA: NEGATIVE
Nitrite, UA: NEGATIVE
Protein, UR: 30 — AB
Specific Gravity UA: 1.027 (ref 1.001–1.035)
Urine Bacteria: 11 /hpf
Urine pH: 5 (ref 5.0–8.0)
Urobilinogen, UA: 2 mg/dL (ref 0.2–2.0)

## 2015-03-05 LAB — HEMOLYSIS INDEX: Hemolysis Index: 8 (ref 0–18)

## 2015-03-05 LAB — GFR: EGFR: >60

## 2015-03-05 LAB — LACTIC ACID, PLASMA: Lactic Acid: 1.5 mmol/L (ref 0.2–2.0)

## 2015-03-05 MED ORDER — ONDANSETRON HCL 4 MG/2ML IJ SOLN
4.0000 mg | Freq: Once | INTRAMUSCULAR | Status: AC
Start: 2015-03-05 — End: 2015-03-05
  Administered 2015-03-05: 4 mg via INTRAVENOUS
  Filled 2015-03-05: qty 2

## 2015-03-05 MED ORDER — MORPHINE SULFATE 2 MG/ML IJ/IV SOLN (WRAP)
2.0000 mg | Freq: Once | Status: AC
Start: 2015-03-05 — End: 2015-03-05
  Administered 2015-03-05: 2 mg via INTRAVENOUS
  Filled 2015-03-05: qty 1

## 2015-03-05 MED ORDER — CIPROFLOXACIN HCL 500 MG PO TABS
500.0000 mg | ORAL_TABLET | Freq: Once | ORAL | Status: AC
Start: 2015-03-05 — End: 2015-03-05
  Administered 2015-03-05: 500 mg via ORAL
  Filled 2015-03-05: qty 1

## 2015-03-05 MED ORDER — IOHEXOL 350 MG/ML IV SOLN
100.0000 mL | Freq: Once | INTRAVENOUS | Status: AC | PRN
Start: 2015-03-05 — End: 2015-03-05
  Administered 2015-03-05: 100 mL via INTRAVENOUS

## 2015-03-05 MED ORDER — SODIUM CHLORIDE 0.9 % IV BOLUS
500.0000 mL | Freq: Once | INTRAVENOUS | Status: AC
Start: 2015-03-05 — End: 2015-03-05
  Administered 2015-03-05: 500 mL via INTRAVENOUS

## 2015-03-05 MED ORDER — CIPROFLOXACIN HCL 500 MG PO TABS
500.0000 mg | ORAL_TABLET | Freq: Two times a day (BID) | ORAL | Status: AC
Start: 2015-03-05 — End: 2015-03-12

## 2015-03-05 MED ORDER — FAMOTIDINE 10 MG/ML IV SOLN (WRAP)
20.0000 mg | Freq: Once | INTRAVENOUS | Status: AC
Start: 2015-03-05 — End: 2015-03-05
  Administered 2015-03-05: 20 mg via INTRAVENOUS
  Filled 2015-03-05: qty 2

## 2015-03-05 MED ORDER — ACETAMINOPHEN-CODEINE 300-30 MG PO TABS
1.0000 | ORAL_TABLET | Freq: Four times a day (QID) | ORAL | Status: DC | PRN
Start: 2015-03-05 — End: 2015-03-21

## 2015-03-05 MED ORDER — METRONIDAZOLE 500 MG PO TABS
500.0000 mg | ORAL_TABLET | Freq: Three times a day (TID) | ORAL | Status: AC
Start: 2015-03-05 — End: 2015-03-12

## 2015-03-05 MED ORDER — METRONIDAZOLE 250 MG PO TABS
500.0000 mg | ORAL_TABLET | Freq: Once | ORAL | Status: AC
Start: 2015-03-05 — End: 2015-03-05
  Administered 2015-03-05: 500 mg via ORAL
  Filled 2015-03-05: qty 2

## 2015-03-05 NOTE — ED Notes (Signed)
Genera;ized abd pain for the past week, worse on the right side, nausea, no vomiting or diarrhea

## 2015-03-05 NOTE — ED Notes (Signed)
Discharge instructions per md

## 2015-03-05 NOTE — ED Provider Notes (Signed)
EMERGENCY DEPARTMENT HISTORY AND PHYSICAL EXAM     Physician/Midlevel provider first contact with patient: 03/05/15 0836         Date: 03/05/2015  Patient Name: [name]    History of Presenting Illness     Chief Complaint   Patient presents with   . Abdominal Pain       History Provided By: patient    Chief Complaint: abd pain  Onset: 1 wk  Timing: constant   Location: left side of abd  Quality: "itchy"  Severity: mild to moderate   Modifying Factors: no home tx sought  Associated Symptoms: vomiting x 1, "itching"    Additional History: [name] is a 81 y.o. male with h/o rx controlled DM. He presents with abd pain located on the left side x 1 wk. He also c/o vomiting, itching pain. Denies h/o similar sxs, R sided abd pain, constipation, blood in stool or dysuria. He does not take Tylenol or Motrin daily. Pt states his last BM was "green." Pt states he ate seafood last week and suspects that may have caused his pain.     PCP: [name], MD      No current facility-administered medications for this encounter.     Current Outpatient Prescriptions   Medication Sig Dispense Refill   . Acetaminophen-Codeine (TYLENOL/CODEINE #3) 300-30 MG per tablet Take 1-2 tablets by mouth every 6 (six) hours as needed for Pain (pain). 16 tablet 0   . ciprofloxacin (CIPRO) 500 MG tablet Take 1 tablet (500 mg total) by mouth 2 (two) times daily. 14 tablet 0   . metFORMIN (GLUCOPHAGE) 850 MG tablet Take 850 mg by mouth 2 (two) times daily with meals.     . metroNIDAZOLE (FLAGYL) 500 MG tablet Take 1 tablet (500 mg total) by mouth 3 (three) times daily. 21 tablet 0       Past History     Past Medical History:  Past Medical History   Diagnosis Date   . No diagnosis    . Diabetes mellitus        Past Surgical History:  Past Surgical History   Procedure Laterality Date   . Appendectomy         Family History:  No family history on file.    Social History:  History   Substance Use Topics   . Smoking status: Current Some Day  Smoker     Types: Cigarettes   . Smokeless tobacco: Not on file   . Alcohol Use: Yes       Allergies:  No Known Allergies    Review of Systems     Review of Systems   Constitutional: Negative for diaphoresis.   HENT: Negative for nosebleeds.    Eyes: Negative for photophobia.   Respiratory: Negative for chest tightness and shortness of breath.    Gastrointestinal: Positive for vomiting and abdominal pain. Negative for constipation and blood in stool.        (+) green stool  Denies pain in R side of abd   Genitourinary: Negative for dysuria.   Musculoskeletal: Negative for neck stiffness.   Skin: Negative for rash.   Allergic/Immunologic:        NKDA   Psychiatric/Behavioral: Negative for suicidal ideas.       Physical Exam   BP 138/65 mmHg  Pulse 72  Temp(Src) 95.5 F (35.3 C) (Temporal Artery)  Resp 16  Ht 5\' 7"  (1.702 m)  Wt 68.493 kg  BMI 23.64 kg/m2  SpO2 100%    Physical Exam   Constitutional: He is oriented to person, place, and time and well-developed, well-nourished, and in no distress.   HENT:   Head: Normocephalic and atraumatic.   Nose: Nose normal.   Mouth/Throat: Oropharynx is clear and moist.   Eyes: Conjunctivae are normal. Right eye exhibits no discharge. Left eye exhibits no discharge.   Neck: Normal range of motion. Neck supple.   Cardiovascular: Normal rate, regular rhythm, normal heart sounds and intact distal pulses.  Exam reveals no gallop and no friction rub.    No murmur heard.  Pulmonary/Chest: Effort normal and breath sounds normal. No respiratory distress. He has no wheezes. He has no rales. He exhibits no tenderness.   Abdominal: Soft. Bowel sounds are normal. He exhibits no distension and no mass. There is tenderness (mild, LUQ and epigastric). There is no rebound and no guarding.   No RUQ pain. No RLQ pain.   Musculoskeletal: Normal range of motion. He exhibits no edema or tenderness.   Lymphadenopathy:     He has no cervical adenopathy.   Neurological: He is alert and oriented  to person, place, and time. GCS score is 15.   Skin: Skin is warm and dry. No rash noted. He is not diaphoretic. No erythema.   Psychiatric: Memory and affect normal.   Nursing note and vitals reviewed.        Diagnostic Study Results     Labs -     Results     Procedure Component Value Units Date/Time    UA, Reflex to Microscopic [[id number]]  (Abnormal) Collected:  03/05/15 0845    Specimen Information:  Urine Updated:  03/05/15 0911     Urine Type Clean Catch      Color, UA Yellow      Clarity, UA Hazy      Specific Gravity UA 1.027      Urine pH 5.0      Leukocyte Esterase, UA Negative      Nitrite, UA Negative      Protein, UR 30 (A)      Glucose, UA 50 (A)      Ketones UA Negative      Urobilinogen, UA 2.0 mg/dL      Bilirubin, UA Negative      Blood, UA Negative      RBC, UA 6 - 10 (A) /hpf      WBC, UA 0 - 5 /hpf      Squamous Epithelial Cells, Urine 0 - 5 /hpf      Urine Bacteria 11 /hpf      Hyaline Casts, UA 26 - 50 (A) /lpf      Urine Mucus Present     Lipase [[id number]]  (Abnormal) Collected:  03/05/15 0844    Specimen Information:  Blood Updated:  03/05/15 0908     Lipase 87 (H) U/L     Hemolysis index [[id number]] Collected:  03/05/15 0844     Hemolysis Index 8 Updated:  03/05/15 0908    GFR [[id number]] Collected:  03/05/15 0844     EGFR >60.0 Updated:  03/05/15 0908    Comprehensive metabolic panel [[id number]]  (Abnormal) Collected:  03/05/15 0844    Specimen Information:  Blood Updated:  03/05/15 0908     Glucose 137 (H) mg/dL      BUN 24.4 mg/dL      Creatinine 1.2 mg/dL      Sodium 010 mEq/L      Potassium 4.2  mEq/L      Chloride 105 mEq/L      CO2 28 mEq/L      CALCIUM 9.2 mg/dL      Protein, Total 7.3 g/dL      Albumin 3.5 g/dL      AST (SGOT) 16 U/L      ALT 10 U/L      Alkaline Phosphatase 78 U/L      Bilirubin, Total 0.5 mg/dL      Globulin 3.8 (H) g/dL      Albumin/Globulin Ratio 0.9      Anion Gap 7.0     Lactic acid [[id number]] Collected:  03/05/15 0844    Specimen Information:  Blood  Updated:  03/05/15 0857     Lactic acid 1.5 mmol/L     CBC with differential [[id number]]  (Abnormal) Collected:  03/05/15 0844    Specimen Information:  Blood / Blood Updated:  03/05/15 0855     WBC 6.93 x10 3/uL      Hgb 13.6 g/dL      Hematocrit 78.4 (L) %      Platelets 232 x10 3/uL      RBC 4.22 (L) x10 6/uL      MCV 98.1 fL      MCH 32.2 (H) pg      MCHC 32.9 g/dL      RDW 14 %      MPV 10.4 fL      Neutrophils 67 %      Lymphocytes Automated 22 %      Monocytes 6 %      Eosinophils Automated 5 %      Basophils Automated 0 %      Immature Granulocyte 0 %      Nucleated RBC 0 /100 WBC      Neutrophils Absolute 4.63 x10 3/uL      Abs Lymph Automated 1.52 x10 3/uL      Abs Mono Automated 0.42 x10 3/uL      Abs Eos Automated 0.33 x10 3/uL      Absolute Baso Automated 0.03 x10 3/uL      Absolute Immature Granulocyte 0.01 x10 3/uL           Radiologic Studies -   Radiology Results (24 Hour)     Procedure Component Value Units Date/Time    CT Abd/Pelvis with IV Contrast only [[id number]] Collected:  03/05/15 1020    Order Status:  Completed Updated:  03/05/15 1037    Narrative:      HISTORY: Left-sided abdominal pain    TECHNIQUE: Enhanced, abdominal and pelvic computed tomography was  performed at 5 mm thickness.    . A combination of automatic exposure  control, adjustment of the mA a and/or KVP according to the patient's  size and or use of iterative reconstruction technique was utilized.    PRIOR: 09/25/2004.    FINDINGS:   The evaluation of the solid organs is limited due to lack of intravenous  contrast.  The liver, spleen and pancreas are unremarkable.   There is no dilatation of the intra or extrahepatic bile ducts.   The gallbladder is unremarkable.   There is colonic diverticulosis. There is segmental area of thickening  and inflammation involving the mid descending colon consistent with  acute diverticulitis. The rest of the gastrointestinal tract is within  normal limits. There is no evidence of  gastrointestinal obstruction.         The kidneys are unremarkable. The adrenal glands  are normal.  The great vessels are normal in caliber. There are scattered, vascular  atherosclerotic changes. There is no evidence of ascites.  The pelvis show     no evidence of pelvic masses or collection.  The urinary bladder is unremarkable.   There are degenerative changes of the lower lumbar spine.      Impression:       Findings consistent with acute diverticulitis of the mid  descending colon.    Georgana Curio, MD   03/05/2015 10:33 AM        .      Medical Decision Making   I am the first provider for this patient.    I reviewed the vital signs, available nursing notes, past medical history, past surgical history, family history and social history.    Vital Signs-Reviewed the patient's vital signs.     Patient Vitals for the past 12 hrs:   BP Temp Pulse Resp   03/05/15 1013 138/65 mmHg - 72 16   03/05/15 0825 122/57 mmHg (!) 95.5 F (35.3 C) 90 20       Pulse Oximetry Analysis - Normal 97% on RA    Old Medical Records: Nursing notes.     ED Course: 9:23 AM -  Discussed plan in ED including labs and CT A/P. Reviewed results of lipase.     10:46 AM -  Discussed results and dx with pt. Reviewed home tx plan.     10:53 AM - Counseled on diagnosis, f/u plans, medication use, and signs and symptoms when to return to ED.  Pt is stable and ready for discharge.         Provider Notes:   81yo male with left sided abdominal pain for a week. He is non-toxic appearing. He has diverticulitis on CT scan here. No signs of abscess or perforation. His pain was improved with medications in the ED. Will start on cipro / flagyl. He was advised to stop glimepiride until he has finished his abx course for concern of potentiation of hypoglycemia. I directed PCP follow up. Blue home. Return precautions given.      Diagnosis     Clinical Impression:   1. Diverticulitis of large intestine without perforation or abscess without bleeding         _______________________________    Attestations:  This note is prepared by Rusty Aus, acting as Scribe for Rachel Bo, MD.      Rachel Bo, MD: The scribe's documentation has been prepared under my direction and personally reviewed by me in its entirety.  I confirm that the note above accurately reflects all work, treatment, procedures, and medical decision making performed by me.      _______________________________          Louis Matte, MD  03/05/15 [phone]

## 2015-03-05 NOTE — Discharge Instructions (Signed)
Diverticulitis     You have been diagnosed with diverticulitis.     Diverticulitis is when small pouches form in the colon wall. The pouches are called diverticuli. This is a blind pouch. It sticks out of the side of the colon, much like the appendix. They can show up anywhere in the large bowel (colon). However, they are most common in the left side. These outpouchings (diverticuli) often cause no symptoms. Diverticulitis is a bacterial infection of the pouch. It is most common in people over 50 years old. Symptoms include abdominal (belly) pain, usually on the left lower side. Other symptoms are fever (temperature higher than 100.4ºF / 38ºC) and sometimes diarrhea. There may also be changes in your stool habits.     Diverticulitis is treated with antibiotics and bowel rest. We recommend you limit activity and keep a liquid diet for the next two days.     Symptoms should start to get better over the next 2-3 days. Come back right away for re-evaluation if you feel sicker at any time.     Diverticulitis is a serious disease. Follow up with your primary doctor or general surgeon in the next 2-3 days. It is important to do so even if the symptoms get better. You may need another test after you have improved. This is to make sure there is no other reason for the symptoms.     YOU SHOULD SEEK MEDICAL ATTENTION IMMEDIATELY, EITHER HERE OR AT THE NEAREST EMERGENCY DEPARTMENT, IF ANY OF THE FOLLOWING OCCURS:  · More abdominal (belly) pain.  · Continued or rising fever (temperature higher than 100.4ºF / 38ºC).  · Nausea (feeling sick to your stomach) or vomiting (throwing up)  · General weakness or lightheadedness.  · Bloody stools (bowel movements).

## 2015-03-21 ENCOUNTER — Inpatient Hospital Stay: Payer: Medicare Other

## 2015-03-21 ENCOUNTER — Emergency Department: Payer: Medicare Other

## 2015-03-21 ENCOUNTER — Inpatient Hospital Stay
Admission: EM | Admit: 2015-03-21 | Discharge: 2015-03-23 | DRG: 066 | Disposition: A | Discharge status: Home / Self Care | Payer: Medicare Other | Attending: Internal Medicine | Admitting: Internal Medicine

## 2015-03-21 ENCOUNTER — Inpatient Hospital Stay: Payer: Medicare Other | Admitting: Internal Medicine

## 2015-03-21 DIAGNOSIS — E118 Type 2 diabetes mellitus with unspecified complications: Secondary | ICD-10-CM | POA: Diagnosis present

## 2015-03-21 DIAGNOSIS — M47812 Spondylosis without myelopathy or radiculopathy, cervical region: Secondary | ICD-10-CM | POA: Diagnosis present

## 2015-03-21 DIAGNOSIS — I6381 Other cerebral infarction due to occlusion or stenosis of small artery: Secondary | ICD-10-CM | POA: Diagnosis present

## 2015-03-21 DIAGNOSIS — M2578 Osteophyte, vertebrae: Secondary | ICD-10-CM | POA: Diagnosis present

## 2015-03-21 DIAGNOSIS — S0990XA Unspecified injury of head, initial encounter: Secondary | ICD-10-CM | POA: Diagnosis present

## 2015-03-21 DIAGNOSIS — W109XXA Fall (on) (from) unspecified stairs and steps, initial encounter: Secondary | ICD-10-CM | POA: Diagnosis present

## 2015-03-21 DIAGNOSIS — Z794 Long term (current) use of insulin: Secondary | ICD-10-CM

## 2015-03-21 DIAGNOSIS — I639 Cerebral infarction, unspecified: Principal | ICD-10-CM | POA: Diagnosis present

## 2015-03-21 DIAGNOSIS — W19XXXA Unspecified fall, initial encounter: Secondary | ICD-10-CM

## 2015-03-21 DIAGNOSIS — F17210 Nicotine dependence, cigarettes, uncomplicated: Secondary | ICD-10-CM | POA: Diagnosis present

## 2015-03-21 LAB — COMPREHENSIVE METABOLIC PANEL
ALT: 13 U/L (ref 0–55)
AST (SGOT): 23 U/L (ref 5–34)
Albumin/Globulin Ratio: 0.9 (ref 0.9–2.2)
Albumin: 3.3 g/dL — ABNORMAL LOW (ref 3.5–5.0)
Alkaline Phosphatase: 64 U/L (ref 38–106)
Anion Gap: 8 (ref 5.0–15.0)
BUN: 18 mg/dL (ref 9.0–28.0)
Bilirubin, Total: 0.3 mg/dL (ref 0.2–1.2)
CO2: 24 mEq/L (ref 22–29)
Calcium: 9.1 mg/dL (ref 7.9–10.2)
Chloride: 107 mEq/L (ref 100–111)
Creatinine: 1 mg/dL (ref 0.7–1.3)
Globulin: 3.8 g/dL — ABNORMAL HIGH (ref 2.0–3.6)
Glucose: 217 mg/dL — ABNORMAL HIGH (ref 70–100)
Potassium: 5.2 mEq/L — ABNORMAL HIGH (ref 3.5–5.1)
Protein, Total: 7.1 g/dL (ref 6.0–8.3)
Sodium: 139 mEq/L (ref 136–145)

## 2015-03-21 LAB — CBC AND DIFFERENTIAL
Basophils Absolute Automated: 0.02 10*3/uL (ref 0.00–0.20)
Basophils Automated: 0 %
Eosinophils Absolute Automated: 0.07 10*3/uL (ref 0.00–0.70)
Eosinophils Automated: 1 %
Hematocrit: 39.1 % — ABNORMAL LOW (ref 42.0–52.0)
Hgb: 12.9 g/dL — ABNORMAL LOW (ref 13.0–17.0)
Immature Granulocytes Absolute: 0.01 10*3/uL
Immature Granulocytes: 0 %
Lymphocytes Absolute Automated: 1.18 10*3/uL (ref 0.50–4.40)
Lymphocytes Automated: 19 %
MCH: 31.9 pg (ref 28.0–32.0)
MCHC: 33 g/dL (ref 32.0–36.0)
MCV: 96.5 fL (ref 80.0–100.0)
MPV: 10.3 fL (ref 9.4–12.3)
Monocytes Absolute Automated: 0.32 10*3/uL (ref 0.00–1.20)
Monocytes: 5 %
Neutrophils Absolute: 4.54 10*3/uL (ref 1.80–8.10)
Neutrophils: 74 %
Nucleated RBC: 0 /100 WBC (ref 0–1)
Platelets: 231 10*3/uL (ref 140–400)
RBC: 4.05 10*6/uL — ABNORMAL LOW (ref 4.70–6.00)
RDW: 14 % (ref 12–15)
WBC: 6.13 10*3/uL (ref 3.50–10.80)

## 2015-03-21 LAB — HEMOLYSIS INDEX: Hemolysis Index: 103 — ABNORMAL HIGH (ref 0–18)

## 2015-03-21 LAB — GFR: EGFR: >60

## 2015-03-21 LAB — LIPASE: Lipase: 30 U/L (ref 8–78)

## 2015-03-21 LAB — GLUCOSE WHOLE BLOOD - POCT
Whole Blood Glucose POCT: 202 mg/dL — ABNORMAL HIGH (ref 70–100)
Whole Blood Glucose POCT: 176 mg/dL — ABNORMAL HIGH (ref 70–100)

## 2015-03-21 MED ORDER — GADOBUTROL 1 MMOL/ML IV SOLN
7.0000 mL | Freq: Once | INTRAVENOUS | Status: AC | PRN
Start: 2015-03-21 — End: 2015-03-21
  Administered 2015-03-21: 7 mmol via INTRAVENOUS

## 2015-03-21 MED ORDER — ASPIRIN 81 MG PO CHEW
324.0000 mg | CHEWABLE_TABLET | Freq: Once | ORAL | Status: AC
Start: 2015-03-21 — End: 2015-03-21
  Administered 2015-03-21: 324 mg via ORAL
  Filled 2015-03-21: qty 4

## 2015-03-21 MED ORDER — FAMOTIDINE 20 MG PO TABS
20.0000 mg | ORAL_TABLET | Freq: Two times a day (BID) | ORAL | Status: DC
Start: 2015-03-21 — End: 2015-03-23
  Administered 2015-03-21 – 2015-03-23 (×4): 20 mg via ORAL
  Filled 2015-03-21 (×5): qty 1

## 2015-03-21 MED ORDER — ASPIRIN 81 MG PO CHEW
324.0000 mg | CHEWABLE_TABLET | Freq: Every day | ORAL | Status: DC
Start: 2015-03-22 — End: 2015-03-23
  Administered 2015-03-22 – 2015-03-23 (×2): 324 mg via ORAL
  Filled 2015-03-21 (×3): qty 4

## 2015-03-21 MED ORDER — DEXTROSE 50 % IV SOLN
25.0000 mL | INTRAVENOUS | Status: DC | PRN
Start: 2015-03-21 — End: 2015-03-23

## 2015-03-21 MED ORDER — ENOXAPARIN SODIUM 40 MG/0.4ML SC SOLN
40.0000 mg | Freq: Every day | SUBCUTANEOUS | Status: DC
Start: 2015-03-22 — End: 2015-03-23
  Administered 2015-03-22 – 2015-03-23 (×2): 40 mg via SUBCUTANEOUS
  Filled 2015-03-21 (×2): qty 0.4

## 2015-03-21 MED ORDER — GLUCAGON 1 MG IJ SOLR (WRAP)
1.0000 mg | INTRAMUSCULAR | Status: DC | PRN
Start: 2015-03-21 — End: 2015-03-23

## 2015-03-21 MED ORDER — INSULIN REGULAR HUMAN 100 UNIT/ML IJ SOLN
1.0000 [IU] | Freq: Three times a day (TID) | INTRAMUSCULAR | Status: DC | PRN
Start: 2015-03-21 — End: 2015-03-23
  Administered 2015-03-22 (×2): 1 [IU] via SUBCUTANEOUS
  Administered 2015-03-23: 3 [IU] via SUBCUTANEOUS
  Filled 2015-03-21: qty 3
  Filled 2015-03-21: qty 9

## 2015-03-21 NOTE — Progress Notes (Signed)
Pt admitted to [facility]. Tele placed. Pt oriented to room. Call bell within reach.

## 2015-03-21 NOTE — ED Notes (Signed)
GLU 202

## 2015-03-21 NOTE — ED Notes (Signed)
Pt presents to ED with c/o fall last night. Pt has pain to head, chest, left lower back, left shoulder, and left knee. Pt has no memory immediately after event. Family reports that pt stated that he felt dizzy and needed to sit, then fell down 5 cement steps striking his head. Pt immediately vomited after event. Pt is alert and oriented x3 and in NAD.

## 2015-03-21 NOTE — Progress Notes (Signed)
ADMISSION HISTORY AND PHYSICAL EXAM    Date Time: 03/21/2015 7:09 PM  Patient Name: [name]  Attending Physician: Gean Quint, MD    History of Present Illness:   Netanel Yannuzzi is a 81 y.o. male who presented to the emergency room after having had a fall last night.  Patient reports having dinner last night and felt dizzy and fell down on the steps.  He is denying any loss of consciousness.  Patient refused treatment and did not come to the emergency room until this afternoon. Inthe emergency room, he was complaining of headache, neck pain, left-sided chest pain and back and shoulder pain.  He had a CAT scan of the head done in the emergency room which revealed an lacunar infarct in the basal ganglia and is being admitted for further workup and management.  He is currently is asymptomatic and  is denying any extremity weakness, dizziness or headache.    Past Medical History:     Past Medical History   Diagnosis Date   . No diagnosis    . Diabetes mellitus        Past Surgical History:     Past Surgical History   Procedure Laterality Date   . Appendectomy         Allergies:     Allergies   Allergen Reactions   . Penicillins        Medications:     Prescriptions prior to admission   Medication Sig   . metFORMIN (GLUCOPHAGE) 850 MG tablet Take 850 mg by mouth 2 (two) times daily with meals.     Prescriptions prior to admission   Medication Sig Dispense Refill Last Dose   . metFORMIN (GLUCOPHAGE) 850 MG tablet Take 850 mg by mouth 2 (two) times daily with meals.   03/20/2015 at Unknown time       Family History:   No family history on file.    Social History:     History     Social History   . Marital Status: Single     Spouse Name: N/A   . Number of Children: N/A   . Years of Education: N/A     Social History Main Topics   . Smoking status: Current Some Day Smoker     Types: Cigarettes   . Smokeless tobacco: Not on file   . Alcohol Use: Yes   . Drug Use: No   . Sexual Activity: Not on file     Other Topics  Concern   . Not on file     Social History Narrative       Review of Systems:     General : negative for - chills, fatigue, fever, malaise  ENT : negative for - headaches, nasal congestion, nasal discharge  Respiratory : negative for - cough,  orthopnea, pleuritic pain, shortness of breath, or wheezing  Cardiovascular: positive for - chest pain,no dyspnea on exertion, edema, irregular heartbeat,  orthopnea, palpitations,   Gastrointestinal ROS: negative for - abdominal pain,blood in stools, constipation, diarrhea,  , hematemesis, melena, nausea/vomiting,   Genito-Urinary ROS: negative for -  dysuria,  hematuria, incontinence, nocturia or urinary frequency/urgency  Musculoskeletal ROS: Positive for - joint pain,muscle pain   Neurological ROS: negative for -  confusion, dizziness, gait disturbance, headaches,      Physical Exam:     Filed Vitals:    03/21/15 1719   BP: 164/73   Pulse: 75   Temp:    Resp: 16  SpO2: 100%       General appearance - oriented to person, place, and time, in no  distress  Mental status - normal   Eyes - pupils equal and reactive, sclera no icterus noted  Mouth - mucous membranes dry, pharynx normal without lesions  Neck - supple, no significant adenopathy, ,   thyroid is normal in size without nodules or tenderness  Chest - clear to auscultation, no wheezes, rales or rhonchi,  Heart - normal rate, regular rhythm, normal S1, S2, no murmurs,  or gallops  Abdomen - soft, nontender, nondistended, no masses or organomegaly  Neurological - alert, oriented, normal speech, no focal findings noted  Skin -  no rashes    Intake and Output Summary (Last 24 hours) at Date Time  No intake or output data in the 24 hours ending 03/21/15 1909    Labs:     Results     Procedure Component Value Units Date/Time    Comprehensive metabolic panel [[id number]]  (Abnormal) Collected:  03/21/15 1421    Specimen Information:  Blood Updated:  03/21/15 1449     Glucose 217 (H) mg/dL      BUN 40.9 mg/dL      Creatinine  1.0 mg/dL      Sodium 811 mEq/L      Potassium 5.2 (H) mEq/L      Chloride 107 mEq/L      CO2 24 mEq/L      CALCIUM 9.1 mg/dL      Protein, Total 7.1 g/dL      Albumin 3.3 (L) g/dL      AST (SGOT) 23 U/L      ALT 13 U/L      Alkaline Phosphatase 64 U/L      Bilirubin, Total 0.3 mg/dL      Globulin 3.8 (H) g/dL      Albumin/Globulin Ratio 0.9      Anion Gap 8.0     Lipase [[id number]] Collected:  03/21/15 1421    Specimen Information:  Blood Updated:  03/21/15 1449     Lipase 30 U/L     Hemolysis index [[id number]]  (Abnormal) Collected:  03/21/15 1421     Hemolysis Index 103 (H) Updated:  03/21/15 1449    GFR [[id number]] Collected:  03/21/15 1421     EGFR >60.0 Updated:  03/21/15 1449    CBC with differential [[id number]]  (Abnormal) Collected:  03/21/15 1421    Specimen Information:  Blood from Blood Updated:  03/21/15 1435     WBC 6.13 x10 3/uL      Hgb 12.9 (L) g/dL      Hematocrit 27.2 (L) %      Platelets 231 x10 3/uL      RBC 4.05 (L) x10 6/uL      MCV 96.5 fL      MCH 31.9 pg      MCHC 33.0 g/dL      RDW 14 %      MPV 10.3 fL      Neutrophils 74 %      Lymphocytes Automated 19 %      Monocytes 5 %      Eosinophils Automated 1 %      Basophils Automated 0 %      Immature Granulocyte 0 %      Nucleated RBC 0 /100 WBC      Neutrophils Absolute 4.54 x10 3/uL      Abs Lymph Automated 1.18 x10 3/uL  Abs Mono Automated 0.32 x10 3/uL      Abs Eos Automated 0.07 x10 3/uL      Absolute Baso Automated 0.02 x10 3/uL      Absolute Immature Granulocyte 0.01 x10 3/uL     Glucose Whole Blood - POCT [[id number]]  (Abnormal) Collected:  03/21/15 1354     POCT - Glucose Whole blood 202 (H) mg/dL Updated:  65/78/46 9629          Rads:     Radiology Results (24 Hour)     Procedure Component Value Units Date/Time    Shoulder Left 2+ Views [[id number]] Collected:  03/21/15 1613    Order Status:  Completed Updated:  03/21/15 1621    Narrative:      History: Shoulder pain    AP and Y view and internal rotation views demonstrate  moderate to marked  osteophyte is at the acromioclavicular joint and glenohumeral joint  without acute fracture or dislocation. There appears be ossification of  the coracoacromial ligament      Impression:       No acute fracture    Laurena Slimmer, MD   03/21/2015 4:17 PM      Knee 4+ Views Left [[id number]] Collected:  03/21/15 1613    Order Status:  Completed Updated:  03/21/15 1618    Narrative:      LEFT KNEE: 5  views     CLINICAL STATEMENT: Fall. Knee pain.     COMPARISON: No prior studies are available for comparison.    FINDINGS: There is no fracture or dislocation. The soft tissues are  unremarkable.      Impression:        No fracture.     Fonnie Mu, MD   03/21/2015 4:14 PM      CT Chest without Contrast [[id number]] Collected:  03/21/15 1501    Order Status:  Completed Updated:  03/21/15 1515    Narrative:      CLINICAL INDICATION: Left sided chest pain post fall. Evaluate for  fracture    COMPARISON: None available.    TECHNIQUE:  Helical CT scan of the chest was obtained from the apices to  the lung bases without  contrast as requested.    FINDINGS: There is some degradation by respiratory motion artifact. The  trachea bronchial tree is patent centrally. The lungs are clear  bilaterally. There is no gross mediastinal or hilar lymphadenopathy  noted in this unenhanced study. There is no pleural or pericardial  effusion.  The bone windows reveal no evidence of fracture of the bony  thorax.      Impression:       Unenhanced study within normal limits. Specifically the  bones reveal no fracture of the visualized bony thorax. No pneumothorax  or pleural effusion      Heron Nay, MD   03/21/2015 3:11 PM      CT Cervical Spine without Contrast [[id number]] Collected:  03/21/15 1454    Order Status:  Completed Updated:  03/21/15 1500    Narrative:      CLINICAL INDICATION:  Neck pain status post fall    TECHNIQUE:   Helical CT images were obtained through the cervical spine  from the skull base to the  thoracic inlet with coronal and sagittal  reformations. A combination of automated exposure control, adjustment of  the mA and/or kV according to patient size and/or use of iterative  reconstruction technique was utilized.    COMPARISON:  MRI  01/17/2014    INTERPRETATION:  No acute cervical spine fracture. Vertebral body  heights maintained. No spondylolisthesis. Multilevel anterior cervical  spondylosis. Disc space narrowing at C5-C6 and C6-C7. Multilevel disc  osteophyte complexes and central canal narrowing most significant at  C3-C4, C5-C6, and C6-C7. Multilevel facet degenerative changes. No  prevertebral soft tissue swelling. Degenerative change of the  atlantodens articulation.       Impression:        Degenerative changes without acute bony abnormality.         Mitali  Bapna, MD   03/21/2015 2:56 PM      CT Head WO Contrast [[id number]] Collected:  03/21/15 1452    Order Status:  Completed Updated:  03/21/15 1457    Narrative:      CLINICAL INDICATION:  Pain status post fall    TECHNIQUE:  Helical noncontrast CT images were obtained from the skull  base to the vertex. A combination of automated exposure control,  adjustment of the mA and/or kV according to patient size and/or use of  iterative reconstruction technique was utilized.    COMPARISON:  None available    INTERPRETATION: Generalized volume loss compatible with age. Deep white  matter hypodensity, presumed microvascular ischemic change.. No acute  intracranial hemorrhage, extra-axial collection, or mass-effect.  Gray-white differentiation maintained. Right basal ganglia lacunar  infarct. Visualized paranasal sinuses and mastoid air cells clear.  Deformity of the left lamina preparation suggesting old fracture.      Impression:         1. Age-indeterminate right basal ganglia lacunar infarct. If there is  clinical concern for acute ischemia, consider MRI follow-up.  2. No acute traumatic intracranial abnormality.    Mitali  Bapna, MD   03/21/2015 2:53  PM              Assessment:     81 year old male admitted with  History of fall.  Lacunar infarct basal ganglia.  Diabetes mellitus type 2        Plan:     Patient is admitted to [facility].  Neurologic checks.  Schedule patient for an MRI of the brain and MRA head and neck.  Blood sugar coverage with insulin  Deep vein thrombosis and gastrointestinal prophylaxis  Neurology consultant done.   PT/OT evaluation    Signed by: Loetta Rough, MD

## 2015-03-21 NOTE — ED Provider Notes (Signed)
EMERGENCY DEPARTMENT HISTORY AND PHYSICAL EXAM     Physician/Midlevel provider first contact with patient: 03/21/15 1357         Date: 03/21/2015  Patient Name: [name]    History of Presenting Illness     Chief Complaint   Patient presents with   . Fall   . Headache   . Back Pain   . Knee Pain     History Provided By: Patient and his wife    Chief Complaint: Fall  Onset: Midnight last night  Timing: Acute  Location: Onto left side  Quality:   Severity: Moderate  Modifying Factors: None  Associated Symptoms: Head injury, headache, neck pain, left sided chest pain, left lower back pain, left shoulder pain, left knee pain, dizziness (resolved)    Additional History: [name] is a 81 y.o. male presenting with acute fall from midnight last night. The pt reports that he had a large meal last night and felt dizzy while smoking a cigarette. Pt's wife states that the pt then fell down 5 cement steps and unsuccessfully attempted to reach something to catch his fall. She notes that the pt hit his head and vomited immediately, but the pt does not recall the activity. EMS was called and responded to the scene, but the pt refused treatment per wife. The pt states that he began to have pain symptoms this morning, which includes headache, neck pain, left sided chest pain, left lower back pain, left shoulder pain, and left knee pain. Pt reports that he is not dizzy today and denies any pain on his right side or abdominal pain. He notes eating cheese and bread today PTA.     PCP: [name], MD      No current facility-administered medications for this encounter.       Past History     Past Medical History:  Past Medical History   Diagnosis Date   . No diagnosis    . Diabetes mellitus        Past Surgical History:  Past Surgical History   Procedure Laterality Date   . Appendectomy         Family History:  No family history on file.    Social History:  History   Substance Use Topics   . Smoking status: Current Some  Day Smoker     Types: Cigarettes   . Smokeless tobacco: Not on file   . Alcohol Use: Yes       Allergies:  Allergies   Allergen Reactions   . Penicillins        Review of Systems     Review of Systems   Constitutional: Negative for fever.   HENT: Negative for ear discharge.         + head injury   Eyes: Negative for discharge.   Respiratory: Negative for shortness of breath.    Cardiovascular: Positive for chest pain (left sided).   Gastrointestinal: Negative for abdominal pain.   Musculoskeletal: Positive for back pain (left lower), arthralgias (left knee and left shoulder) and neck pain.        + fall - right side pain   Skin: Negative for rash.   Allergic/Immunologic:        + Penicillin allergy   Neurological: Positive for dizziness (resolved) and headaches.         Physical Exam   BP 164/73 mmHg  Pulse 75  Temp(Src) 97.3 F (36.3 C) (Oral)  Resp 16  SpO2  100%    Physical Exam   Constitutional: Patient is oriented to person, place, and time and well-developed, well-nourished, and in no distress.   Head: Normocephalic and atraumatic except for small hematoma on the posterior occiput.    Eyes: EOM are normal. Pupils are equal, round, and reactive to light.   Neck: There are no step-offs or deformities when palpating along cervical spine.  + tenderness along midline of lower cervical spine  Cardiovascular: Normal rate and regular rhythm.   Pulmonary/Chest: Effort normal and breath sounds normal. No respiratory distress.   Abdominal: Soft. There is no tenderness. Bowel sounds present and normal.  Musculoskeletal: Normal range of motion.    Neurological: Patient is alert and oriented to person, place, and time. GCS score is 15. No focal deficits.    Skin: Skin is warm and dry.     Diagnostic Study Results     Labs -     Results     Procedure Component Value Units Date/Time    Comprehensive metabolic panel [[id number]]  (Abnormal) Collected:  03/21/15 1421    Specimen Information:  Blood Updated:  03/21/15 1449      Glucose 217 (H) mg/dL      BUN 43.3 mg/dL      Creatinine 1.0 mg/dL      Sodium 295 mEq/L      Potassium 5.2 (H) mEq/L      Chloride 107 mEq/L      CO2 24 mEq/L      CALCIUM 9.1 mg/dL      Protein, Total 7.1 g/dL      Albumin 3.3 (L) g/dL      AST (SGOT) 23 U/L      ALT 13 U/L      Alkaline Phosphatase 64 U/L      Bilirubin, Total 0.3 mg/dL      Globulin 3.8 (H) g/dL      Albumin/Globulin Ratio 0.9      Anion Gap 8.0     Lipase [[id number]] Collected:  03/21/15 1421    Specimen Information:  Blood Updated:  03/21/15 1449     Lipase 30 U/L     Hemolysis index [[id number]]  (Abnormal) Collected:  03/21/15 1421     Hemolysis Index 103 (H) Updated:  03/21/15 1449    GFR [[id number]] Collected:  03/21/15 1421     EGFR >60.0 Updated:  03/21/15 1449    CBC with differential [[id number]]  (Abnormal) Collected:  03/21/15 1421    Specimen Information:  Blood from Blood Updated:  03/21/15 1435     WBC 6.13 x10 3/uL      Hgb 12.9 (L) g/dL      Hematocrit 76.2 (L) %      Platelets 231 x10 3/uL      RBC 4.05 (L) x10 6/uL      MCV 96.5 fL      MCH 31.9 pg      MCHC 33.0 g/dL      RDW 14 %      MPV 10.3 fL      Neutrophils 74 %      Lymphocytes Automated 19 %      Monocytes 5 %      Eosinophils Automated 1 %      Basophils Automated 0 %      Immature Granulocyte 0 %      Nucleated RBC 0 /100 WBC      Neutrophils Absolute 4.54 x10 3/uL      Abs  Lymph Automated 1.18 x10 3/uL      Abs Mono Automated 0.32 x10 3/uL      Abs Eos Automated 0.07 x10 3/uL      Absolute Baso Automated 0.02 x10 3/uL      Absolute Immature Granulocyte 0.01 x10 3/uL     Glucose Whole Blood - POCT [[id number]]  (Abnormal) Collected:  03/21/15 1354     POCT - Glucose Whole blood 202 (H) mg/dL Updated:  09/38/18 2993          Radiologic Studies -   Radiology Results (24 Hour)     Procedure Component Value Units Date/Time    Shoulder Left 2+ Views [[id number]] Collected:  03/21/15 1613    Order Status:  Completed Updated:  03/21/15 1621    Narrative:      History:  Shoulder pain    AP and Y view and internal rotation views demonstrate moderate to marked  osteophyte is at the acromioclavicular joint and glenohumeral joint  without acute fracture or dislocation. There appears be ossification of  the coracoacromial ligament      Impression:       No acute fracture    Laurena Slimmer, MD   03/21/2015 4:17 PM      Knee 4+ Views Left [[id number]] Collected:  03/21/15 1613    Order Status:  Completed Updated:  03/21/15 1618    Narrative:      LEFT KNEE: 5  views     CLINICAL STATEMENT: Fall. Knee pain.     COMPARISON: No prior studies are available for comparison.    FINDINGS: There is no fracture or dislocation. The soft tissues are  unremarkable.      Impression:        No fracture.     Fonnie Mu, MD   03/21/2015 4:14 PM      CT Chest without Contrast [[id number]] Collected:  03/21/15 1501    Order Status:  Completed Updated:  03/21/15 1515    Narrative:      CLINICAL INDICATION: Left sided chest pain post fall. Evaluate for  fracture    COMPARISON: None available.    TECHNIQUE:  Helical CT scan of the chest was obtained from the apices to  the lung bases without  contrast as requested.    FINDINGS: There is some degradation by respiratory motion artifact. The  trachea bronchial tree is patent centrally. The lungs are clear  bilaterally. There is no gross mediastinal or hilar lymphadenopathy  noted in this unenhanced study. There is no pleural or pericardial  effusion.  The bone windows reveal no evidence of fracture of the bony  thorax.      Impression:       Unenhanced study within normal limits. Specifically the  bones reveal no fracture of the visualized bony thorax. No pneumothorax  or pleural effusion      Heron Nay, MD   03/21/2015 3:11 PM      CT Cervical Spine without Contrast [[id number]] Collected:  03/21/15 1454    Order Status:  Completed Updated:  03/21/15 1500    Narrative:      CLINICAL INDICATION:  Neck pain status post fall    TECHNIQUE:   Helical CT images  were obtained through the cervical spine  from the skull base to the thoracic inlet with coronal and sagittal  reformations. A combination of automated exposure control, adjustment of  the mA and/or kV according to patient size and/or use of iterative  reconstruction technique was utilized.    COMPARISON:  MRI 01/17/2014    INTERPRETATION:  No acute cervical spine fracture. Vertebral body  heights maintained. No spondylolisthesis. Multilevel anterior cervical  spondylosis. Disc space narrowing at C5-C6 and C6-C7. Multilevel disc  osteophyte complexes and central canal narrowing most significant at  C3-C4, C5-C6, and C6-C7. Multilevel facet degenerative changes. No  prevertebral soft tissue swelling. Degenerative change of the  atlantodens articulation.       Impression:        Degenerative changes without acute bony abnormality.         Mitali  Bapna, MD   03/21/2015 2:56 PM      CT Head WO Contrast [[id number]] Collected:  03/21/15 1452    Order Status:  Completed Updated:  03/21/15 1457    Narrative:      CLINICAL INDICATION:  Pain status post fall    TECHNIQUE:  Helical noncontrast CT images were obtained from the skull  base to the vertex. A combination of automated exposure control,  adjustment of the mA and/or kV according to patient size and/or use of  iterative reconstruction technique was utilized.    COMPARISON:  None available    INTERPRETATION: Generalized volume loss compatible with age. Deep white  matter hypodensity, presumed microvascular ischemic change.. No acute  intracranial hemorrhage, extra-axial collection, or mass-effect.  Gray-white differentiation maintained. Right basal ganglia lacunar  infarct. Visualized paranasal sinuses and mastoid air cells clear.  Deformity of the left lamina preparation suggesting old fracture.      Impression:         1. Age-indeterminate right basal ganglia lacunar infarct. If there is  clinical concern for acute ischemia, consider MRI follow-up.  2. No acute  traumatic intracranial abnormality.    Mitali  Bapna, MD   03/21/2015 2:53 PM        .      Medical Decision Making   I am the first provider for this patient.    I reviewed the vital signs, available nursing notes, past medical history, past surgical history, family history and social history.    Vital Signs-Reviewed the patient's vital signs.     Patient Vitals for the past 12 hrs:   BP Temp Pulse Resp   03/21/15 1719 164/73 mmHg - 75 16   03/21/15 1641 159/77 mmHg 97.3 F (36.3 C) 72 16   03/21/15 1337 144/65 mmHg (!) 96.9 F (36.1 C) 84 18       Pulse Oximetry Analysis - Normal 99% on RA    EKG:  Interpreted by the EP.   Time Interpreted: 1341   Rate: 87   Rhythm: Normal Sinus Rhythm    Interpretation: no ST changes   Comparison: no significant change from prior 10/31/06      Old Medical Records: Old medical records.  Nursing notes.     The pt was diagnosed with diverticulitis on 03/05/2015. Pt was given Cipro and Flagyl.     ED Course:   4:31 PM -  Discussed test results with the pt and his family. Pt's family confirms that the pt was dizzy preceding the fall. Informed the pt and his family of plan for hospitalization. They are agreeable to plan.     5:09 PM -  Dr. Arletha Pili, covering for Dr. Graciela Husbands the accepting physician for pt's PCP, requests consulting on-call [facility], [facility].     5:10 PM - Discussed pt case with Dr. Jomarie Longs, on-call [facility], [facility], who accepts the  pt for hospitalization under Dr. Odessa Fleming (accepting physician for pt's PCP) service.     5:31 PM - Discussed pt case with Dr. Moise Boring, on-call neurology, who agrees to consult.     Provider Notes: 77 M with DM2 presents with an episode of dizziness resulting in a fall, with head injury and vomiting episode.  Patient has no neurologic deficits.   CT scan reveals a right basal ganglia lacunar infarct.  Patient admitted to medicine, MRI ordered.  Dr. Moise Boring, neurology, agrees with plan and will consult.  Patient given aspirin.       Diagnosis     Clinical Impression:   1. Lacunar infarction    2. Fall, initial encounter    3. Type 2 diabetes mellitus with complication        _______________________________    Attestations:  This note is prepared by Jodene Nam, acting as Scribe for Brooke Bonito, MD.    Brooke Bonito, MD. The scribe's documentation has been prepared under my direction and personally reviewed by me in its entirety.  I confirm that the note above accurately reflects all work, treatment, procedures, and medical decision making performed by me.    _______________________________         Brooke Bonito, DO  03/21/15 1814

## 2015-03-21 NOTE — Plan of Care (Signed)
Problem: Day of Admission- Stroke  Goal: Neurological status is stable or improving  Outcome: Progressing  The patient and care giver's learning abilities have been assessed. Today's individualized plan of care includes MRI of the brain and MRA head and neck done result is pending, blood sugar checks, DVT/GI prophylaxis, neuro checks, neuro consult and PT/OT evaluatio was discussed with the patient and care giver and agree to it. Patient and care giver demonstrates understanding of disease process, treatment plan, medications and consequences of noncompliance. All questions and concerns were addressed.          n

## 2015-03-22 ENCOUNTER — Inpatient Hospital Stay: Payer: Medicare Other

## 2015-03-22 LAB — GLUCOSE WHOLE BLOOD - POCT
Whole Blood Glucose POCT: 111 mg/dL — ABNORMAL HIGH (ref 70–100)
Whole Blood Glucose POCT: 158 mg/dL — ABNORMAL HIGH (ref 70–100)
Whole Blood Glucose POCT: 191 mg/dL — ABNORMAL HIGH (ref 70–100)
Whole Blood Glucose POCT: 79 mg/dL (ref 70–100)
Whole Blood Glucose POCT: 83 mg/dL (ref 70–100)

## 2015-03-22 LAB — ECG 12-LEAD
Atrial Rate: 87 {beats}/min
P Axis: 64 degrees
P-R Interval: 174 ms
Q-T Interval: 358 ms
QRS Duration: 86 ms
QTC Calculation (Bezet): 430 ms
R Axis: 67 degrees
T Axis: 60 degrees
Ventricular Rate: 87 {beats}/min

## 2015-03-22 MED ORDER — GLIMEPIRIDE 2 MG PO TABS
2.0000 mg | ORAL_TABLET | Freq: Every morning | ORAL | Status: DC
Start: 2015-03-22 — End: 2015-03-23
  Administered 2015-03-22 – 2015-03-23 (×2): 2 mg via ORAL
  Filled 2015-03-22 (×3): qty 1

## 2015-03-22 MED ORDER — SIMVASTATIN 40 MG PO TABS
40.0000 mg | ORAL_TABLET | Freq: Every evening | ORAL | Status: DC
Start: 2015-03-22 — End: 2015-03-23
  Administered 2015-03-22: 40 mg via ORAL
  Filled 2015-03-22: qty 1

## 2015-03-22 MED ORDER — PIOGLITAZONE HCL 15 MG PO TABS
45.0000 mg | ORAL_TABLET | Freq: Every morning | ORAL | Status: DC
Start: 2015-03-22 — End: 2015-03-23
  Administered 2015-03-22 – 2015-03-23 (×2): 45 mg via ORAL
  Filled 2015-03-22 (×3): qty 3

## 2015-03-22 MED ORDER — ACETAMINOPHEN 325 MG PO TABS
650.0000 mg | ORAL_TABLET | Freq: Four times a day (QID) | ORAL | Status: DC | PRN
Start: 2015-03-22 — End: 2015-03-23
  Administered 2015-03-22: 650 mg via ORAL
  Filled 2015-03-22: qty 2

## 2015-03-22 NOTE — Plan of Care (Signed)
Problem: Day of Admission- Stroke  Goal: Neurovascular Status is Stable or Improving  Outcome: Progressing  The patient's learning abilities have been assessed. Today's individualized plan of care to go for Echocardiogram, US carotid Duplex, Dopp Com bilateral.  Plan was discussed with the patient  and agree to it. Patient demonstrates understanding of disease process, treatment plan, medications and consequences of noncompliance. All questions and concerns were addressed.

## 2015-03-22 NOTE — Progress Notes (Signed)
Progress Notes    Date Time: 03/22/2015 3:07 PM  Patient Name: [facility]     Patient Active Problem List   Diagnosis   . Lacunar infarction     Assessment:    Small acute infarct left anterior pons   Old right basal ganglia infarct   Fall    Diabetes mellitus type 2   Back pain from fall  Plan:    Carotid Ultrasound   2D echo   Lipid panel   Continue  telemetry   PT eval for gait   Continue ASA, statin   Glycemic control and BP control   DVT prophylaxis   Discussed with patient in detail    Plan discussed with Patient/Family/Staff/Consultants involved.    Subjective:   Left sided upper back pain   No other symptoms    Review of Systems:   Constitutional: Negative for fever, chills, weight loss, malaise/fatigue and diaphoresis.   HEENT: No headache, dizziness, nausea or  vomiting.   Respiratory: Denies cough, shortness of breath.   Cardiovascular: No Chest pain. No shortness of breath. Negative for palpitations.   Gastrointestinal: No abdominal pain, No nausea, vomiting, diarrhea or  constipation.   Genitourinary: No burning micturition.   Musculoskeletal: back pain   Neurological: Denies headache, no nausea.Negative for weakness.   Endo/Heme/Allergies: Does not bruise/bleed easily.  Medications:     Current Facility-Administered Medications   Medication Dose Route Frequency Provider Last Rate Last Dose   . aspirin chewable tablet 324 mg  324 mg Oral Daily Loetta Rough, MD   324 mg at 03/22/15 1252   . dextrose 50 % bolus 25 mL  25 mL Intravenous PRN Loetta Rough, MD       . enoxaparin (LOVENOX) syringe 40 mg  40 mg Subcutaneous Daily Loetta Rough, MD   40 mg at 03/22/15 1610   . famotidine (PEPCID) tablet 20 mg  20 mg Oral Q12H [facility] Loetta Rough, MD   20 mg at 03/22/15 9604   . glucagon (rDNA) (GLUCAGEN) injection 1 mg  1 mg Intramuscular PRN Loetta Rough, MD       . insulin regular (HumuLIN R,NovoLIN R) injection 1-8 Units  1-8 Units Subcutaneous TID AC PRN Loetta Rough, MD       .  simvastatin (ZOCOR) tablet 40 mg  40 mg Oral QHS Jaquita Rector, MD           Physical Exam:     Filed Vitals:    03/22/15 1212   BP: 139/72   Pulse: 74   Temp: 97 F (36.1 C)   Resp: 16   SpO2: 99%     Temp (24hrs), Avg:97.1 F (36.2 C), Min:95.7 F (35.4 C), Max:97.7 F (36.5 C)    Intake and Output Summary (Last 24 hours) at Date Time    Intake/Output Summary (Last 24 hours) at 03/22/15 1507  Last data filed at 03/22/15 0900   Gross per 24 hour   Intake    320 ml   Output      0 ml   Net    320 ml     General Appearance: No acute distress.  Head:  Normocephalic  Eyes:  No pallor, cyanosis, icterus, PERLA, EOM's intact.  Neck:  Supple, No JVD  Lungs:  Clear to auscultation bilaterally, no wheezes, rhonchi or rales.  Heart:  S1, S2 normal, no murmurs.  Abdomen:  Soft, non-tender, non-distended, positive bowel sounds.  Extremities:  No cyanosis, clubbing or edema.  Neurologic:  Alert and oriented x3, mood and affect normal. Cranial Nerves II-XII grossly intact.    Labs:     Results     Procedure Component Value Units Date/Time    Glucose Whole Blood - POCT [[id number]]  (Abnormal) Collected:  03/22/15 1147     POCT - Glucose Whole blood 111 (H) mg/dL Updated:  47/42/59 5638    Glucose Whole Blood - POCT [[id number]] Collected:  03/22/15 0652     POCT - Glucose Whole blood 79 mg/dL Updated:  18/84/16 6063    Glucose Whole Blood - POCT [[id number]]  (Abnormal) Collected:  03/21/15 2233     POCT - Glucose Whole blood 176 (H) mg/dL Updated:  35/57/32 2025          Rads:     Radiology Results (24 Hour)     Procedure Component Value Units Date/Time    US Carotid Duplex Dopp Comp Bilateral [[id number]] Resulted:  03/22/15 1040    Order Status:  Sent Updated:  03/22/15 1051    MRI Brain with/without Contrast [[id number]] Collected:  03/21/15 2140    Order Status:  Completed Updated:  03/21/15 2148    Narrative:      Indication: Right basilar infarct.    TECHNIQUE: An MRI of the brain was performed utilizing the  following  pulse sequences:  sagittal and axial T1-weighted , T2-weighted axial,  FLAIR axial, and diffusion weighted axial images.  Following IV contrast  administration, T1-weighted axial and T1-weighted coronal images were  obtained. 7 cc of Gadavist was utilized intravenously    PRIORS: CT 03/21/2015.     FINDINGS: Sagittal screening of the pituitary and pineal regions are  unremarkable. The cerebellum is normal in position. Screening of the  cerebellar pontine angle and eighth nerve complexes are unremarkable.The  ventricular system demonstrates generalized volume loss.. There is no  intracranial hemorrhage. There is no herniation. There are no extraaxial  fluid collections. There are no areas of abnormal signal intensity. The  diffusion sequences demonstrate a small infarct in the left pons. The  small vessel infarct in the right basal ganglion is not acute. Small  vessel changes in the right posterior limb of the internal capsule which  is chronic. There was no abnormal enhancement. The visualized portions  of the paranasal sinuses are clear.      Impression:        Small acute infarct in the anterior left pons. Old right  basal ganglion and internal capsule infarcts. Mild atrophy.Kinnie Feil, MD   03/21/2015 9:44 PM      Shoulder Left 2+ Views [[id number]] Collected:  03/21/15 1613    Order Status:  Completed Updated:  03/21/15 1621    Narrative:      History: Shoulder pain    AP and Y view and internal rotation views demonstrate moderate to marked  osteophyte is at the acromioclavicular joint and glenohumeral joint  without acute fracture or dislocation. There appears be ossification of  the coracoacromial ligament      Impression:       No acute fracture    Laurena Slimmer, MD   03/21/2015 4:17 PM      Knee 4+ Views Left [[id number]] Collected:  03/21/15 1613    Order Status:  Completed Updated:  03/21/15 1618    Narrative:      LEFT KNEE: 5  views     CLINICAL STATEMENT: Fall. Knee pain.     COMPARISON:  No prior studies  are available for comparison.    FINDINGS: There is no fracture or dislocation. The soft tissues are  unremarkable.      Impression:        No fracture.     Fonnie Mu, MD   03/21/2015 4:14 PM      CT Chest without Contrast [[id number]] Collected:  03/21/15 1501    Order Status:  Completed Updated:  03/21/15 1515    Narrative:      CLINICAL INDICATION: Left sided chest pain post fall. Evaluate for  fracture    COMPARISON: None available.    TECHNIQUE:  Helical CT scan of the chest was obtained from the apices to  the lung bases without  contrast as requested.    FINDINGS: There is some degradation by respiratory motion artifact. The  trachea bronchial tree is patent centrally. The lungs are clear  bilaterally. There is no gross mediastinal or hilar lymphadenopathy  noted in this unenhanced study. There is no pleural or pericardial  effusion.  The bone windows reveal no evidence of fracture of the bony  thorax.      Impression:       Unenhanced study within normal limits. Specifically the  bones reveal no fracture of the visualized bony thorax. No pneumothorax  or pleural effusion      Heron Nay, MD   03/21/2015 3:11 PM            Signed by: Lourena Simmonds Braiden Presutti

## 2015-03-22 NOTE — Progress Notes (Signed)
Blood sugar was 79 orange juice given. Am nurse will recheck.

## 2015-03-22 NOTE — Plan of Care (Signed)
Problem: Physical Therapy  Goal: Patient condition is improving per Physical Therapy Treatment Plan  Outcome: Adequate for Discharge  Discharge Recommendation: Home with no needs       Is an Occupational Therapy Evaluation Indicated at this time? No, the patient does not require a OT evaluation.    Treatment/Interventions: No skilled interventions needed at this time  PT Frequency: one time visit     Early/Progressive Mobility Protocol Level: Independent  Updated on Patient's Flip Chart in Room  (Please See Therapy Evaluation for device and assistance level needed)    Goals:   Goals  Goal Formulation: With patient  Time for Goal Acheivement: 1 visit  Goals: Select goal  Pt Will Ambulate: > 200 feet, independent, to maximize functional mobility and independence  Pt Will Perform Home Exer Program: independent, to maximize functional mobility and independence  Other Goal: Demonstrate safety understanding with mobility

## 2015-03-22 NOTE — Consults (Signed)
NEUROLOGY CONSULATION    Date Time: 03/22/2015 8:50 AM  Patient Name: [name]  Attending Physician: Gean Quint, MD      Assessment & Plan:   81 yr old male with DM II who had a fall and found to have old right basal ganglia ischemic infarct and a new small area of ischemic stroke in the left anterior pons   This is a very punctate area of restricted diffusion thus he will have a good prognosis for recovery as he feels back to baseline.  However, he will need further workup with carotid US and a TTE. Lipid panel ordered   Maintain on telemetry   PT eval for gait   Continue secondary stroke prevention medication with ASA, statin, tight glycemic control and BP control   Outpatient neurology follow up in 4-6 weeks   Some of his gait trouble may also be from poor sensory in feet with previous low normal B12 and DM II      History of Present Illness:     This is an 81 yr old male with a history of DM II, HPL who presented to the ED after a fall.  Patient states he was in his usual state of health and then yesterday evening after eating went to have a "smoke" and then felt dizzy and stumbled to have a fall.  He denies any LOC with this nor any loss of bowel or bladder control.  He eventually was convinced to come to the ED and his head CT demonstrated and age indeterminate right Basal ganglia infarct.  MRi was obtained and that area was an old infarct but he had a new small area of restricted diffusion in the left anterior pons consistent with an acute ischemic stroke.  Neurology consulted for further assistance. Glucose >200 on arrival    Past Medical History:     Past Medical History   Diagnosis Date   . No diagnosis    . Diabetes mellitus        Meds:      Scheduled Meds: PRN Meds:      aspirin 324 mg Oral Once   enoxaparin 40 mg Subcutaneous Daily   famotidine 20 mg Oral Q12H [facility]       Continuous Infusions:      dextrose 25 mL PRN   glucagon (rDNA) 1 mg PRN   insulin regular 1-8 Units TID AC PRN          I personally reviewed all of the medications    Allergies   Allergen Reactions   . Penicillins        Social & Family History:     History     Social History   . Marital Status: Single     Spouse Name: N/A   . Number of Children: N/A   . Years of Education: N/A     Occupational History   . Not on file.     Social History Main Topics   . Smoking status: Current Some Day Smoker     Types: Cigarettes   . Smokeless tobacco: Not on file   . Alcohol Use: Yes   . Drug Use: No   . Sexual Activity: Not on file     Other Topics Concern   . Not on file     Social History Narrative     No family history on file.    Review of Systems:   No headache, eye, ear nose, throat problems; no coughing  or wheezing or shortness of breath, No chest pain or orthopnea, no abdominal pain, nausea or vomiting, No pain in the body or extremities, no psychiatric, neurological, endocrine, hematological or cardiac complaints except as noted above.       Physical Exam:   Blood pressure 146/76, pulse 72, temperature 95.7 F (35.4 C), temperature source Oral, resp. rate 14, height 1.702 m (5\' 7" ), weight 63.912 kg (140 lb 14.4 oz), SpO2 100 %.    HEENT: Normocephalic. Non-icter, no congestion, no carotid bruits  Optho: sharp discs, no other abnormalities  Lungs:  CTA bil  Cardiac:  S1,S2, normal rate and rhythm  Neck: supple  Extremities: no clubbing, cyanosis, or edema  Skin: no rashes or lesions noted    Neuro:  Level of consciousness:  Alert and appropriate  Oriented:  X 3  Cognition:  Intact naming, recognition, concentration and following complex commands  Cranial Nerves:  II-XII intact  Strength:  No upper extremity drift, 5/5 strength x 4 extremities  Coordination:  Intact FTN testing  Reflexes:  +2 throughout, down going toes bil  Sensation: Decreased LT and vibration in feet   Gait:  Deferred until PT eval        Labs:     Recent Labs  Lab 03/21/15  1421   GLUCOSE 217*   BUN 18.0   CREATININE 1.0   CALCIUM 9.1   SODIUM 139   POTASSIUM  5.2*   CHLORIDE 107   CO2 24   ALBUMIN 3.3*   AST (SGOT) 23   ALT 13   BILIRUBIN, TOTAL 0.3   ALKALINE PHOSPHATASE 64       Recent Labs  Lab 03/21/15  1421   WBC 6.13   HGB 12.9*   HEMATOCRIT 39.1*   MCV 96.5   MCH 31.9   MCHC 33.0   PLATELETS 231         No results for input(s): PTT, PT, INR in the last 72 hours.       Radiology Results (24 Hour)     Procedure Component Value Units Date/Time    MRI Brain with/without Contrast [[id number]] Collected:  03/21/15 2140    Order Status:  Completed Updated:  03/21/15 2148    Narrative:      Indication: Right basilar infarct.    TECHNIQUE: An MRI of the brain was performed utilizing the following  pulse sequences:  sagittal and axial T1-weighted , T2-weighted axial,  FLAIR axial, and diffusion weighted axial images.  Following IV contrast  administration, T1-weighted axial and T1-weighted coronal images were  obtained. 7 cc of Gadavist was utilized intravenously    PRIORS: CT 03/21/2015.     FINDINGS: Sagittal screening of the pituitary and pineal regions are  unremarkable. The cerebellum is normal in position. Screening of the  cerebellar pontine angle and eighth nerve complexes are unremarkable.The  ventricular system demonstrates generalized volume loss.. There is no  intracranial hemorrhage. There is no herniation. There are no extraaxial  fluid collections. There are no areas of abnormal signal intensity. The  diffusion sequences demonstrate a small infarct in the left pons. The  small vessel infarct in the right basal ganglion is not acute. Small  vessel changes in the right posterior limb of the internal capsule which  is chronic. There was no abnormal enhancement. The visualized portions  of the paranasal sinuses are clear.      Impression:        Small acute infarct in the anterior left pons. Old  right  basal ganglion and internal capsule infarcts. Mild atrophy.Kinnie Feil, MD   03/21/2015 9:44 PM      Shoulder Left 2+ Views [[id number]] Collected:  03/21/15  1613    Order Status:  Completed Updated:  03/21/15 1621    Narrative:      History: Shoulder pain    AP and Y view and internal rotation views demonstrate moderate to marked  osteophyte is at the acromioclavicular joint and glenohumeral joint  without acute fracture or dislocation. There appears be ossification of  the coracoacromial ligament      Impression:       No acute fracture    Laurena Slimmer, MD   03/21/2015 4:17 PM      Knee 4+ Views Left [[id number]] Collected:  03/21/15 1613    Order Status:  Completed Updated:  03/21/15 1618    Narrative:      LEFT KNEE: 5  views     CLINICAL STATEMENT: Fall. Knee pain.     COMPARISON: No prior studies are available for comparison.    FINDINGS: There is no fracture or dislocation. The soft tissues are  unremarkable.      Impression:        No fracture.     Fonnie Mu, MD   03/21/2015 4:14 PM      CT Chest without Contrast [[id number]] Collected:  03/21/15 1501    Order Status:  Completed Updated:  03/21/15 1515    Narrative:      CLINICAL INDICATION: Left sided chest pain post fall. Evaluate for  fracture    COMPARISON: None available.    TECHNIQUE:  Helical CT scan of the chest was obtained from the apices to  the lung bases without  contrast as requested.    FINDINGS: There is some degradation by respiratory motion artifact. The  trachea bronchial tree is patent centrally. The lungs are clear  bilaterally. There is no gross mediastinal or hilar lymphadenopathy  noted in this unenhanced study. There is no pleural or pericardial  effusion.  The bone windows reveal no evidence of fracture of the bony  thorax.      Impression:       Unenhanced study within normal limits. Specifically the  bones reveal no fracture of the visualized bony thorax. No pneumothorax  or pleural effusion      Heron Nay, MD   03/21/2015 3:11 PM      CT Cervical Spine without Contrast [[id number]] Collected:  03/21/15 1454    Order Status:  Completed Updated:  03/21/15 1500    Narrative:       CLINICAL INDICATION:  Neck pain status post fall    TECHNIQUE:   Helical CT images were obtained through the cervical spine  from the skull base to the thoracic inlet with coronal and sagittal  reformations. A combination of automated exposure control, adjustment of  the mA and/or kV according to patient size and/or use of iterative  reconstruction technique was utilized.    COMPARISON:  MRI 01/17/2014    INTERPRETATION:  No acute cervical spine fracture. Vertebral body  heights maintained. No spondylolisthesis. Multilevel anterior cervical  spondylosis. Disc space narrowing at C5-C6 and C6-C7. Multilevel disc  osteophyte complexes and central canal narrowing most significant at  C3-C4, C5-C6, and C6-C7. Multilevel facet degenerative changes. No  prevertebral soft tissue swelling. Degenerative change of the  atlantodens articulation.       Impression:  Degenerative changes without acute bony abnormality.         Mitali  Bapna, MD   03/21/2015 2:56 PM      CT Head WO Contrast [[id number]] Collected:  03/21/15 1452    Order Status:  Completed Updated:  03/21/15 1457    Narrative:      CLINICAL INDICATION:  Pain status post fall    TECHNIQUE:  Helical noncontrast CT images were obtained from the skull  base to the vertex. A combination of automated exposure control,  adjustment of the mA and/or kV according to patient size and/or use of  iterative reconstruction technique was utilized.    COMPARISON:  None available    INTERPRETATION: Generalized volume loss compatible with age. Deep white  matter hypodensity, presumed microvascular ischemic change.. No acute  intracranial hemorrhage, extra-axial collection, or mass-effect.  Gray-white differentiation maintained. Right basal ganglia lacunar  infarct. Visualized paranasal sinuses and mastoid air cells clear.  Deformity of the left lamina preparation suggesting old fracture.      Impression:         1. Age-indeterminate right basal ganglia lacunar infarct. If there  is  clinical concern for acute ischemia, consider MRI follow-up.  2. No acute traumatic intracranial abnormality.    Mitali  Bapna, MD   03/21/2015 2:53 PM             All recent brain and spine imaging (MRI, CT) personally reviewed.    Chart reviewed    Case discussed with patient at bedside      Signed by: Jaquita Rector, MD  Spectralink: [phone]       Answering Service: [phone]

## 2015-03-22 NOTE — PT Eval Note (Signed)
.  [facility]  Physical Therapy Attempt Note    Patient:  [name] MRN#:  [id number]  Unit:  [facility] Room/Bed:  V4098/J1914-N       Physical Therapy evaluation attempted on 03/22/2015, unable to complete secondary to pt off floor for echo procedure per RN.      RN notified. Will follow up as able.  Genene Churn. Recie Cirrincione PT, MSPT  [phone]  .

## 2015-03-22 NOTE — PT Eval Note (Signed)
[facility]  Physical Therapy Evaluation and Treatment    Patient: [name]     MRN#: [id number]   Unit: [facility]  Bed: V4098/J1914-N    Time of Evaluation:  Time Calculation  PT Received On: 03/22/2015  Start Time: 1300   Stop Time: 1310  Time Calculation (min): 10    Time of Treatment:  Time Calculation  PT Received On: 03/22/2015  Start Time: 1310  Stop Time: 1322  Time Calculation (min): 12    Consult received for [name] for PT Evaluation and Treatment.  Patient's medical condition is appropriate for Physical therapy intervention at this time.    Activity Orders: PT evaluation and treatment    Precautions and Contraindications:   Precautions  Other Precautions: None    Medical Diagnosis: Lacunar infarction [I63.9]  Fall, initial encounter [W19.XXXA]  Type 2 diabetes mellitus with complication [E11.8]    History of Present Illness: [name] is a 81 y.o. male admitted on 03/21/2015 with c/o fall on the night prior to admission. Pt has pain to head, chest, left lower back, left shoulder, and left knee. Pt has no memory immediately after event. Family reports that pt stated that he felt dizzy and needed to sit, then fell down 5 cement steps striking his head. Pt immediately vomited after event      Patient Active Problem List   Diagnosis   . Lacunar infarction        Past Medical/Surgical History:  Past Medical History   Diagnosis Date   . No diagnosis    . Diabetes mellitus       Past Surgical History   Procedure Laterality Date   . Appendectomy           X-Rays/Tests/Labs:  Lab Results   Component Value Date/Time    HGB 12.9* 03/21/2015 02:21 PM    HEMATOCRIT 39.1* 03/21/2015 02:21 PM    POTASSIUM 5.2* 03/21/2015 02:21 PM    SODIUM 139 03/21/2015 02:21 PM     MRI brain 03/22/15  Small acute infarct in the anterior left pons. Old right  basal ganglion and internal capsule infarcts. Mild atrophy..    XR knee 03/21/15  No fracture.      XR shoulder 03/21/15  No acute  fracture    CT head 03/21/15  1. Age-indeterminate right basal ganglia lacunar infarct. If there is  clinical concern for acute ischemia, consider MRI follow-up.  2. No acute traumatic intracranial abnormality.    CT Cervical spine 03/21/15    Degenerative changes without acute bony abnormality.      CT chest 03/21/15   Unenhanced study within normal limits. Specifically the  bones reveal no fracture of the visualized bony thorax. No pneumothorax  or pleural effusion      Social History:  Prior Level of Function  Prior level of function: Ambulates independently, Independent with ADLs  Baseline Activity Level: Community ambulation  Driving: independent  Cooking: Yes  Employment: Retired    Cabin crew Arrangements: Alone  Type of Home: Apartment  Home Layout: One level  Bathroom Shower/Tub: Medical sales representative: Standard  Bathroom Accessibility: Accessible    Subjective: Patient is agreeable to participation in the therapy session. Nursing clears patient for therapy.   Patient Goal: Home  Pain Assessment  Pain Assessment: Numeric Scale (0-10)  Pain Score: 10-severe pain (Pt unreliable rating. Does not appear to be distressed)  Pain Location: Back    Objective:  Observation  of Patient/Vital Signs:  Filed Vitals:    03/22/15 1212   BP: 139/72   Pulse: 74   Temp: 97 F (36.1 C)   Resp: 16   SpO2: 99%         Patient received in bed with Telemetry in place.      Inspection/Posture: WFL    Cognitive Status and Neuro Exam:  Cognition/Neuro Status  Arousal/Alertness: Appropriate responses to stimuli  Attention Span: Appears intact  Orientation Level: Oriented X4  Memory: Appears intact  Following Commands: Follows all commands and directions without difficulty  Safety Awareness: minimal verbal instruction  Problem Solving: Able to problem solve independently  Behavior: calm;cooperative      Musculoskeletal Examination  Gross ROM  Neck/Trunk ROM: within functional limits  Right Upper Extremity  ROM: within functional limits  Left Upper Extremity ROM: within functional limits  Right Lower Extremity ROM: within functional limits  Left Lower Extremity ROM: within functional limits  Gross Strength  Neck/Trunk Strength: 5/5  Right Upper Extremity Strength: 5/5  Left Upper Extremity Strength: 5/5  Right Lower Extremity Strength: 5/5  Left Lower Extremity Strength: 5/5  Tone  Tone: within functional limits    Functional Mobility:  Functional Mobility  Rolling: Independent  Supine to Sit: Independent  Sit to Supine: Independent  Sit to Stand: Independent  Stand to Sit: Independent     Locomotion  Ambulation: Independent  Ambulation Distance (Feet): 250 Feet  Pattern: within functional limits     Balance  Balance  Balance: within functional limits    Participation and Activity Tolerance  Participation and Endurance  Participation Effort: excellent  Endurance: Tolerates 10 - 20 min exercise with multiple rests  [place] Dyspnea Scale: 0 Dyspnea    Educated the patient to role of physical therapy, plan of care, goals of therapy and HEP, safety with mobility and ADLs, discharge instructions, home safety.   Patient left in bed with telemetry in place and call bell within reach. RN notified of session outcome.     Assessment: [name] is a 81 y.o. male admitted 03/21/2015 with c/o fall on the night prior to admission. Pt has pain to head, chest, left lower back, left shoulder, and left knee. Pt has no memory immediately after event. Family reports that pt stated that he felt dizzy and needed to sit, then fell down 5 cement steps striking his head. Pt immediately vomited after event  .    Pt presents with the following impairments: Assessment: Appears to be at baseline for mobility;Appears to be at baseline for balance;Appears to be at baseline with current Assistive Device.    Pt is at sufficient level of function for D/C from PT.  No further skilled PT intervention indicated at this time.    Treatment:  Educated pt on safety with mobility.  Mobilized with pt with instruction for technique and safety for 150 ft.  Pt performed supine therapeutic exercise x 10 each - ankle pumps, SLR, shoulder flexion, Pt instructed to perform therapeutic exercises frequently throughout day.  Educated pt on importance of time out of bed and continuing ambulation while in hospital.  All questions and concerns were addressed.     Plan: Treatment/Interventions: No skilled interventions needed at this time   PT Frequency: one time visit   Risks/Benefits/POC Discussed with Pt/Family: With patient     G codes: yes  Mobility, Current Status (Z6109): CH  Mobility, Goal Status (U0454): CH  Mobility, D/C Status (U9811): [facility]  Goals:   Goals  Goal Formulation: With patient  Time for Goal Acheivement: 1 visit  Goals: Select goal  Pt Will Ambulate: > 200 feet, independent, to maximize functional mobility and independence  Pt Will Perform Home Exer Program: independent, to maximize functional mobility and independence  Other Goal: Demonstrate safety understanding with mobility         Discharge physical therapy.  Discharge Recommendation: Home with no needs    Genene Churn. Analyse Angst PT, MSPT  [phone]

## 2015-03-23 LAB — LIPID PANEL
Cholesterol / HDL Ratio: 4.3
Cholesterol: 165 mg/dL (ref 0–199)
HDL: 38 mg/dL — ABNORMAL LOW (ref >40)
LDL Calculated: 112 mg/dL — ABNORMAL HIGH (ref 0–99)
Triglycerides: 75 mg/dL (ref 34–149)
VLDL Calculated: 15 mg/dL (ref 10–40)

## 2015-03-23 LAB — HEMOLYSIS INDEX: Hemolysis Index: 5 (ref 0–18)

## 2015-03-23 LAB — GLUCOSE WHOLE BLOOD - POCT
Whole Blood Glucose POCT: 107 mg/dL — ABNORMAL HIGH (ref 70–100)
Whole Blood Glucose POCT: 304 mg/dL — ABNORMAL HIGH (ref 70–100)
Whole Blood Glucose POCT: 201 mg/dL — ABNORMAL HIGH (ref 70–100)

## 2015-03-23 LAB — HEMOGLOBIN A1C: Hemoglobin A1C: 6.1 % — ABNORMAL HIGH (ref 0.0–6.0)

## 2015-03-23 MED ORDER — SIMVASTATIN 40 MG PO TABS
40.0000 mg | ORAL_TABLET | Freq: Every evening | ORAL | Status: DC
Start: 2015-03-23 — End: 2017-04-23

## 2015-03-23 MED ORDER — ASPIRIN 325 MG PO TABS
325.0000 mg | ORAL_TABLET | Freq: Every day | ORAL | Status: DC
Start: 2015-03-23 — End: 2015-07-01

## 2015-03-23 NOTE — Plan of Care (Signed)
Problem: Day 2- Stroke  Goal: Stable vital signs and fluid balance  Outcome: Progressing  The patient and care giver's learning abilities have been assessed. Today's individualized plan of care to Patient safety,PT/OT,monitor BS, Neuro check, pain mangement and am lans.POC was discussed with the patient and care giver and agree to it. Patient and care giver demonstrates understanding of disease process, treatment plan, medications and consequences of noncompliance. All questions and concerns were addressed.

## 2015-03-23 NOTE — Discharge Instructions (Signed)
Stroke(Completed)    You have had a mild stroke, also called CVA, or cerebrovascular accident. This is caused by a loss of blood flow to part of your brain. This can occur when a blood clot forms inside the carotid artery (main artery from the heart to the brain) or insidethe heart (as with atrial fibrillation, an irregular heart rhythm). When the clot travels to the brain, it can lodge in a blood vessel and block blood flow. The other common cause of stroke is a gradual narrowing of the arteries in the brain due to atherosclerosis (hardening of the arteries).  Once you have had a stroke, you are at risk of having another.Therefore, be sure to follow up with your doctor for further evaluation and treatment. This may include an ultrasound of the arteries in your neck and an evaluation of your heart. If problems are found, your doctor will recommend treatment with medications and/or procedures.  Medications to reduce your chance of having another stroke include those that prevent blood clots, such as antiplatelet medicines (aspirin and Plavix) and anticoagulant medicines (warfarin).  Home Care:   Rest at home and avoid exertion for the next few days.   If your doctor has prescribed antiplatelet medication, take it as directed.  Follow Up:  Call your doctor for an appointment in the next few days for a repeat exam. Additional tests may be needed. If you had an x-ray, CT scan, MRI scan, or ECG (electrocardiogram), it will be reviewed by a specialist. You will be notified of any new findings that will affect your care.  Call 911 Or Emergency Services  if any of the following occur:   Any of your stroke symptoms worsen   New problems with speech, vision, walking, or weakness or numbness on one side of your body   Severe headache, fainting spell, dizziness or seizure   Chest pain or shortness of breath   2000-2015 [employer] [address], [place] [address]. All rights reserved. This  information is not intended as a substitute for professional medical care. Always follow your healthcare professional's instructions.          Discharge Instructions for Stroke  You have been diagnosed with stroke. During a stroke, blood stops flowing to part of your brain. This can damage areas in the brain that control other parts of the body. Symptoms after a stroke depend on which part of the brain has been affected.  Stroke risk factors  Once you've had a stroke, you're at greater risk for another one. Listed below are some other factors that can increase your risk for another stroke:   High blood pressure   High cholesterol   Cigarette or cigar smoking   Diabetes   Carotid or other artery disease   Atrial fibrillation, atrial flutter,or other heart disease   Physical inactivity   Obesity   Certain blood disorders (such as sickle cell anemia)   Excessive alcohol use   Abuse of illegal drugs   Race   Gender   Family history of stroke   Diet high in salty, fried, or greasy foods  Changes in daily living  Doingyour regular tasks may be difficult after you've had a stroke, but you can learn new ways to manage your daily activities. In fact, doing daily activities may help you to regain muscle strength and bring back function to affected limbs. Be patient, give yourself time to adjust, and appreciate the progress you make.  Daily activities      You may be at risk of falling. Make changes to your home to help you walk more easily. A therapist will decide if you need an assistive device to walk safely.  You may need to see an occupational or physical therapist to learn new ways of doing things. For example, you may need to make adjustments when bathing or dressing:   Try the following tips for showering or bathing:   Test the water temperature with a hand or foot that was not affected by the stroke.   Use grab bars, a shower seat, a hand-held showerhead, and a long-handled brush.   Try the  following tips for dressing:   Dress while sitting, starting with the affected side or limb.   Wear shirts that pull easily over your head and pants or skirts with elastic waistbands.   Use zippers with loops attached to the pull tabs.  Lifestyle changes   Take your medications exactly as directed. Don't skip doses.   Your health care provider will give you information on dietary changes that you may need to make, based on your situation. Your provider may recommend that you see a registered dietitian for help with diet changes. Changes may include:   Reducing fat and cholesterol intake   Reducing sodium (salt) intake, especially if you have high blood pressure   Increasing your intake of fresh vegetables and fruits   Eating lean proteins, such as fish, poultry, and legumes (beans and peas) and eating less red meat and processed meats   Using low-fat dairy products   Using vegetable and nut oils in limited amounts   Limiting sweets and processed foods such as chips, cookies, and baked goods.   Begin an exercise program. Ask your doctor how to get started and how much activity you should try to get on a daily or weekly basis. You can benefit from simple activities such as walking or gardening.   Limit alcohol intake. Men should haveno more than 2 alcoholic drinks a day. Women should limit themselves to 1 alcoholic drink per day.   Know your cholesterol level. Follow your doctor's recommendations about how to keep cholesterol under control.   If you are a smoker, it is time to quit now. Enroll in a stop-smoking program to improve your chances of success. Ask your doctor about medications or other methods to help you quit.   Learn stress management techniques to help you deal with stress in your home and work life.  Follow-up care   Keep your medical appointments. Close follow-up is important to stroke rehabilitation and recovery.   Some medications require blood tests to check for progress or  problems. Keep follow-up appointments for any blood tests ordered by your doctors.     2000-2015 [employer] [address], [place] [address]. All rights reserved. This information is not intended as a substitute for professional medical care. Always follow your healthcare professional's instructions.

## 2015-03-23 NOTE — Discharge Summary -  Nursing (Signed)
The patient and sister's learning abilities have been assessed. All discharge instructions, prescriptions, medication side effects, follow up information were reviewed with patient. All questions were addressed. Patient and sister demonstrate understanding of disease process, treatment plan, medications and consequences of noncompliance. All questions and concerns were addressed. Patient transported to lobby in stable condition.

## 2015-03-23 NOTE — Progress Notes (Signed)
Progress Note    Date Time: 03/23/2015 8:53 AM  Patient Name: [name]  Attending Physician: Romelle Starcher, *      Assessment & Plan:   81 yr old male with DM II who had a fall and found to have old right basal ganglia ischemic infarct and a new small area of ischemic stroke in the left anterior pons   This is a very punctate area of restricted diffusion thus he will have a good prognosis for recovery as he feels back to baseline.   Carotid US reviewed with no significant stenosis   TTE reviewed with no intracardiac masses noted   Continue secondaty stroke prevention medications with ASA, statin, DM II control and BP control   Continue PT      Subjective:   Patient Seen and Examined. The notes from the last 24 hours were reviewed. No new symptoms today    Review of Systems:   No headache, eye, ear nose, throat problems; no coughing or wheezing or shortness of breath, No chest pain or orthopnea, no abdominal pain, nausea or vomiting      Physical Exam:   Blood pressure 120/62, pulse 73, temperature 97.3 F (36.3 C), temperature source Oral, resp. rate 16, height 1.702 m (5\' 7" ), weight 64.456 kg (142 lb 1.6 oz), SpO2 99 %.    HEENT: Normocephalic. No icter or congestion  Neck: supple  Extremities: no clubbing, cyanosis, or edema  Skin: no rashes or lesions noted    Neuro:  Level of consciousness:  Alert and appropriate  Oriented:  X 3  Facial Movements: symmetric  Strength:  No upper extremity drift  Sensation to light touch: Intact bilaterally      Meds:      Scheduled Meds: PRN Meds:      aspirin 324 mg Oral Daily   enoxaparin 40 mg Subcutaneous Daily   famotidine 20 mg Oral Q12H SCH   glimepiride 2 mg Oral QAM W/BREAKFAST   pioglitazone 45 mg Oral QAM   simvastatin 40 mg Oral QHS       Continuous Infusions:      acetaminophen 650 mg Q6H PRN   dextrose 25 mL PRN   glucagon (rDNA) 1 mg PRN   insulin regular 1-8 Units TID AC PRN           I personally reviewed all of the medications    Labs:      Recent Labs  Lab 03/21/15  1421   GLUCOSE 217*   BUN 18.0   CREATININE 1.0   CALCIUM 9.1   SODIUM 139   POTASSIUM 5.2*   CHLORIDE 107   CO2 24   ALBUMIN 3.3*   AST (SGOT) 23   ALT 13   BILIRUBIN, TOTAL 0.3   ALKALINE PHOSPHATASE 64       Recent Labs  Lab 03/21/15  1421   WBC 6.13   HGB 12.9*   HEMATOCRIT 39.1*   MCV 96.5   MCH 31.9   MCHC 33.0   PLATELETS 231         No results for input(s): PTT, PT, INR in the last 72 hours.       Radiology Results (24 Hour)     Procedure Component Value Units Date/Time    US Carotid Duplex Dopp Comp Bilateral [[id number]] Collected:  03/22/15 1716    Order Status:  Completed Updated:  03/22/15 1722    Narrative:      HISTORY: Smoking, diabetes, stroke.  TECHNIQUE: Duplex evaluation of the cerebral vasculature is performed  with gray scale color-flow and given Doppler technique from the  clavicles to the angle of the mandible. Velocities were used in  reference to NASCET criteria as a determinate of carotid diameter.    FINDINGS: Examination of both left and right carotid arteries  demonstrates minor, right-sided intimal thickening and left-sided  homogeneous appearing bifurcation plaque without significant stenosis.  There is minimal elevation of systolic velocities proximally within the  internal carotid artery. Stenosis, both morphologically and by velocity  criteria within the internal carotid arteries is less than 40%. The  distal vessels are patent. The external carotid arteries are patent.    Bilateral antegrade vertebral artery flow is present.      Impression:        1. Minor plaque right and left carotid bifurcations with less than 40%  stenosis of the internal carotid arteries bilaterally.  2. Bilateral antegrade vertebral artery flow.    Dimitrios  Papadouris, MD   03/22/2015 5:17 PM             All recent brain and spine imaging (MRI, CT) personally reviewed.    Chart reviewed    Case discussed with patient at bedside      Signed by: Jaquita Rector, MD  Spectralink:  [phone]       Answering Service: [phone]

## 2015-03-23 NOTE — Final Progress Note (DC Note for stay less than 48 (Signed)
Progress Notes    Date Time: 03/23/2015 1:11 PM  Patient Name: [facility]     Patient Active Problem List   Diagnosis   . Lacunar infarction     Assessment:    Small acute infarct left anterior pons   Old right basal ganglia infarct   Fall    Diabetes mellitus type 2   Back pain from fall  Plan:    Carotid Ultrasound, no significant blockage   2D echo, negative for any shunt   Lipid panel   Continue ASA, statin   Glycemic control and BP control   DVT prophylaxis   Discussed with patient in detail   Discussed with Dr. Shannan Harper, Neurology. No further workup required. Can be discharged home on ASA 325mg  daily and statin. Follow up with Neurology in 4 weeks.     Plan discussed with Patient/Family/Staff/Consultants involved.  Course in the Hospital:   Mr. Bair admitted with fall. Noted to have lacunar infarct on MRI. Patient worked up per neuro. Cleared for discharge home on ASA and Statin. Follow up with neurology in 4 weeks.   Subjective:   Left sided upper back pain   No other symptoms    Review of Systems:   Constitutional: Negative for fever, chills, weight loss, malaise/fatigue and diaphoresis.   HEENT: No headache, dizziness, nausea or  vomiting.   Respiratory: Denies cough, shortness of breath.   Cardiovascular: No Chest pain. No shortness of breath. Negative for palpitations.   Gastrointestinal: No abdominal pain, No nausea, vomiting, diarrhea or  constipation.   Genitourinary: No burning micturition.   Musculoskeletal: back pain   Neurological: Denies headache, no nausea.Negative for weakness.   Endo/Heme/Allergies: Does not bruise/bleed easily.  Medications:     Current Facility-Administered Medications   Medication Dose Route Frequency Provider Last Rate Last Dose   . acetaminophen (TYLENOL) tablet 650 mg  650 mg Oral Q6H PRN Chonte Ricke R, MD   650 mg at 03/22/15 1726   . aspirin chewable tablet 324 mg  324 mg Oral Daily Loetta Rough, MD   324 mg at 03/23/15 0955   . dextrose 50 %  bolus 25 mL  25 mL Intravenous PRN Loetta Rough, MD       . enoxaparin (LOVENOX) syringe 40 mg  40 mg Subcutaneous Daily Loetta Rough, MD   40 mg at 03/23/15 1011   . famotidine (PEPCID) tablet 20 mg  20 mg Oral Q12H [facility] Loetta Rough, MD   20 mg at 03/23/15 0955   . glimepiride (AMARYL) tablet 2 mg  2 mg Oral QAM W/BREAKFAST Geryl Dohn R, MD   2 mg at 03/23/15 0955   . glucagon (rDNA) (GLUCAGEN) injection 1 mg  1 mg Intramuscular PRN Loetta Rough, MD       . insulin regular (HumuLIN R,NovoLIN R) injection 1-8 Units  1-8 Units Subcutaneous TID AC PRN Loetta Rough, MD   1 Units at 03/22/15 1850   . pioglitazone (ACTOS) tablet 45 mg  45 mg Oral QAM Jourdain Guay R, MD   45 mg at 03/23/15 0955   . simvastatin (ZOCOR) tablet 40 mg  40 mg Oral QHS Jaquita Rector, MD   40 mg at 03/22/15 2111       Physical Exam:     Filed Vitals:    03/23/15 1130   BP: 132/62   Pulse: 87   Temp: 97.4 F (36.3 C)   Resp: 16   SpO2: 98%     Temp (24hrs), Avg:97.6  F (36.4 C), Min:97.2 F (36.2 C), Max:98.4 F (36.9 C)    Intake and Output Summary (Last 24 hours) at Date Time    Intake/Output Summary (Last 24 hours) at 03/23/15 1311  Last data filed at 03/23/15 0800   Gross per 24 hour   Intake   1100 ml   Output      0 ml   Net   1100 ml     General Appearance: No acute distress.  Head:  Normocephalic  Eyes:  No pallor, cyanosis, icterus, PERLA, EOM's intact.  Neck:  Supple, No JVD  Lungs:  Clear to auscultation bilaterally, no wheezes, rhonchi or rales.  Heart:  S1, S2 normal, no murmurs.  Abdomen:  Soft, non-tender, non-distended, positive bowel sounds.  Extremities:  No cyanosis, clubbing or edema.  Neurologic:  Alert and oriented x3, mood and affect normal. Cranial Nerves II-XII grossly intact.    Labs:     Results     Procedure Component Value Units Date/Time    Glucose Whole Blood - POCT [[id number]]  (Abnormal) Collected:  03/23/15 1125     POCT - Glucose Whole blood 304 (H) mg/dL Updated:  40/98/11 9147     Hemoglobin A1C [[id number]]  (Abnormal) Collected:  03/23/15 0435    Specimen Information:  Blood Updated:  03/23/15 0956     Hemoglobin A1C 6.1 (H) %     Lipid panel [[id number]]  (Abnormal) Collected:  03/23/15 0435    Specimen Information:  Blood Updated:  03/23/15 0927     Cholesterol 165 mg/dL      Triglycerides 75 mg/dL      HDL 38 (L) mg/dL      LDL Calculated 865 (H) mg/dL      VLDL Cholesterol Cal 15 mg/dL      CHOL/HDL Ratio 4.3     Hemolysis index [[id number]] Collected:  03/23/15 0435     Hemolysis Index 5 Updated:  03/23/15 0927    Glucose Whole Blood - POCT [[id number]]  (Abnormal) Collected:  03/23/15 0642     POCT - Glucose Whole blood 107 (H) mg/dL Updated:  08/27/24 3664    Glucose Whole Blood - POCT [[id number]]  (Abnormal) Collected:  03/22/15 2339     POCT - Glucose Whole blood 158 (H) mg/dL Updated:  56/38/75 6433    Glucose Whole Blood - POCT [[id number]] Collected:  03/22/15 2150     POCT - Glucose Whole blood 83 mg/dL Updated:  60/63/01 6010    Glucose Whole Blood - POCT [[id number]]  (Abnormal) Collected:  03/22/15 1549     POCT - Glucose Whole blood 191 (H) mg/dL Updated:  20/25/42 7062          Rads:     Radiology Results (24 Hour)     Procedure Component Value Units Date/Time    US Carotid Duplex Dopp Comp Bilateral [[id number]] Collected:  03/22/15 1716    Order Status:  Completed Updated:  03/22/15 1722    Narrative:      HISTORY: Smoking, diabetes, stroke.    TECHNIQUE: Duplex evaluation of the cerebral vasculature is performed  with gray scale color-flow and given Doppler technique from the  clavicles to the angle of the mandible. Velocities were used in  reference to NASCET criteria as a determinate of carotid diameter.    FINDINGS: Examination of both left and right carotid arteries  demonstrates minor, right-sided intimal thickening and left-sided  homogeneous appearing bifurcation plaque without significant stenosis.  There is minimal elevation of  systolic velocities proximally within  the  internal carotid artery. Stenosis, both morphologically and by velocity  criteria within the internal carotid arteries is less than 40%. The  distal vessels are patent. The external carotid arteries are patent.    Bilateral antegrade vertebral artery flow is present.      Impression:        1. Minor plaque right and left carotid bifurcations with less than 40%  stenosis of the internal carotid arteries bilaterally.  2. Bilateral antegrade vertebral artery flow.    Dimitrios  Papadouris, MD   03/22/2015 5:17 PM            Signed by: Lourena Simmonds Aleks Nawrot

## 2015-05-21 ENCOUNTER — Emergency Department
Admission: EM | Admit: 2015-05-21 | Discharge: 2015-05-21 | Disposition: A | Discharge status: Home / Self Care | Payer: Medicare (Managed Care) | Attending: Emergency Medicine | Admitting: Emergency Medicine

## 2015-05-21 ENCOUNTER — Emergency Department: Payer: Medicare (Managed Care)

## 2015-05-21 DIAGNOSIS — Z8673 Personal history of transient ischemic attack (TIA), and cerebral infarction without residual deficits: Secondary | ICD-10-CM | POA: Insufficient documentation

## 2015-05-21 DIAGNOSIS — F17210 Nicotine dependence, cigarettes, uncomplicated: Secondary | ICD-10-CM | POA: Insufficient documentation

## 2015-05-21 DIAGNOSIS — E119 Type 2 diabetes mellitus without complications: Secondary | ICD-10-CM | POA: Insufficient documentation

## 2015-05-21 DIAGNOSIS — R1013 Epigastric pain: Secondary | ICD-10-CM | POA: Insufficient documentation

## 2015-05-21 DIAGNOSIS — E860 Dehydration: Secondary | ICD-10-CM | POA: Insufficient documentation

## 2015-05-21 DIAGNOSIS — R42 Dizziness and giddiness: Secondary | ICD-10-CM | POA: Insufficient documentation

## 2015-05-21 DIAGNOSIS — E785 Hyperlipidemia, unspecified: Secondary | ICD-10-CM | POA: Insufficient documentation

## 2015-05-21 DIAGNOSIS — Z7982 Long term (current) use of aspirin: Secondary | ICD-10-CM | POA: Insufficient documentation

## 2015-05-21 LAB — URINALYSIS, REFLEX TO MICROSCOPIC EXAM IF INDICATED
Bilirubin, UA: NEGATIVE
Blood, UA: NEGATIVE
Glucose, UA: NEGATIVE
Ketones UA: NEGATIVE
Leukocyte Esterase, UA: NEGATIVE
Nitrite, UA: NEGATIVE
Protein, UR: 30 — AB
Specific Gravity UA: 1.019 (ref 1.001–1.035)
Urine pH: 6 (ref 5.0–8.0)
Urobilinogen, UA: NEGATIVE mg/dL

## 2015-05-21 LAB — CBC AND DIFFERENTIAL
Basophils Absolute Automated: 0.03 10*3/uL (ref 0.00–0.20)
Basophils Automated: 1 %
Eosinophils Absolute Automated: 0.24 10*3/uL (ref 0.00–0.70)
Eosinophils Automated: 5 %
Hematocrit: 34.6 % — ABNORMAL LOW (ref 42.0–52.0)
Hgb: 11.1 g/dL — ABNORMAL LOW (ref 13.0–17.0)
Immature Granulocytes Absolute: 0 10*3/uL
Immature Granulocytes: 0 %
Lymphocytes Absolute Automated: 0.88 10*3/uL (ref 0.50–4.40)
Lymphocytes Automated: 17 %
MCH: 31.3 pg (ref 28.0–32.0)
MCHC: 32.1 g/dL (ref 32.0–36.0)
MCV: 97.5 fL (ref 80.0–100.0)
MPV: 10 fL (ref 9.4–12.3)
Monocytes Absolute Automated: 0.42 10*3/uL (ref 0.00–1.20)
Monocytes: 8 %
Neutrophils Absolute: 3.52 10*3/uL (ref 1.80–8.10)
Neutrophils: 69 %
Nucleated RBC: 0 /100 WBC (ref 0–1)
Platelets: 300 10*3/uL (ref 140–400)
RBC: 3.55 10*6/uL — ABNORMAL LOW (ref 4.70–6.00)
RDW: 14 % (ref 12–15)
WBC: 5.09 10*3/uL (ref 3.50–10.80)

## 2015-05-21 LAB — HEMOLYSIS INDEX: Hemolysis Index: 24 — ABNORMAL HIGH (ref 0–18)

## 2015-05-21 LAB — COMPREHENSIVE METABOLIC PANEL
ALT: 8 U/L (ref 0–55)
AST (SGOT): 19 U/L (ref 5–34)
Albumin/Globulin Ratio: 0.9 (ref 0.9–2.2)
Albumin: 3.3 g/dL — ABNORMAL LOW (ref 3.5–5.0)
Alkaline Phosphatase: 97 U/L (ref 38–106)
Anion Gap: 10 (ref 5.0–15.0)
BUN: 13 mg/dL (ref 9.0–28.0)
Bilirubin, Total: 0.4 mg/dL (ref 0.2–1.2)
CO2: 22 mEq/L (ref 22–29)
Calcium: 9.1 mg/dL (ref 7.9–10.2)
Chloride: 107 mEq/L (ref 100–111)
Creatinine: 1.3 mg/dL (ref 0.7–1.3)
Globulin: 3.8 g/dL — ABNORMAL HIGH (ref 2.0–3.6)
Glucose: 136 mg/dL — ABNORMAL HIGH (ref 70–100)
Potassium: 4.6 mEq/L (ref 3.5–5.1)
Protein, Total: 7.1 g/dL (ref 6.0–8.3)
Sodium: 139 mEq/L (ref 136–145)

## 2015-05-21 LAB — TROPONIN I: Troponin I: 0.02 ng/mL (ref 0.00–0.09)

## 2015-05-21 LAB — GFR: EGFR: >60

## 2015-05-21 LAB — GLUCOSE WHOLE BLOOD - POCT: Whole Blood Glucose POCT: 125 mg/dL — ABNORMAL HIGH (ref 70–100)

## 2015-05-21 LAB — LACTIC ACID, PLASMA: Lactic Acid: 1.4 mmol/L (ref 0.2–2.0)

## 2015-05-21 MED ORDER — ALUM & MAG HYDROXIDE-SIMETH 200-200-20 MG/5ML PO SUSP
30.0000 mL | Freq: Once | ORAL | Status: AC
Start: 2015-05-21 — End: 2015-05-21
  Administered 2015-05-21: 30 mL via ORAL
  Filled 2015-05-21: qty 30

## 2015-05-21 MED ORDER — LIDOCAINE VISCOUS 2 % MT SOLN
10.0000 mL | Freq: Once | OROMUCOSAL | Status: AC
Start: 2015-05-21 — End: 2015-05-21
  Administered 2015-05-21: 10 mL via OROMUCOSAL
  Filled 2015-05-21: qty 15

## 2015-05-21 MED ORDER — PANTOPRAZOLE SODIUM 40 MG PO TBEC
40.0000 mg | DELAYED_RELEASE_TABLET | Freq: Every day | ORAL | Status: DC
Start: 2015-05-21 — End: 2015-07-01

## 2015-05-21 MED ORDER — FAMOTIDINE 10 MG/ML IV SOLN (WRAP)
20.0000 mg | Freq: Once | INTRAVENOUS | Status: AC
Start: 2015-05-21 — End: 2015-05-21
  Administered 2015-05-21: 20 mg via INTRAVENOUS
  Filled 2015-05-21: qty 2

## 2015-05-21 MED ORDER — SODIUM CHLORIDE 0.9 % IV BOLUS
1000.0000 mL | Freq: Once | INTRAVENOUS | Status: AC
Start: 2015-05-21 — End: 2015-05-21
  Administered 2015-05-21: 1000 mL via INTRAVENOUS

## 2015-05-21 NOTE — ED Notes (Signed)
Ct contacted.

## 2015-05-21 NOTE — ED Provider Notes (Signed)
EMERGENCY DEPARTMENT HISTORY AND PHYSICAL EXAM     Physician/Midlevel provider first contact with patient: 05/21/15 1228         Date: 05/21/2015  Patient Name: [name]    History of Presenting Illness     Chief Complaint   Patient presents with   . Fatigue   . Abdominal Pain       History Provided By: Pt    Chief Complaint: Generalized tiredness  Onset: 2 days  Timing: Constant  Location: Generalized  Severity: Moderate  Modifying Factors: None  Associated Symptoms: Epigastric abdominal pain, "upset stomach," and dizziness. No vomiting or diarrhea.     Additional History: [name] is a 81 y.o. male c/o generalized tiredness and an "upset stomach" with epigastric abdominal pain. Pt reports his abdomen "feels like it's burning." Pt reports dizziness for the last 3 weeks. Pt denies vomiting or diarrhea.       PCP: [name], MD      No current facility-administered medications for this encounter.     Current Outpatient Prescriptions   Medication Sig Dispense Refill   . aspirin 325 MG tablet Take 1 tablet (325 mg total) by mouth daily. 30 tablet 3   . brimonidine (ALPHAGAN P) 0.1 % Solution Place 1 drop into both eyes 2 (two) times daily.        Marland Kitchen glimepiride (AMARYL) 2 MG tablet Take 2 mg by mouth every morning before breakfast.     . metFORMIN (GLUCOPHAGE) 850 MG tablet Take 850 mg by mouth 2 (two) times daily with meals.     . pantoprazole (PROTONIX) 40 MG tablet Take 1 tablet (40 mg total) by mouth daily. 30 tablet 0   . pioglitazone (ACTOS) 45 MG tablet Take 45 mg by mouth daily.     . simvastatin (ZOCOR) 40 MG tablet Take 1 tablet (40 mg total) by mouth nightly. 30 tablet 0       Past History     Past Medical History:  Past Medical History   Diagnosis Date   . No diagnosis    . Diabetes mellitus    . Hyperlipidemia        Past Surgical History:  Past Surgical History   Procedure Laterality Date   . Appendectomy         Family History:  No family history on file.    Social History:  History    Substance Use Topics   . Smoking status: Current Some Day Smoker     Types: Cigarettes   . Smokeless tobacco: Not on file   . Alcohol Use: Yes       Allergies:  Allergies   Allergen Reactions   . Penicillins        Review of Systems     Review of Systems   Constitutional:        +generalized tiredness   Gastrointestinal: Positive for abdominal pain. Negative for vomiting and diarrhea.   Neurological: Positive for dizziness.   All other systems reviewed and are negative.         Physical Exam   BP 110/58 mmHg  Pulse 62  Temp(Src) 97.8 F (36.6 C) (Oral)  Resp 14  Ht 5\' 8"  (1.727 m)  Wt 70.308 kg  BMI 23.57 kg/m2  SpO2 98%    Physical Exam   Constitutional: Patient is oriented to person, place, and time and well-developed, well-nourished, and in no distress.   Head: Atraumatic.   Eyes: EOMI. PERRL.  No  nystagmus.   ENT:  MMM.  Neck:  FROM. No spinal tenderness. Neck supple.    Cardiovascular: Normal rate and regular rhythm.   Pulmonary/Chest: Effort normal and breath sounds normal. No respiratory distress.   Abdominal: Soft. There is epigastric tenderness only (none to RUQ). Bowel sounds present and normal.    Musculoskeletal:  No lower extremity edema or tenderness.    Neurological: Patient is alert and oriented to person, place, and time.  CN II-XII intact. Sensation intact throughout to light touch.  Power 5/5 all extremities. Romberg negaive. Pronator negative. Gait normal.   Skin: Skin is warm and dry.    Diagnostic Study Results     Labs -     Results     Procedure Component Value Units Date/Time    Troponin I [[id number]] Collected:  05/21/15 1254     Troponin I 0.02 ng/mL Updated:  05/21/15 1338    Comprehensive Metabolic Panel (CMP) [[id number]]  (Abnormal) Collected:  05/21/15 1254    Specimen Information:  Blood Updated:  05/21/15 1330     Glucose 136 (H) mg/dL      BUN 78.2 mg/dL      Creatinine 1.3 mg/dL      Sodium 956 mEq/L      Potassium 4.6 mEq/L      Chloride 107 mEq/L      CO2 22 mEq/L       Calcium 9.1 mg/dL      Protein, Total 7.1 g/dL      Albumin 3.3 (L) g/dL      AST (SGOT) 19 U/L      ALT 8 U/L      Alkaline Phosphatase 97 U/L      Bilirubin, Total 0.4 mg/dL      Globulin 3.8 (H) g/dL      Albumin/Globulin Ratio 0.9      Anion Gap 10.0     Hemolysis index [[id number]]  (Abnormal) Collected:  05/21/15 1254     Hemolysis Index 24 (H) Updated:  05/21/15 1330    GFR [[id number]] Collected:  05/21/15 1254     EGFR >60.0 Updated:  05/21/15 1330    UA, Reflex to Microscopic [[id number]]  (Abnormal) Collected:  05/21/15 1254    Specimen Information:  Urine Updated:  05/21/15 1326     Urine Type Clean Catch      Color, UA Yellow      Clarity, UA Clear      Specific Gravity UA 1.019      Urine pH 6.0      Leukocyte Esterase, UA Negative      Nitrite, UA Negative      Protein, UR 30 (A)      Glucose, UA Negative      Ketones UA Negative      Urobilinogen, UA Negative mg/dL      Bilirubin, UA Negative      Blood, UA Negative      RBC, UA 0 - 5 /hpf      WBC, UA 0 - 5 /hpf      Squamous Epithelial Cells, Urine 0 - 5 /hpf      Hyaline Casts, UA 11 - 25 (A) /lpf      Urine Mucus Present     Lactic Acid [[id number]] Collected:  05/21/15 1254    Specimen Information:  Blood Updated:  05/21/15 1317     Lactic acid 1.4 mmol/L     CBC and differential [[id number]]  (Abnormal) Collected:  05/21/15 1254  Specimen Information:  Blood from Blood Updated:  05/21/15 1317     WBC 5.09 x10 3/uL      Hgb 11.1 (L) g/dL      Hematocrit 62.1 (L) %      Platelets 300 x10 3/uL      RBC 3.55 (L) x10 6/uL      MCV 97.5 fL      MCH 31.3 pg      MCHC 32.1 g/dL      RDW 14 %      MPV 10.0 fL      Neutrophils 69 %      Lymphocytes Automated 17 %      Monocytes 8 %      Eosinophils Automated 5 %      Basophils Automated 1 %      Immature Granulocyte 0 %      Nucleated RBC 0 /100 WBC      Neutrophils Absolute 3.52 x10 3/uL      Abs Lymph Automated 0.88 x10 3/uL      Abs Mono Automated 0.42 x10 3/uL      Abs Eos Automated 0.24 x10 3/uL       Absolute Baso Automated 0.03 x10 3/uL      Absolute Immature Granulocyte 0.00 x10 3/uL     Glucose Whole Blood - POCT [[id number]]  (Abnormal) Collected:  05/21/15 1227     POCT - Glucose Whole blood 125 (H) mg/dL Updated:  96/29/52 8413          Radiologic Studies -   Radiology Results (24 Hour)     Procedure Component Value Units Date/Time    CT Head without Contrast [[id number]] Collected:  05/21/15 1454    Order Status:  Completed Updated:  05/21/15 1501    Narrative:      CT Head without contrast       Indication:    Dizziness    Comparison:  MRI brain 03/21/2015    Technique:  Axial CT images were obtained through the brain from the  skull base to the vertex without administration of IV contrast.  A  combination of automatic exposure control, adjustment of the mA and/or  kV    according to patient size and/or use of iterative reconstruction  technique was   utilized.    Findings:     Prominence of the ventricles and sulci is consistent with volume loss.   Gray-white matter differentiation is maintained. There is no  intracranial hemorrhage or extra-axial fluid collection. There is no  mass effect or midline shift. Mild periventricular white matter low  attenuation is non specific but likely secondary to chronic small vessel  ischemic disease. Chronic lacunar infarcts left basal ganglia    The included paranasal sinuses and mastoid air cells are clear.    No displaced skull fracture.      Impression:         No acute intracranial abnormality    Lorinda Creed, MD   05/21/2015 2:57 PM        .      Medical Decision Making   I am the first provider for this patient.    I reviewed the vital signs, available nursing notes, past medical history, past surgical history, family history and social history.    Vital Signs-Reviewed the patient's vital signs.     Patient Vitals for the past 12 hrs:   BP Temp Pulse Resp   05/21/15 1612 110/58 mmHg 97.8 F (36.6  C) 62 14   05/21/15 1538 - - 69 16   05/21/15 1209 103/53 mmHg 97 F  (36.1 C) 88 17       Pulse Oximetry Analysis -  Normal 98% on RA    Cardiac Monitor:  Rate: 84  Rhythm:  Normal Sinus Rhythm     EKG:  Interpreted by the EP.   Time Interpreted: 1205   Rate: 84   Rhythm: Normal Sinus Rhythm    Interpretation: No ST/T wave changes   Comparison: No change c/w 03/2015 EKG    Old Medical Records: Previous electrocardiograms. (See EKG interpretation above)  Nursing notes.     Smoking Cessation Counseling: The patient was counseled as to the multiple risks to his health from continued use of tobacco products.  It was explained that continuing to smoke may lead to multiple short and long term negative health consequences, including but not limited to mouth/esophageal/lung cancer, COPD, and heart disease.  He states he understands these risks, and also understands the options and resources available to him to help him stop smoking.  Nicotine replacement therapy, local hotlines, and local resources were discussed as viable options for helping him stop his tobacco use.  The total time spent counseling the patient regarding tobacco cessation was 5 minutes.      ED Course:     12:55 PM - WIll check labs, CT head, hydrate, GI cocktail and re-assess.     3:05 PM - Pt reports feeling improved after IVF and GI cocktail.     4:08 PM - Pt was made aware of all results and counseled on f/u plans with his PMD, medication use at home (will send home with Protonix) and all return precautions. All questions were answered and all concerns were addressed. Pt is stable, agrees with plan, and is amenable to discharge.      Provider Notes:  Dehydration w/orthostasis and epigastric burning for days.  Improved w/IVF.  EKG nml.  CE neg.  Start PPI.  F/U Dr. Selena Batten tomorrow.     Diagnosis     Clinical Impression:   1. Dizziness    2. Dehydration    3. Epigastric pain    4. History of CVA (cerebrovascular accident)        Disposition:   ED Disposition     Discharge [name] discharge to home/self  care.    Condition at disposition: Stable            _______________________________    Attestations:  This note is prepared by Latrelle Dodrill, acting as Scribe for Tenny Craw, MD.     Tenny Craw, MD:  The scribe's documentation has been prepared under my direction and personally reviewed by me in its entirety.  I confirm that the note above accurately reflects all work, treatment, procedures, and medical decision making performed by me.  _______________________________          Sharyne Richters, MD  05/21/15 [phone]

## 2015-05-21 NOTE — Discharge Instructions (Signed)
Thank you for choosing [facility] for your emergency care needs.   We strive to provide EXCELLENT care to you and your family.      If you do not continue to improve or your condition worsens, please contact your doctor or return immediately to the Emergency Department.    DOCTOR REFERRALS  Call [phone] if you need any further referrals and we can help you find a primary care doctor or specialist.  Also, available online at:  [email or web address]      FREE HEALTH SERVICES  If you need help with health or social services, please call 2-1-1 for a free referral to resources in your area.  2-1-1 is a free service connecting people with information on health insurance, free clinics, pregnancy, mental health, dental care, food assistance, housing, and substance abuse counseling.  Also, available online at:  [email or web address]    MEDICAL RECORDS AND TESTS  Certain laboratory test results do not come back the same day, for example urine cultures.   We will contact you if other important findings are noted.  Radiology films are often reviewed again to ensure accuracy.  If there is any discrepancy, we will notify you.      ORTHOPEDIC INJURY   Please know that significant injuries can exist even when an initial x-ray is read as normal or negative.  This can occur because some fractures (broken bones) are not initially visible on x-rays.  For this reason, close outpatient follow-up with your primary care doctor or bone specialist (orthopedist) is required.    MEDICATIONS AND FOLLOWUP  Please be aware that some prescription medications can cause drowsiness.  Use caution when driving or operating machinery.    BLOOD PRESSURE  If at any time during your visit, the measured blood pressure was greater than 120 systolic (top number) or 80 diastolic (bottom number), please see you primary care physician in 1-2 days.     The examination and treatment you have received in our Emergency  Department is provided on an emergency basis, and is not intended to be a substitute for your primary care physician.  It is important that your doctor checks you again and that you report any new or remaining problems at that time.        Abdominal Pain    You have been diagnosed with abdominal (belly) pain. The cause of your pain is not yet known.    Many things can cause abdominal pain. Examples include viral infections and bowel (intestine) spasms. You might need another examination or more tests to find out why you have pain.    At this time, your pain does not seem to be caused by anything dangerous. You do not need surgery. You do not need to stay in the hospital.     Though we don't believe your condition is dangerous right now, it is important to be careful. Sometimes a problem that seems mild can become serious later. This is why it is very important that you return here or go to the nearest Emergency Department unless you are 100% improved.    Return here or go to the nearest Emergency Department, or follow up with your physician in:   24 hours.    Drink only clear liquids such as water, clear broth, sports drinks, or clear caffeine-free soft drinks, like 7-Up or Sprite, for the next:   24 hours.    YOU SHOULD SEEK MEDICAL ATTENTION IMMEDIATELY, EITHER HERE OR AT THE NEAREST EMERGENCY DEPARTMENT,   IF ANY OF THE FOLLOWING OCCURS:   Your pain does not go away or gets worse.   You cannot keep fluids down or your vomit is dark green.    You vomit blood or see blood in your stool. Blood might be bright red or dark red. It can also be black and look like tar.   You have a fever (temperature higher than 100.37F / 38C) or shaking chills.   Your skin or eyes look yellow or your urine looks brown.   You have severe diarrhea.              Dehydration    You have been diagnosed with dehydration.    Dehydration is when your body is low on fluids (liquids). Dehydration has a variety of causes. These  range from vomiting and diarrhea, to excessive (a lot of) sweating and poor appetite.    You have received intravenous (IV) fluids. These are to help fix your dehydration. It is important to keep hydrating yourself at home. Drink plenty of fluids frequently. Drink fluids that won t upset your stomach. This includes water and juice. It also includes drinks like Gatorade. Stay away from beverages like soda pop and tea. They may make you worse. Avoid caffeine and alcohol. They may cause you to lose more fluid.    YOU SHOULD SEEK MEDICAL ATTENTION IMMEDIATELY, EITHER HERE OR AT THE NEAREST EMERGENCY DEPARTMENT, IF ANY OF THE FOLLOWING OCCURS:   Fever (temperature higher than 100.37F / 38C) or shaking chills.   Constant vomiting and/or diarrhea.   Lightheadedness or fainting.   If you do not urinate (pee) for 8 or more hours.              Dizziness, Nonspecific    You have been seen for dizziness.     Dizziness can mean different things to different people. Some people use dizziness to mean the feeling of spinning when there is no actual movement. This often causes nausea (feeling sick). The medical term for this is "vertigo." Others people use the word dizzy to mean "feeling lightheaded," like you might faint. This feeling is usually made better when lying down. For some people, neither of these describes how they are feeling. It can just be a feeling that makes you unsteady. This feeling is common in older people. It can be caused by a number of things. These include poor vision or hearing, foot problems and arthritis. It can also be caused by middle ear or sinus problems. The feeling can come and go.    Dizziness is also caused by more serious things. This includes strokes and heart problems.    It is NEVER normal to have the kind of dizziness you have today together with:   Chest pain.   Problems walking because of problems with balance. Especially if you are falling to one side.   Weakness, numbness or  tingling in a part of your body.   Drooping of one side of your face.   Confusion.   Severe headache.   Problems speaking.    If you have these symptoms, it is VERY IMPORTANT to go to the nearest emergency department.    Your tests today were negative (normal). This means we found no life-threatening causes for your dizziness. It is OK for you to go home.    See your primary care doctor for more work-up of your dizziness.     YOU SHOULD SEEK MEDICAL ATTENTION IMMEDIATELY, EITHER HERE OR AT  THE NEAREST EMERGENCY DEPARTMENT, IF ANY OF THE FOLLOWING OCCUR:   You cannot speak clearly (slurring), one side of your face droops or you feel weak in the arms or legs (especially on one side).   You have problems with your balance.   You have problems hearing or there is ringing or a feeling of fullness in your ear.   You lose consciousness ("pass out" or faint).   You have severe headache with dizziness.   You have fever (temperature higher than 100.1F / 38C).   You fall and hit your head.

## 2015-05-21 NOTE — ED Notes (Signed)
Ct not ready. Ct to gather Pt

## 2015-05-21 NOTE — ED Notes (Signed)
Chest pain for 2 days also reports abd pain with with nausea and difficulty having a bowel movement

## 2015-05-22 LAB — ECG 12-LEAD
Atrial Rate: 82 {beats}/min
P Axis: 63 degrees
P-R Interval: 160 ms
Q-T Interval: 358 ms
QRS Duration: 82 ms
QTC Calculation (Bezet): 418 ms
R Axis: 67 degrees
T Axis: 62 degrees
Ventricular Rate: 82 {beats}/min

## 2015-06-29 ENCOUNTER — Emergency Department: Payer: Medicare (Managed Care)

## 2015-06-29 ENCOUNTER — Observation Stay
Admission: EM | Admit: 2015-06-29 | Discharge: 2015-07-01 | DRG: 378 | Disposition: A | Discharge status: Home / Self Care | Payer: Medicare (Managed Care) | Attending: Internal Medicine | Admitting: Internal Medicine

## 2015-06-29 ENCOUNTER — Inpatient Hospital Stay: Payer: Medicare (Managed Care) | Admitting: Internal Medicine

## 2015-06-29 DIAGNOSIS — I959 Hypotension, unspecified: Secondary | ICD-10-CM | POA: Insufficient documentation

## 2015-06-29 DIAGNOSIS — D62 Acute posthemorrhagic anemia: Principal | ICD-10-CM | POA: Insufficient documentation

## 2015-06-29 DIAGNOSIS — H532 Diplopia: Secondary | ICD-10-CM | POA: Diagnosis present

## 2015-06-29 DIAGNOSIS — Z880 Allergy status to penicillin: Secondary | ICD-10-CM

## 2015-06-29 DIAGNOSIS — I10 Essential (primary) hypertension: Secondary | ICD-10-CM | POA: Diagnosis present

## 2015-06-29 DIAGNOSIS — D649 Anemia, unspecified: Secondary | ICD-10-CM

## 2015-06-29 DIAGNOSIS — R195 Other fecal abnormalities: Secondary | ICD-10-CM

## 2015-06-29 DIAGNOSIS — K921 Melena: Secondary | ICD-10-CM | POA: Diagnosis present

## 2015-06-29 DIAGNOSIS — K5900 Constipation, unspecified: Secondary | ICD-10-CM | POA: Diagnosis present

## 2015-06-29 DIAGNOSIS — D500 Iron deficiency anemia secondary to blood loss (chronic): Secondary | ICD-10-CM | POA: Insufficient documentation

## 2015-06-29 DIAGNOSIS — R42 Dizziness and giddiness: Secondary | ICD-10-CM

## 2015-06-29 DIAGNOSIS — Z7982 Long term (current) use of aspirin: Secondary | ICD-10-CM

## 2015-06-29 DIAGNOSIS — I69398 Other sequelae of cerebral infarction: Secondary | ICD-10-CM

## 2015-06-29 DIAGNOSIS — E119 Type 2 diabetes mellitus without complications: Secondary | ICD-10-CM | POA: Diagnosis present

## 2015-06-29 DIAGNOSIS — F17210 Nicotine dependence, cigarettes, uncomplicated: Secondary | ICD-10-CM | POA: Diagnosis present

## 2015-06-29 DIAGNOSIS — N289 Disorder of kidney and ureter, unspecified: Secondary | ICD-10-CM

## 2015-06-29 DIAGNOSIS — K2901 Acute gastritis with bleeding: Secondary | ICD-10-CM | POA: Insufficient documentation

## 2015-06-29 DIAGNOSIS — E782 Mixed hyperlipidemia: Secondary | ICD-10-CM | POA: Diagnosis present

## 2015-06-29 LAB — BASIC METABOLIC PANEL
Anion Gap: 10 (ref 5.0–15.0)
BUN: 14 mg/dL (ref 9.0–28.0)
CO2: 23 mEq/L (ref 22–29)
Calcium: 9.2 mg/dL (ref 7.9–10.2)
Chloride: 107 mEq/L (ref 100–111)
Creatinine: 1.6 mg/dL — ABNORMAL HIGH (ref 0.7–1.3)
Glucose: 204 mg/dL — ABNORMAL HIGH (ref 70–100)
Potassium: 4.4 mEq/L (ref 3.5–5.1)
Sodium: 140 mEq/L (ref 136–145)

## 2015-06-29 LAB — HEMOGLOBIN AND HEMATOCRIT, BLOOD
Hematocrit: 25.8 % — ABNORMAL LOW (ref 42.0–52.0)
Hgb: 8.2 g/dL — ABNORMAL LOW (ref 13.0–17.0)

## 2015-06-29 LAB — CBC AND DIFFERENTIAL
Basophils Absolute Automated: 0.03 10*3/uL (ref 0.00–0.20)
Basophils Automated: 1 %
Eosinophils Absolute Automated: 0.16 10*3/uL (ref 0.00–0.70)
Eosinophils Automated: 3 %
Hematocrit: 27.1 % — ABNORMAL LOW (ref 42.0–52.0)
Hgb: 8.5 g/dL — ABNORMAL LOW (ref 13.0–17.0)
Immature Granulocytes Absolute: 0.01 10*3/uL
Immature Granulocytes: 0 %
Lymphocytes Absolute Automated: 1.06 10*3/uL (ref 0.50–4.40)
Lymphocytes Automated: 20 %
MCH: 29.6 pg (ref 28.0–32.0)
MCHC: 31.4 g/dL — ABNORMAL LOW (ref 32.0–36.0)
MCV: 94.4 fL (ref 80.0–100.0)
MPV: 9.1 fL — ABNORMAL LOW (ref 9.4–12.3)
Monocytes Absolute Automated: 0.23 10*3/uL (ref 0.00–1.20)
Monocytes: 4 %
Neutrophils Absolute: 3.95 10*3/uL (ref 1.80–8.10)
Neutrophils: 73 %
Nucleated RBC: 0 /100 WBC (ref 0–1)
Platelets: 231 10*3/uL (ref 140–400)
RBC: 2.87 10*6/uL — ABNORMAL LOW (ref 4.70–6.00)
RDW: 14 % (ref 12–15)
WBC: 5.43 10*3/uL (ref 3.50–10.80)

## 2015-06-29 LAB — LACTIC ACID, PLASMA: Lactic Acid: 1.7 mmol/L (ref 0.2–2.0)

## 2015-06-29 LAB — URINALYSIS, REFLEX TO MICROSCOPIC EXAM IF INDICATED
Bilirubin, UA: NEGATIVE
Blood, UA: NEGATIVE
Glucose, UA: NEGATIVE
Ketones UA: NEGATIVE
Leukocyte Esterase, UA: NEGATIVE
Nitrite, UA: NEGATIVE
Protein, UR: 100 — AB
Specific Gravity UA: 1.019 (ref 1.001–1.035)
Urine pH: 6 (ref 5.0–8.0)
Urobilinogen, UA: NEGATIVE mg/dL

## 2015-06-29 LAB — TYPE AND SCREEN
AB Screen Gel: NEGATIVE
ABO Rh: A POS

## 2015-06-29 LAB — TROPONIN I: Troponin I: <0.01 ng/mL (ref 0.00–0.09)

## 2015-06-29 LAB — HEMOLYSIS INDEX: Hemolysis Index: 2 (ref 0–18)

## 2015-06-29 LAB — GLUCOSE WHOLE BLOOD - POCT: Whole Blood Glucose POCT: 105 mg/dL — ABNORMAL HIGH (ref 70–100)

## 2015-06-29 LAB — GFR: EGFR: 50.4

## 2015-06-29 MED ORDER — PANTOPRAZOLE SODIUM 40 MG IV SOLR
40.0000 mg | Freq: Once | INTRAVENOUS | Status: AC
Start: 2015-06-29 — End: 2015-06-29
  Administered 2015-06-29: 40 mg via INTRAVENOUS
  Filled 2015-06-29: qty 40

## 2015-06-29 MED ORDER — SODIUM CHLORIDE 0.9 % IV BOLUS
1000.0000 mL | Freq: Once | INTRAVENOUS | Status: AC
Start: 2015-06-29 — End: 2015-06-29
  Administered 2015-06-29: 1000 mL via INTRAVENOUS

## 2015-06-29 MED ORDER — SODIUM CHLORIDE 0.9 % IV SOLN
INTRAVENOUS | Status: DC
Start: 2015-06-29 — End: 2015-07-01

## 2015-06-29 MED ORDER — PANTOPRAZOLE SODIUM 40 MG IV SOLR
40.0000 mg | Freq: Two times a day (BID) | INTRAVENOUS | Status: DC
Start: 2015-06-29 — End: 2015-06-30
  Administered 2015-06-29 – 2015-06-30 (×2): 40 mg via INTRAVENOUS
  Filled 2015-06-29 (×2): qty 40

## 2015-06-29 MED ORDER — ACETAMINOPHEN 325 MG PO TABS
650.0000 mg | ORAL_TABLET | Freq: Four times a day (QID) | ORAL | Status: DC | PRN
Start: 2015-06-29 — End: 2015-07-01

## 2015-06-29 NOTE — ED Provider Notes (Signed)
EMERGENCY DEPARTMENT HISTORY AND PHYSICAL EXAM     Physician/Midlevel provider first contact with patient: 06/29/15 1246         Date: 06/29/2015  Patient Name: [name]    History of Presenting Illness     Chief Complaint   Patient presents with   . Dizziness   . Diarrhea   . Chest Pain     Due to language barrier, the appropriate medical translator or service was not utilized to ensure accurate information was obtained and communication was unimpeded throughout the emergency department visit because the patient and/or family verbalized in English that they understood everything that was discussed. The patient and/or family is aware that a translator is available if they have any questions or concerns.    History Provided By: Patient    Chief Complaint: Dizziness  Onset: Yesterday  Timing: Constant   Quality: Lightheadedness  Severity: Moderate  Modifying Factors: Exacerbated by standing and walking and improved with laying flat.  Associated Symptoms: Constipation, dark stool, chest pain, headache, SOB, double vision in left eye    Additional History: [name] is a 81 y.o. male presenting to the ED with constant dizziness which began yesterday and worsened this morning. He describes the dizziness as feeling lightheaded. Dizziness is exacerbated by standing and walking, and improved with laying flat. Pt reports constipation which began 1 week ago. States his last BM was hard and "very dark". He reports constant chest pain which began 12 PM today at rest. Chest pain resolved in the ED. He notes an intermittent headache which began this morning and SOB with exertion which began 1 week ago. Pt reports intermittent double vision in the left eye, which he has had since his CVA, 3 months ago. Pt did not f/u with a neurologist after the CVA. Pt came in the ED hypotensive at 86/44. He does not monitor his BP at home. Pt took his HTN medications and took an Aspirin today. Pt states he is moving all his  extremities normally. Pt reports eating and drinking normally. Pt denies fever, cough, abdominal pain, vomiting, diarrhea, blood in stool or burning with urination.  Pt lives alone. He has never had a colonoscopy done before.     PCP: [name], MD      Current Facility-Administered Medications   Medication Dose Route Frequency Provider Last Rate Last Dose   . 0.9%  NaCl infusion   Intravenous Continuous Valente David, MD 100 mL/hr at 06/29/15 1601     . acetaminophen (TYLENOL) tablet 650 mg  650 mg Oral Q6H PRN Valente David, MD       . pantoprazole (PROTONIX) injection 40 mg  40 mg Intravenous BID Valente David, MD           Past History     Past Medical History:  Past Medical History   Diagnosis Date   . No diagnosis    . Diabetes mellitus    . Hyperlipidemia    . Cerebrovascular accident      03/2015       Past Surgical History:  Past Surgical History   Procedure Laterality Date   . Appendectomy         Family History:  History reviewed. No pertinent family history.    Social History:  Social History   Substance Use Topics   . Smoking status: Current Some Day Smoker     Types: Cigarettes   . Smokeless tobacco: Not on file   . Alcohol Use:  Yes       Allergies:  Allergies   Allergen Reactions   . Penicillins        Review of Systems     Review of Systems   Constitutional: Negative for fever and diaphoresis.   HENT: Negative for drooling, hearing loss and trouble swallowing.    Eyes: Positive for visual disturbance. Negative for discharge and redness.        + intermittent double vision in left eye   Respiratory: Positive for shortness of breath (on exertion). Negative for cough and wheezing.    Cardiovascular: Positive for chest pain (resolved). Negative for leg swelling.   Gastrointestinal: Positive for constipation. Negative for vomiting, abdominal pain, diarrhea and blood in stool.        + dark stool   Genitourinary: Negative for dysuria and difficulty urinating.   Musculoskeletal:  Negative for back pain and gait problem.   Skin: Negative for rash.   Neurological: Positive for dizziness, light-headedness and headaches. Negative for speech difficulty.   Psychiatric/Behavioral: Negative for suicidal ideas and confusion.         Physical Exam   BP 112/49 mmHg  Pulse 79  Temp(Src) 97.3 F (36.3 C) (Oral)  Resp 18  Ht 5\' 9"  (1.753 m)  Wt 67.813 kg  BMI 22.07 kg/m2  SpO2 99%    Physical Exam   Constitutional: He is oriented to person, place, and time. He appears well-developed and well-nourished. No distress.   HENT:   Head: Normocephalic and atraumatic.   Mouth/Throat: Mucous membranes are dry. No trismus in the jaw.   Eyes: Conjunctivae and EOM are normal. Right eye exhibits no discharge. Left eye exhibits no discharge. No scleral icterus.   Cloudy R cornea. Pt unable to see out of R eye, old problem   Neck: No JVD present. No tracheal deviation present.   Cardiovascular: Normal rate, regular rhythm, normal heart sounds and intact distal pulses.    No murmur heard.  1+ L radial pulse     Pulmonary/Chest: Effort normal and breath sounds normal. No stridor. No respiratory distress. He has no wheezes.   Abdominal: Soft. He exhibits no distension and no mass. There is no tenderness. There is no rebound and no guarding.   Genitourinary: Guaiac positive stool (yellow/brown stool + heme).   Musculoskeletal: Normal range of motion. He exhibits no edema or tenderness.   Neurological: He is alert and oriented to person, place, and time. No cranial nerve deficit. He exhibits normal muscle tone. Coordination normal.   5/5 strength x 4 ext, sensation intact   Skin: Skin is warm and dry. No rash noted. He is not diaphoretic.   Psychiatric: He has a normal mood and affect. Judgment normal.   Nursing note and vitals reviewed.        Diagnostic Study Results     Labs -     Results     Procedure Component Value Units Date/Time    Hemoglobin and hematocrit, blood (Q6H) [[id number]]  (Abnormal) Collected:   06/29/15 1734    Specimen Information:  Blood Updated:  06/29/15 1825     Hgb 8.2 (L) g/dL      Hematocrit 40.9 (L) %     Blood Culture Aerobic and Anaerobic [[id number]] Collected:  06/29/15 1249    Specimen Information:  Blood from Blood Updated:  06/29/15 1555    Narrative:      1 BLUE+1 PURPLE    Type and Screen [[id number]] Collected:  06/29/15 1353  Specimen Information:  Blood Updated:  06/29/15 1442     ABO Rh A POS      AB Screen Gel NEG     UA, Reflex to Microscopic (pts 3 + yrs) [[id number]]  (Abnormal) Collected:  06/29/15 1249    Specimen Information:  Urine Updated:  06/29/15 1337     Urine Type Clean Catch      Color, UA Amber (A)      Clarity, UA Hazy      Specific Gravity UA 1.019      Urine pH 6.0      Leukocyte Esterase, UA Negative      Nitrite, UA Negative      Protein, UR 100 (A)      Glucose, UA Negative      Ketones UA Negative      Urobilinogen, UA Negative mg/dL      Bilirubin, UA Negative      Blood, UA Negative      RBC, UA 0 - 5 /hpf      WBC, UA 0 - 5 /hpf      Squamous Epithelial Cells, Urine 0 - 5 /hpf      Hyaline Casts, UA TNTC (A) /lpf      Urine Mucus Present     Troponin I [[id number]] Collected:  06/29/15 1249    Specimen Information:  Blood Updated:  06/29/15 1320     Troponin I <0.01 ng/mL     GFR [[id number]] Collected:  06/29/15 1249     EGFR 50.4 Updated:  06/29/15 1312    Basic Metabolic Panel [[id number]]  (Abnormal) Collected:  06/29/15 1249    Specimen Information:  Blood Updated:  06/29/15 1312     Glucose 204 (H) mg/dL      BUN 40.1 mg/dL      Creatinine 1.6 (H) mg/dL      Calcium 9.2 mg/dL      Sodium 027 mEq/L      Potassium 4.4 mEq/L      Chloride 107 mEq/L      CO2 23 mEq/L      Anion Gap 10.0     Hemolysis index [[id number]] Collected:  06/29/15 1249     Hemolysis Index 2 Updated:  06/29/15 1312    CBC with differential [[id number]]  (Abnormal) Collected:  06/29/15 1249    Specimen Information:  Blood from Blood Updated:  06/29/15 1256     WBC 5.43 x10 3/uL      Hgb  8.5 (L) g/dL      Hematocrit 87.5 (L) %      Platelets 231 x10 3/uL      RBC 2.87 (L) x10 6/uL      MCV 94.4 fL      MCH 29.6 pg      MCHC 31.4 (L) g/dL      RDW 14 %      MPV 9.1 (L) fL      Neutrophils 73 %      Lymphocytes Automated 20 %      Monocytes 4 %      Eosinophils Automated 3 %      Basophils Automated 1 %      Immature Granulocyte 0 %      Nucleated RBC 0 /100 WBC      Neutrophils Absolute 3.95 x10 3/uL      Abs Lymph Automated 1.06 x10 3/uL      Abs Mono Automated 0.23 x10 3/uL  Abs Eos Automated 0.16 x10 3/uL      Absolute Baso Automated 0.03 x10 3/uL      Absolute Immature Granulocyte 0.01 x10 3/uL     Lactic acid [[id number]] Collected:  06/29/15 1249    Specimen Information:  Blood Updated:  06/29/15 1256     Lactic acid 1.7 mmol/L           Radiologic Studies -   Radiology Results (24 Hour)     Procedure Component Value Units Date/Time    XR Chest  AP Portable [[id number]] Collected:  06/29/15 1320    Order Status:  Completed Updated:  06/29/15 1324    Narrative:      HISTORY::Substernal chest pain    TECHNIQUE: An AP portable chest performed at 1305 hours    FINDINGS: The lungs are clear of acute focal infiltrates.  The heart,  mediastinum and bony thorax are unremarkable for age and AP technique.      Impression:       No radiographic evidence for acute cardiopulmonary disease.    Lorenda Peck, MD   06/29/2015 1:20 PM        .    Medical Decision Making   I am the first provider for this patient.    I reviewed the vital signs, available nursing notes, past medical history, past surgical history, family history and social history.    Vital Signs-Reviewed the patient's vital signs.     Patient Vitals for the past 12 hrs:   BP Temp Pulse Resp   06/29/15 2013 112/49 mmHg 97.3 F (36.3 C) 79 18   06/29/15 1915 113/51 mmHg (!) 95.8 F (35.4 C) 76 18   06/29/15 1633 106/53 mmHg (!) 95.9 F (35.5 C) 69 16   06/29/15 1441 106/49 mmHg - 79 -   06/29/15 1418 95/48 mmHg - 69 -   06/29/15 1400 105/52  mmHg - 67 18   06/29/15 1330 108/54 mmHg - 68 14   06/29/15 1300 110/58 mmHg - 74 18   06/29/15 1252 93/50 mmHg - - -   06/29/15 1239 (!) 86/44 mmHg (!) 96.3 F (35.7 C) 76 18       Pulse Oximetry Analysis - Normal 100% on RA.    Cardiac Monitor:  Rate: 74  Rhythm:  Normal Sinus Rhythm     EKG:  Interpreted by the EP.   Time Interpreted: 12:47p   Rate: 74   Rhythm: Normal Sinus Rhythm    Interpretation: LAE, TWI V1, elevated J point V2, V4-6, inf leads, I   Comparison: 05/21/15, but no elevated J point    Procedures:    Core Measures:  12 lead EKG completed and Aspirin not given as he has heme positive stool.    Critical Care Time:     Old Medical Records: Old Medical Records: Hemoglobin on 07/26 was 11.1 and is much lower today. Creatinine on 07/20 is 1.3 and is higher today in comparison. Pt was seen 07/20 for fatigue and epigastric abdominal pain. Pt's BP at the time was 110/58.   Previous electrocardiograms.  Nursing notes.       ED Course:   12:46 PM - Discussed plan for UA, CXR, IVF and blood work. Pt agrees w/ plan    1:44 PM - Discussed test results with pt. Informed him his hemoglobin is 8.5. Pt states he has not been diagnosed with anemia. Given that pt c/o of dark stool and his low RBC, we will proceed with  a rectal exam. Stool in heme positive. Pt states he has never had a colonoscopy. Protonix ordered.    1:55 PM - Discussed pt case with Joy (on call GI NP) who agrees with plan and will let the attending know.     1:57 PM - Left a message with [facility].     2:00 PM - Discussed pt case with [facility]) who accepts pt for hospitalization under Dr. Graciela [facility], inpatient).    2:26 PM - Updated pt on test results and plan for hospitalization. He is in agreement. Pt is a full code.     Provider Notes: lightheadedness since yesterday, worse w/ standing. Intermittent double vision since stroke. 1 week of SOB w/ walking. + constipation x 1 week. Constant CP started at 12p, resolved upon arrival here. Pt  doesn't fully understand my questions. When I asked him how his vision was, he said bad, couldn't elaborate more. I offered an interpreter, he asked if I spoke [place], [place], [place]. He refused interpreter. Hypotensive here. Labs, CXR, IVF, reassess    Diagnosis     Clinical Impression:   1. Lightheadedness    2. Normocytic anemia    3. Renal insufficiency    4. Heme positive stool        Treatment Plan:   ED Disposition     Admit Admitting Physician: Gean Quint [[id number]]  Diagnosis: Lightheadedness [865784]  Estimated Length of Stay: > or = to 2 midnights  Tentative Discharge Plan?: Home or Self Care [1]  Patient Class: Inpatient [101]              Attestations:   This note is prepared by Illene Bolus Alemu acting as Scribe for Carles Collet, MD.    Carles Collet, MD. The scribe's documentation has been prepared under my direction and personally reviewed by me in its entirety. I confirm that the note above accurately reflects all work, treatment, procedures, and medical decision making performed by me.            Annett Fabian, MD  07/03/15 [phone]

## 2015-06-29 NOTE — Plan of Care (Signed)
Problem: Health Promotion  Goal: Knowledge - disease process  Extent of understanding conveyed about a specific disease process.   Outcome: Progressing    Problem: Safety  Goal: Patient will be free from injury during hospitalization  Outcome: Progressing  Pt educated on fall precautions with understanding. Pt is able to ambulate with 1 assist. Hourly rounding continues, encouraged to call for assistance as needed. Pt will remain free from falls. Callbell within reach at all times. Will continue to monitor.        Problem: Psychosocial and Spiritual Needs  Goal: Demonstrates ability to cope with hospitalization/illness  Outcome: Progressing    Problem: Moderate/High Fall Risk Score >5  Goal: Patient will remain free of falls  Outcome: Progressing    Problem: GI Bleed  Goal: Fluid and electrolyte balance are achieved/maintained  Outcome: Progressing  Goal: No Bleeding  Outcome: Progressing

## 2015-06-29 NOTE — H&P (Signed)
Patient Type: I     ATTENDING PHYSICIAN: Gean Quint, MD     CHIEF COMPLAINT:  Dizziness and lightheadedness of a few days' duration.       HISTORY OF PRESENT ILLNESS:  Mr. [name] is an 81 year old [place] American man with past medical  history remarkable for diabetes mellitus, hyperlipidemia, recurrent  cerebrovascular accident in May of 2016, who presented to [facility] with dizziness and unsteadiness as well as lightheadedness.  The  patient has not been feeling well since his stroke.  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  Since his  stroke in May he has not been back to his baseline.  He has been feeling  dizzy and lightheaded Over the last few days.  For this reason, he came to  the emergency room.  He was found to be anemic and stool was positive for  guaiac.  He admitted that he has been having dark stools for the last few  days.  He claims that he has been having dark stools since his discharge  from the hospital last time.  He has constipation and goes to the bathroom  every 3 to 4 days.  Here, the patient was found to be hypotensive.  He was  hydrated with intravenous fluids.  He has been taking aspirin for stroke  since his admission last time.  Here, his hemoglobin was found to be 8.5  and his last hemoglobin from July was 11.1.  The patient will be admitted  for further evaluation of her GI bleed.     PAST MEDICAL HISTORY:  1.  Hyperlipidemia.  2.  Cerebrovascular accident.  3.  Diabetes mellitus.     PAST SURGICAL HISTORY:  Appendectomy.     FAMILY HISTORY:  Reviewed and unremarkable.     SOCIAL HISTORY:  Patient drinks on occasion.  He also smokes on occasion.     ALLERGIES:  PENICILLIN.     REVIEW OF SYSTEMS:  A 10-point review of systems was obtained and except as mentioned in  history of present illness was negative.  Positives include  lightheadedness, generalized weakness, shortness of breath, constipation,  dizziness, and  headache.     PHYSICAL EXAMINATION:  GENERAL:  Patient appears younger than his stated age.  VITAL SIGNS:  Blood pressure was 106/49, pulse 79, temperature 96.3,  respiratory rate 18, oxygen saturation 100% on room air.  HEENT:  Pale conjunctivae.  Anicteric sclerae.  Pupils are round, equal,  and reactive.  Moist mucous membranes, clear throat.  NECK:  No lymphadenopathy, no bruit.  CARDIOVASCULAR:  S1, S2 well heard, regular rate and rhythm.  CHEST:  Clear to auscultation.  ABDOMEN:  Soft, nontender, no organomegaly, active bowel sounds.  EXTREMITIES:  No edema or deformity.  NEUROLOGIC:  Alert and oriented with no focal neurological deficits.     LABORATORY DATA:  Initial labs showed a white count of 5400; hemoglobin of 8.5; platelet  count of 231,000.  Sodium was 140, potassium 4.4, chloride 107, bicarbonate  23, BUN 14, creatinine 1.6 and glucose of 204.  Chest x-ray showed no  radiographic evidence of acute cardiopulmonary disease.     ASSESSMENT:  1.  Symptomatic anemia.  2.  Gastrointestinal bleeding, most probably upper gastrointestinal.  3.  History of cerebrovascular accident, on aspirin.  4.  Hyperlipidemia.  5.  Diabetes mellitus.     PLAN:  1.  The patient will be admitted  to the medical floor with telemetry.  2.  We will do serial hemoglobin and hematocrit.    3.  We will put him on intravenous Protonix.  4.  We will hold on aspirin for now.  5.  We will consult GI for possible EGD and/or colonoscopy.  6.  We will keep him n.p.o. after midnight.  7.  We will hydrate him overnight with intravenous fluids.  8.  We will use SCDs for DVT prophylaxis.  9.  Further recommendations as we follow patient in the hospital.           D:  06/29/2015 19:01 PM by Dr. Annice Needy A. Benay Pike, MD ([id number])  T:  06/29/2015 19:57 PM by NTS      (Conf: [id number]) (Doc ID: [id number])

## 2015-06-29 NOTE — Progress Notes (Signed)
Pt admitted to room 2518A from the ED with dizziness. Pt is alert and oriented x 4. 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 Pt denies chest pain at this time. Pt denies nausea. No Edema noted. Strong pulses noted bilaterally. Left side just a bit weaker then right side from CVA 3 months ago. Pt lives alone. States that he has occasional SOB, sats 100% on RA. Started on NS at 100. GI Consult placed. Pt to be NPO at [facility] for possible GI studies. Positive hemoccult in ED. Pt denies diarrhea. Orthostatics done in ED were negative. Will check H+H q 6 hours. Bed alarm on for safety.

## 2015-06-29 NOTE — ED Notes (Addendum)
[name] is a 81 y.o. male from home c/o dizziness, blurred vision, Chest pain upon ambulation this morning, now resolved. Pt also has diarrhea x 3 days Hx CVA, 3 months ago; DM2. Hypotension at triage 86/44. BP 110/58 mmHg  Pulse 74  Temp(Src) 96.3 F (35.7 C) (Oral)  Resp 18  Ht 5\' 6"  (1.676 m)  Wt 67.813 kg  BMI 24.14 kg/m2  SpO2 100%

## 2015-06-30 ENCOUNTER — Encounter
Admission: EM | Disposition: A | Discharge status: Home / Self Care | Payer: Self-pay | Source: Home / Self Care | Attending: Emergency Medicine

## 2015-06-30 ENCOUNTER — Ambulatory Visit: Payer: Self-pay

## 2015-06-30 ENCOUNTER — Inpatient Hospital Stay: Payer: Medicare (Managed Care)

## 2015-06-30 DIAGNOSIS — E782 Mixed hyperlipidemia: Secondary | ICD-10-CM | POA: Diagnosis present

## 2015-06-30 DIAGNOSIS — D649 Anemia, unspecified: Secondary | ICD-10-CM | POA: Diagnosis present

## 2015-06-30 DIAGNOSIS — D62 Acute posthemorrhagic anemia: Secondary | ICD-10-CM | POA: Diagnosis present

## 2015-06-30 DIAGNOSIS — K921 Melena: Secondary | ICD-10-CM | POA: Diagnosis present

## 2015-06-30 DIAGNOSIS — E119 Type 2 diabetes mellitus without complications: Secondary | ICD-10-CM | POA: Diagnosis present

## 2015-06-30 DIAGNOSIS — K2980 Duodenitis without bleeding: Secondary | ICD-10-CM

## 2015-06-30 HISTORY — PX: EGD, BIOPSY: SHX3796

## 2015-06-30 LAB — HEMOGLOBIN AND HEMATOCRIT, BLOOD
Hematocrit: 21.8 % — ABNORMAL LOW (ref 42.0–52.0)
Hematocrit: 26.1 % — ABNORMAL LOW (ref 42.0–52.0)
Hgb: 6.7 g/dL — ABNORMAL LOW (ref 13.0–17.0)
Hgb: 8.5 g/dL — ABNORMAL LOW (ref 13.0–17.0)

## 2015-06-30 LAB — COMPREHENSIVE METABOLIC PANEL
ALT: 12 U/L (ref 0–55)
AST (SGOT): 15 U/L (ref 5–34)
Albumin/Globulin Ratio: 1 (ref 0.9–2.2)
Albumin: 2.8 g/dL — ABNORMAL LOW (ref 3.5–5.0)
Alkaline Phosphatase: 58 U/L (ref 38–106)
Anion Gap: 5 (ref 5.0–15.0)
BUN: 14 mg/dL (ref 9.0–28.0)
Bilirubin, Total: 0.8 mg/dL (ref 0.2–1.2)
CO2: 23 mEq/L (ref 22–29)
Calcium: 8 mg/dL (ref 7.9–10.2)
Chloride: 111 mEq/L (ref 100–111)
Creatinine: 0.9 mg/dL (ref 0.7–1.3)
Globulin: 2.7 g/dL (ref 2.0–3.6)
Glucose: 65 mg/dL — ABNORMAL LOW (ref 70–100)
Potassium: 4.1 mEq/L (ref 3.5–5.1)
Protein, Total: 5.5 g/dL — ABNORMAL LOW (ref 6.0–8.3)
Sodium: 139 mEq/L (ref 136–145)

## 2015-06-30 LAB — ECG 12-LEAD
Atrial Rate: 74 {beats}/min
P Axis: 52 degrees
P-R Interval: 172 ms
Q-T Interval: 380 ms
QRS Duration: 84 ms
QTC Calculation (Bezet): 421 ms
R Axis: 53 degrees
T Axis: 53 degrees
Ventricular Rate: 74 {beats}/min

## 2015-06-30 LAB — PREPARE RBC
Expiration Date: 201608302359
Status: TRANSFUSED
UTYPE: A POS

## 2015-06-30 LAB — GFR: EGFR: >60

## 2015-06-30 LAB — HEMOLYSIS INDEX: Hemolysis Index: 9 (ref 0–18)

## 2015-06-30 SURGERY — ESOPHAGOGASTRODUODENOSCOPY (EGD), BIOPSY
Anesthesia: Anesthesia General | Site: Abdomen | Wound class: Clean Contaminated

## 2015-06-30 MED ORDER — PROPOFOL INFUSION 10 MG/ML
INTRAVENOUS | Status: DC | PRN
Start: 2015-06-30 — End: 2015-06-30
  Administered 2015-06-30: 30 mg via INTRAVENOUS
  Administered 2015-06-30: 50 mg via INTRAVENOUS
  Administered 2015-06-30 (×2): 30 mg via INTRAVENOUS

## 2015-06-30 MED ORDER — LACTATED RINGERS IV SOLN
INTRAVENOUS | Status: DC
Start: 2015-06-30 — End: 2015-07-01

## 2015-06-30 MED ORDER — LIDOCAINE HCL 2 % IJ SOLN
INTRAMUSCULAR | Status: DC | PRN
Start: 2015-06-30 — End: 2015-06-30
  Administered 2015-06-30: 100 mg

## 2015-06-30 MED ORDER — LIDOCAINE HCL (PF) 2 % IJ SOLN
INTRAMUSCULAR | Status: AC
Start: 2015-06-30 — End: ?
  Filled 2015-06-30: qty 5

## 2015-06-30 MED ORDER — SODIUM CHLORIDE 0.9 % IV SOLN
INTRAVENOUS | Status: DC | PRN
Start: 2015-06-30 — End: 2015-07-01

## 2015-06-30 MED ORDER — PANTOPRAZOLE SODIUM 40 MG PO TBEC
40.0000 mg | DELAYED_RELEASE_TABLET | Freq: Two times a day (BID) | ORAL | Status: DC
Start: 2015-06-30 — End: 2015-07-01
  Administered 2015-06-30 – 2015-07-01 (×2): 40 mg via ORAL
  Filled 2015-06-30 (×2): qty 1

## 2015-06-30 MED ORDER — PROPOFOL 10 MG/ML IV EMUL (WRAP)
INTRAVENOUS | Status: AC
Start: 2015-06-30 — End: ?
  Filled 2015-06-30: qty 40

## 2015-06-30 MED ORDER — EPHEDRINE SULFATE 50 MG/ML IJ SOLN
INTRAMUSCULAR | Status: AC
Start: 2015-06-30 — End: ?
  Filled 2015-06-30: qty 1

## 2015-06-30 MED ORDER — SODIUM CHLORIDE 0.9 % IJ SOLN
INTRAMUSCULAR | Status: AC
Start: 2015-06-30 — End: ?
  Filled 2015-06-30: qty 10

## 2015-06-30 SURGICAL SUPPLY — 21 items
CANISTER 1000CC (Procedure Accessories) ×2 IMPLANT
FORCEPS BIOPSY L240 CM MICROMESH TEETH STREAMLINE CATHETER NEEDLE (Instrument) IMPLANT
FORCEPS BIOPSY L240 CM STANDARD CAPACITY (Instrument) ×1 IMPLANT
FORCEPS BX STD CPC RJ 4 2.2MM 240CM STRL (Instrument) ×1
GOWN ISO YELLOW UNIVERSAL (Gown) ×4 IMPLANT
KIT CARRY ON ENDO PROCEDURE (Kits) ×2 IMPLANT
KIT CARRY ON ENDO PROCEDURE CPK4557000001 (Kits) IMPLANT
KIT UNIVERSAL IRRIGATION SOL (Kits) ×2 IMPLANT
SOFT-CUF 2T ADULT SUB-MIN (Cuff) ×2 IMPLANT
SOLN LUBRICATING JELLY 4.25OZ (Irrigation Solutions) ×2 IMPLANT
SPONGE GAUZE L4 IN X W4 IN 16 PLY (Dressing) ×10 IMPLANT
SPONGE GAUZE L4 IN X W4 IN 16 PLY MAXIMUM ABSORBENT USP TYPE VII (Dressing) ×10 IMPLANT
SPONGE GZE CTTN CRTY 4X4IN LF NS 16 PLY (Dressing) ×10
SYRINGE 50 ML GRADUATE NONPYROGENIC DEHP (Syringes, Needles) ×1 IMPLANT
SYRINGE 50 ML GRADUATE NONPYROGENIC DEHP FREE PVC FREE BD MEDICAL (Syringes, Needles) ×1 IMPLANT
SYRINGE MED 50ML LF STRL GRAD N-PYRG (Syringes, Needles) ×1
WATER STERILE PLASTIC POUR BOTTLE 1000 (Irrigation Solutions) ×1 IMPLANT
WATER STERILE PLASTIC POUR BOTTLE 1000 ML (Irrigation Solutions) ×1 IMPLANT
WATER STERILE PLASTIC POUR BOTTLE 250 ML (Irrigation Solutions) ×1 IMPLANT
WATER STRL 1000ML LF PLS PR BTL (Irrigation Solutions) ×1
WATER STRL 250ML LF PLS PR BTL (Irrigation Solutions) ×1

## 2015-06-30 NOTE — Anesthesia Preprocedure Evaluation (Addendum)
Anesthesia Evaluation    AIRWAY    Mallampati: II    TM distance: >3 FB  Neck ROM: full  Mouth Opening:full   CARDIOVASCULAR    regular and normal       DENTAL    no notable dental hx     PULMONARY    clear to auscultation     OTHER FINDINGS            ANESTHESIA HISTORY AND PHYSICAL EXAM    Date Time: 06/30/2015 12:59 PM; Evaluation by: Wandra Mannan MD  Patient Name: [name]  Attending Physician: Gean Quint, MD  Admission Date:06/29/2015      Assessment:      Patient Active Problem List   Diagnosis   . Lacunar infarction   . Lightheadedness   . Gastrointestinal hemorrhage with melena   . Symptomatic anemia   . Type 2 diabetes mellitus without complication   . Mixed hyperlipidemia   . Acute blood loss anemia       History of Present Illness:   [name] is a 80 y.o. male who presents to the hospital with Dizziness, blurred vision, diarrhea for 3 days.         Past Medical History   Diagnosis Date   . No diagnosis    . Diabetes mellitus    . Hyperlipidemia    . Cerebrovascular accident      03/2015            Past Surgical History   Procedure Laterality Date   . Appendectomy         Anesthetic Family History:   none       Social History   Substance Use Topics   . Smoking status: Current Some Day Smoker     Types: Cigarettes   . Smokeless tobacco: Not on file   . Alcohol Use: Yes      Allergies:     Allergies   Allergen Reactions   . Penicillins      Hospital Medications:     Current Facility-Administered Medications   Medication Dose Route Frequency   . pantoprazole  40 mg Intravenous BID     Home Medications:     Prescriptions prior to admission   Medication Sig Dispense Refill Last Dose   . aspirin 81 MG tablet Take 81 mg by mouth daily.      Marland Kitchen aspirin 325 MG tablet Take 1 tablet (325 mg total) by mouth daily. 30 tablet 3 Unknown at Unknown time   . brimonidine (ALPHAGAN P) 0.1 % Solution Place 1 drop into both eyes 2 (two) times daily.      Unknown at Unknown time   . glimepiride (AMARYL) 2 MG  tablet Take 2 mg by mouth every morning before breakfast.   Unknown at Unknown time   . metFORMIN (GLUCOPHAGE) 850 MG tablet Take 850 mg by mouth 2 (two) times daily with meals.   Unknown at Unknown time   . pantoprazole (PROTONIX) 40 MG tablet Take 1 tablet (40 mg total) by mouth daily. 30 tablet 0    . pioglitazone (ACTOS) 45 MG tablet Take 45 mg by mouth daily.   Unknown at Unknown time   . simvastatin (ZOCOR) 40 MG tablet Take 1 tablet (40 mg total) by mouth nightly. 30 tablet 0        Labs:       Recent Labs  Lab 06/30/15  0757  06/29/15  1249   WBC  --   --  5.43   HGB 8.5* More results in Results Review 8.5*   HEMATOCRIT 26.1* More results in Results Review 27.1*   PLATELETS  --   --  231   More results in Results Review = values in this interval not displayed.       Lab Results   Component Value Date    NA 139 06/30/2015    CL 111 06/30/2015    CO2 23 06/30/2015    K 4.1 06/30/2015    CREAT 0.9 06/30/2015    BUN 14.0 06/30/2015    GLU 65* 06/30/2015       Lab Results   Component Value Date    AST 15 06/30/2015    ALT 12 06/30/2015    TROPI <0.01 06/29/2015       No results found for: PT, PTT, INR    Rads:     Radiology Results (24 Hour)     Procedure Component Value Units Date/Time    XR Chest  AP Portable [[id number]] Collected:  06/29/15 1320    Order Status:  Completed Updated:  06/29/15 1324    Narrative:      HISTORY::Substernal chest pain    TECHNIQUE: An AP portable chest performed at 1305 hours    FINDINGS: The lungs are clear of acute focal infiltrates.  The heart,  mediastinum and bony thorax are unremarkable for age and AP technique.      Impression:       No radiographic evidence for acute cardiopulmonary disease.    Lorenda Peck, MD   06/29/2015 1:20 PM            EKG:       ECHO/CATH REPORT:   none    Consults Since Admission:                                Anesthesia Plan    ASA 3     general               (Patient interviewed, examined.  Reviewed PCP evaluations, consultations, labs and  imaging studies.    Discussed TIVA, possible general anesthesia with airway device..    The most common side effects from general anesthesia are nausea, vomiting, sore throat.  Uncommon side effects include but not limited to  injury to eyes, lips, teeth, gums, vocal cords, allergic reactions, nerve injuries. In addition there are side effects associated with your medical conditions.    Answered all questions to patient's satisfaction.  Pt understands and wishes to proceed.    Wandra Mannan, MD  06/30/2015 )      intravenous induction   Detailed anesthesia plan: general IV        Post op pain management: per surgeon    informed consent obtained    Plan discussed with CRNA.    ECG reviewed  pertinent labs reviewed  imaging results reviewed

## 2015-06-30 NOTE — Consults (Signed)
Nutritional Support Services  Nutrition Assessment    [name] 81 y.o. male   MRN: [id number]    Summary of Nutrition Recommendations:   1) continue clear liquid diet & when appropriate, advance diet as tolerated with CCHO diet   2) monitor BS level for hypo/hyperglycemia   3) monitor wt weekly   4) Provided patient with written information on [place], healthy eating with diabetes & heart healthy nutrition and number to call if questions.  Diet guidelines reviewed verbally with patient.  Good understanding shown.    -----------------------------------------------------------------------------------------------------------------                                                          Assessment Data:   Referral Source: RN screen  Reason for Referral: Unintentional weight loss of >= 10 pounds in one month    Nutrition: RN Stef reports pt NPO this breakfast & lunch, had EGD, to start clear liquids & advanced as tolerated. Met with pt, claimed he is known diabetic for years, asking dietary recommendation for gastritis & diabetes as well, states he eats 3 meals at home, usual wt at 165 lbs, last month wt at 152 lbs, weight loss attributed to stroke. Noted wt today at 149 lbs (3 lbs wt loss, not significant).    Hospital Admission: Lightheadedness. Systomatic anemia, GI bleed - GI on board, EGD done today.    Medical Hx:  has a past medical history of No diagnosis; Diabetes mellitus; Hyperlipidemia; and Cerebrovascular accident.    PSH: has past surgical history that includes Appendectomy.     Social/Diet Hx: Current Some Day Smoker, alcohol use occasionally    Orders Placed This Encounter   Procedures   . Diet clear liquid   Previous diet: CCHO to 8/29 NPO effective midnight  Intake: NPO for breakfast & lunch, clear liquids to start dinner      ANTHROPOMETRIC  Anthropometrics  Height: 175.3 cm (5' 9.02")  Weight: 67.813 kg (149 lb 8 oz)  Weight Change: 0  IBW/kg (Calculated) Male: 72.79 kg  IBW/kg (Calculated)  Male: 65.93 kg  BMI (calculated): 22.1    Wt Readings from Last 5 Encounters:   06/30/15 67.813 kg (149 lb 8 oz)   05/21/15 70.308 kg (155 lb)   03/23/15 64.456 kg (142 lb 1.6 oz)   03/05/15 68.493 kg (151 lb)   noted 6 lbs wt loss in less than 1 month (3.8% wt change, not significant)    Physical Assessment:   Edema: none noted  Skin: WDL  GI function: last BM on 8/28 - diarrhea per doc flow    ESTIMATED NEEDS  Estimated Energy Needs  Total Energy Estimated Needs: 1700-2040 Kcal/day  Method for Estimating Needs: 25-30 Kcal/kg per BMI    Estimated Protein Needs  Total Protein Estimated Needs: 68-88 gms pro/day  Method for Estimating Needs: 1.0-1.3 gm pro/kg per BMI    Fluid Needs  Total Fluid Estimated Needs: 1700-2075ml/day or per MD  Method for Estimating Needs: 1 ml/Kcal or per MD    Pertinent Medications: reviewed    IVF:    . sodium chloride 100 mL/hr at 06/30/15 0551   . lactated ringers 20 mL/hr at 06/30/15 1308       Allergies   Allergen Reactions   . Penicillins  Pertinent labs: reviewed - (8/28) POCT 105 at 2128  (03/23/2015) A1c 6.1%      Recent Labs  Lab 06/30/15  0757 06/29/15  2352 06/29/15  1734 06/29/15  1249   SODIUM 139  --   --  140   POTASSIUM 4.1  --   --  4.4   CHLORIDE 111  --   --  107   CO2 23  --   --  23   BUN 14.0  --   --  14.0   CREATININE 0.9  --   --  1.6*   GLUCOSE 65*  --   --  204*   CALCIUM 8.0  --   --  9.2   EGFR >60.0  --   --  50.4   WBC  --   --   --  5.43   HEMATOCRIT 26.1* 21.8* 25.8* 27.1*   HGB 8.5* 6.7* 8.2* 8.5*       Learning Needs: yes      Nutrition Assessment Summary: No significant wt loss in less than a month, noted current some day smoker. Provided patient with written information on Bland, healthy eating with diabetes & heart healthy nutrition and number to call if questions.  Diet guidelines reviewed verbally with patient.  Good understanding shown.                                                              Nutrition Diagnosis      Food and  nutrition-related knowledge deficit related to no previous nutrition education as evidenced by pt's request for diet education.                                                              Intervention     Nutrition recommendation - please refer to top page for details   Goal: increase pt's knowledge on nutrition related recommendations this admission.                                                             Monitoring     Po intake  Diet advancement  Labs  GI fxn  BS level                                                          Evaluation       Nutrition Risk Level: Moderate (will follow up within 7 days and PRN)     Griselda Miner, RD  Spectralink x [phone]  Office x [phone]

## 2015-06-30 NOTE — Progress Notes (Signed)
GI PRE PROCEDURE NOTE    Date Time: 06/30/2015 3:04 PM  Patient Name: [name]  Attending Physician: Gean Quint, MD    Proceduralist Comments:   Review of Systems and Past Medical / Surgical History performed: Yes     Indications:Melena    Previous Adverse Reaction to Anesthesia or Sedation (if yes, describe): No    Physical Exam / Laboratory Data (If applicable)   Airway Classification: Per Anesthesiologist    General: Alert and cooperative  Lungs: Lungs clear to auscultation  Cardiac: RRR, normal S1S2.    Abdomen: Soft, non tender. Normal active bowel sounds  Other:     No labs drawn    American Society of Anesthesiologists (ASA) Physical Status Classification:   Per Anesthesiologist    Planned Sedation:   Deep sedation with anesthesia    Attestation:   [name] has been reassessed immediately prior to the procedure and is an appropriate candidate for the planned sedation and procedure. Risks, benefits and alternatives to the planned procedure and sedation have been explained to the patient or guardian:  yes        Signed by: [name] MD       06/30/2015  3:04 PM

## 2015-06-30 NOTE — UM Notes (Signed)
Insurer:  Cherlyn Labella  Dx:  Symptomatic anemia  Hpi:  81 y.o. male with PMHx DM, hyperlipidemia, and CVA in 03/2015 presented to the ER yesterday with c/o dizziness and chest pain with ambulation yesterday. Pt admitted to be having dark/black-colored stool for the past few days and labs noted for anemia. Will plan for EGD today for further evaluation to r/o PUD, gastritis, H. Pylori, AVM, etc.   Vs:    06/29/15 1239   96.3 F (35.7 C)  --  76  18   86/44 mmHg  99 %  67.813 kg (149 lb 8 oz) KA           Significant test values     Ref. Range 06/29/2015 12:49   WBC Latest Ref Range: 3.50-10.80 x10 3/uL 5.43   Hemoglobin Latest Ref Range: 13.0-17.0 g/dL 8.5 (L)   Hematocrit Latest Ref Range: 42.0-52.0 % 27.1 (L)   RBC Latest Ref Range: 4.70-6.00 x10 6/uL 2.87 (L)   MCHC Latest Ref Range: 32.0-36.0 g/dL 96.0 (L)   RDW Latest Ref Range: 12-15 % 14   MPV Latest Ref Range: 9.4-12.3 fL 9.1 (L)   Glucose Latest Ref Range: 70-100 mg/dL 454 (H)   BUN Latest Ref Range: 9.0-28.0 mg/dL 09.8   Creatinine Latest Ref Range: 0.7-1.3 mg/dL 1.6 (H)   EGFR Unknown 50.4   Color, UA Latest Ref Range: Clear - Yellow  Amber (A)   Protein, UR Latest Ref Range: Negative  100 (A)   Hyaline Casts, UA Latest Ref Range: 0 - 5 /lpf TNTC (A)   Urine Mucus Latest Ref Range: None  Present   CULTURE BLOOD AEROBIC AND ANAEROBIC Unknown Rpt   Hemolysis Index Latest Ref Range: 0-18  2     meds administered in ed  protonix iv x1  ivf bolus x 1 liter  Inpatient admission to medicine (cpoe )  ) Keep pt NPO  2) Plan for EGD today for further evaluation  3) Hold ASA/anticoagulants for now  4) PPI BID  5) Monitor H/H and s/sx of GIB, transfuse PRN  6) Continue supportive care    Dcp:  Pending    Janie Morning, RN,   Case Manager 2  [facility]  [phone] (CM staff and providers only please)        [phone] (when on site)

## 2015-06-30 NOTE — Anesthesia Postprocedure Evaluation (Signed)
Anesthesia Post Evaluation    Patient: [name]    Procedures performed: Procedure(s):  EGD, BIOPSY    Anesthesia type: General TIVA    Patient location:[facility]    Last vitals:   Filed Vitals:    06/30/15 1516   BP: 137/62   Pulse: 70   Temp: 36.4 C (97.5 F)   Resp: 17   SpO2: 100%       Post pain: Patient not complaining of pain, continue current therapy      Mental Status:awake and sedated    Respiratory Function: tolerating room air    Cardiovascular: stable    Nausea/Vomiting: patient not complaining of nausea or vomiting    Hydration Status: adequate    Post assessment: no apparent anesthetic complications and no reportable events

## 2015-06-30 NOTE — Progress Notes (Signed)
Patient back in room from procedure. Tolerated well. Clear liquid diet, advance as tolerated. No concerns at this time. Will continue to monitor patient closely for any acute changes.

## 2015-06-30 NOTE — Consults (Signed)
CONSULTATION NOTE    [address], [address], [place], [place] [address]  [phone]  Philis Kendall [id number]    Date of admission: 06/29/2015  Date of consult: 06/30/2015      Assessment:  Symptomatic anemia  Suspect possible UGIB - c/o dark/black-colored stool  H/o CVA - on ASA daily outpatient  Hyperlipidemia  DM    [name] is a 81 y.o. male with PMHx DM, hyperlipidemia, and CVA in 03/2015 presented to the ER yesterday with c/o dizziness and chest pain with ambulation yesterday. Pt admitted to be having dark/black-colored stool for the past few days and labs noted for anemia. Will plan for EGD today for further evaluation to r/o PUD, gastritis, H. Pylori, AVM, etc.   ___________________________  Plan:  1) Keep pt NPO  2) Plan for EGD today for further evaluation  3) Hold ASA/anticoagulants for now  4) PPI BID  5) Monitor H/H and s/sx of GIB, transfuse PRN  6) Continue supportive care  7) Further recommendations pending EGD results  8) Will d/w Dr. Kandice Hams, NP   8:15 AM    Thank you for allowing [place] to see and participate in this patient's care.  This case will be discussed with Dr. Cindi Carbon who will see this patient as well today and will write an accompanying GI consultation and treatment plan.  ________________________    Referring Physician: Dr. Conley Rolls    Consulting Physician: Dr. Cindi Carbon    Reason for consultation: dark stool    Chief complaint: lightheaded, dizziness    HPI:  [name] is a 81 y.o. [place] male with PMHx DM, hyperlipidemia, and CVA in 03/2015 presented to the ER yesterday with c/o dizziness and chest pain with ambulation yesterday. Since his stroke, approximately 3 months ago, patient reported feeling unwell and has not been able to get back to his baseline. For the past few days, he reported having progressively worsening lightheaded and dizziness and decided to go to the ER. Upon evaluation in the ER, patient noted to be anemic with heme positive stool. Pt admits to  having issues with constipation with dark/black stool for the past few days. Having BM once every 3-4days. Pt reports having a 10lb weight loss since his stroke 3 months ago. Denies any fever, chills, nausea, or vomiting. No prior EGD/Colon. Denies any family history of colon cancer/polyps. Rare alcohol and tobacco use. Denies any illicit drug use. +ASA use. Denies any anticoagulant use.     Past Medical History   Diagnosis Date   . No diagnosis    . Diabetes mellitus    . Hyperlipidemia    . Cerebrovascular accident      03/2015       Past Surgical History   Procedure Laterality Date   . Appendectomy         Allergies   Allergen Reactions   . Penicillins        Social History     Social History   . Marital Status: Single     Spouse Name: N/A   . Number of Children: N/A   . Years of Education: N/A     Occupational History   . Not on file.     Social History Main Topics   . Smoking status: Current Some Day Smoker     Types: Cigarettes   . Smokeless tobacco: Not on file   . Alcohol Use: Yes   . Drug Use: No   .  Sexual Activity: Not on file     Other Topics Concern   . Not on file     Social History Narrative       No family history on file.    Current Discharge Medication List      CONTINUE these medications which have NOT CHANGED    Details   !! aspirin 81 MG tablet Take 81 mg by mouth daily.      !! aspirin 325 MG tablet Take 1 tablet (325 mg total) by mouth daily.  Qty: 30 tablet, Refills: 3      brimonidine (ALPHAGAN P) 0.1 % Solution Place 1 drop into both eyes 2 (two) times daily.         glimepiride (AMARYL) 2 MG tablet Take 2 mg by mouth every morning before breakfast.      metFORMIN (GLUCOPHAGE) 850 MG tablet Take 850 mg by mouth 2 (two) times daily with meals.      pantoprazole (PROTONIX) 40 MG tablet Take 1 tablet (40 mg total) by mouth daily.  Qty: 30 tablet, Refills: 0      pioglitazone (ACTOS) 45 MG tablet Take 45 mg by mouth daily.      simvastatin (ZOCOR) 40 MG tablet Take 1 tablet (40 mg total) by  mouth nightly.  Qty: 30 tablet, Refills: 0       !! - Potential duplicate medications found. Please discuss with provider.          Current Facility-Administered Medications   Medication Dose Route Frequency Last Rate Last Dose   . 0.9%  NaCl infusion   Intravenous Continuous 100 mL/hr at 06/30/15 0551     . 0.9%  NaCl infusion   Intravenous PRN       . acetaminophen (TYLENOL) tablet 650 mg  650 mg Oral Q6H PRN       . pantoprazole (PROTONIX) injection 40 mg  40 mg Intravenous BID   40 mg at 06/29/15 2210       Review of Systems  Constitutional  +10lb weight loss. Negative for fevers   Skin  Negative for rash   HENT  Negative for sore throat   Eyes  Negative for blurred vision   Cardiovascular  Negative for chest pain   Respiratory  Negative for SOB or cough   Gastrointestinal  SEE HPI   Genitourinary  Negative for dysuria, hematuria, or frequency   Musculoskeletal  Negative for joint pain   Endo +DM   Heme  Negative for anemia   Neurological  +dizziness. Negative for syncope   Psych  Negative for depression       Physical Exam  BP 134/59 mmHg  Pulse 73  Temp(Src) 96.1 F (35.6 C) (Oral)  Resp 16  Ht 1.753 m (5\' 9" )  Wt 67.813 kg (149 lb 8 oz)  BMI 22.07 kg/m2  SpO2 96%    General Appearance:    no acute distress, appears younger than stated age and looks comfortable   HEENT:    Normocephalic, without obvious abnormality, atraumatic, PERRL, sclera anicteric, oral mucosa pink/moist   Lungs:     Clear to auscultation bilaterally, no wheezing/rhonchi/rales    Heart:    Regular rate and rhythm, S1 and S2 normal, no murmur, rub or gallops appreciated   Abdomen:    soft, NT, ND, +BS, no hepatomegaly or mass noted    Rectal:   deferred    Extremities:   Extremities normal, atraumatic, no cyanosis or edema   Skin:  Skin color, texture, turgor normal, no rashes or lesions and no jaundice   Neurologic:   AAOx3, no focal deficits           Psychological: normal affect    Laboratory Data reviewed:      Recent Labs  Lab  06/30/15  0757 06/29/15  2352 06/29/15  1734 06/29/15  1249   WBC  --   --   --  5.43   HGB 8.5* 6.7* 8.2* 8.5*   HEMATOCRIT 26.1* 21.8* 25.8* 27.1*   PLATELETS  --   --   --  231   MCV  --   --   --  94.4   NEUTROPHILS  --   --   --  73       Recent Labs  Lab 06/29/15  1249   SODIUM 140   POTASSIUM 4.4   CHLORIDE 107   CO2 23   BUN 14.0   CREATININE 1.6*   GLUCOSE 204*   CALCIUM 9.2     Glucose:    Recent Labs  Lab 06/29/15  1249   GLUCOSE 204*           Ref. Range 06/29/2015 12:49   Troponin I Latest Ref Range: 0.00-0.09 ng/mL <0.01       Radiological Imaging reviewed:  Chest X-Ray (06/29/15):  IMPRESSION:   No radiographic evidence for acute cardiopulmonary disease.

## 2015-06-30 NOTE — Plan of Care (Signed)
Problem: Health Promotion  Goal: Knowledge - health resources  Extent of understanding and conveyed about healthcare resources.   Outcome: Progressing  [name]'s learning abilities have been assessed. Today's individualized plan of care to continue hourly rounding, pain management, was discussed with [name] and he agreed to it. [name] demonstrates understanding of disease process, treatment plan, medications and consequences of noncompliance. All questions and concerns were addressed.

## 2015-06-30 NOTE — OR Nursing (Signed)
Pt  tolerated procedure well.

## 2015-06-30 NOTE — Progress Notes (Signed)
PROGRESS NOTE                                                 Gean Quint MD, [place], CMD                                                             [phone]    Date Time: 06/30/2015 8:39 PM  Patient Name: [name], [name]  Patient status: Inpatient  Hospital Day: 1           Assessment:   Dark stool, melanotic  Status post upper gastrointestinal endoscopy.  Patient has erosive gastritis  History of hyperlipidemia.  History of CVA.  History of diabetes mellitus      Plan:   Protonix     Follow hematocrit serially.  If hematocrit is stable, will discharge patient tomorrow      DVT prophylaxis, SCDs  GI  Prophylaxis  Reason for contd. Hospitalization = gastrointestinal bleed  Subjective:   Patient admitted after having melanotic stool.  When patient came to the emergency room.  Hematocrit of 27.1, but later on, it dropped down to 21.8 for which she required blood transfusion.  His blood pressures remained stable          Medications:     Current Facility-Administered Medications   Medication Dose Route Frequency   . pantoprazole  40 mg Oral BID       Review of Systems:     General patient has no fever or chills.  Eyes no redness or discharge.  ENT no sore throat  Respiratory no shortness of breath.  Cardiovascular no chest pain  Gastrointestinal patient had dark melanotic stool.  Musculoskeletal skeletal No deformity  Neurological no focal weakness    Physical Exam:     Filed Vitals:    06/30/15 2009   BP: 148/65   Pulse: 77   Temp: 96 F (35.6 C)   Resp: 20   SpO2: 100%         Intake/Output Summary (Last 24 hours) at 06/30/15 2039  Last data filed at 06/30/15 1513   Gross per 24 hour   Intake 1217.67 ml   Output   1500 ml   Net -282.33 ml     Gen. appearance patient does not appear in any distress  Mental status is fully alert awake and oriented  Eyes extraocular movements are intact, pupils are equal, round and reactive to light and accommodation,  Nose clear.  Mouth clear  Neck no elevated JVD,  lymphadenopathy or thyromegaly.  Chest without any crackles or wheezing  Heart without any abnormal heart rate and rhythm without murmur  Abdomen without any hepatosplenomegaly or tenderness.  Extremities without any edema, clubbing or cyanosis   CBC w/Diff CMP     Recent Labs  Lab 06/30/15  0757 06/29/15  2352 06/29/15  1734 06/29/15  1249   WBC  --   --   --  5.43   HGB 8.5* 6.7* 8.2* 8.5*   HEMATOCRIT 26.1* 21.8* 25.8* 27.1*   PLATELETS  --   --   --  231   MCV  --   --   --  94.4   NEUTROPHILS  --   --   --  73       PT/INR           Recent Labs  Lab 06/30/15  0757 06/29/15  1249   SODIUM 139 140   POTASSIUM 4.1 4.4   CHLORIDE 111 107   CO2 23 23   BUN 14.0 14.0   CREATININE 0.9 1.6*   GLUCOSE 65* 204*   CALCIUM 8.0 9.2   PROTEIN, TOTAL 5.5*  --    ALBUMIN 2.8*  --    AST (SGOT) 15  --    ALT 12  --    ALKALINE PHOSPHATASE 58  --    BILIRUBIN, TOTAL 0.8  --       Glucose POCT     Recent Labs  Lab 06/30/15  0757 06/29/15  1249   GLUCOSE 65* 204*          Recent Labs  Lab 06/29/15  1249   TROPONIN I <0.01         ABGs:  No results found for: ABGCOLLECTIO, ALLENSTEST, PHART, PCO2ART, PO2ART, HCO3ART, BEART, O2SATART    Urinalysis    Recent Labs  Lab 06/29/15  1249   URINE TYPE Clean Catch   COLOR, UA Amber*   CLARITY, UA Hazy   SPECIFIC GRAVITY UA 1.019   URINE PH 6.0   NITRITE, UA Negative   KETONES UA Negative   UROBILINOGEN, UA Negative   BILIRUBIN, UA Negative   BLOOD, UA Negative   RBC, UA 0 - 5   WBC, UA 0 - 5         Rads:     Radiology Results (24 Hour)     ** No results found for the last 24 hours. Gean Quint, MD  06/30/2015  8:39 PM

## 2015-06-30 NOTE — Plan of Care (Signed)
Problem: GI Bleed  Goal: No Bleeding  Intervention: Monitor/assess lab values and report abnormal values.  Pt's H/H dropped from 8.2/25.8 to 6.7/21.8. VSS, denied SOB , lightheadedness, dizziness,  and SPO2 of 98% on RA. No active bleeding noted. Dr Gweneth Dimitri notified and ordered a unit of PRBCs. Will transfused PRBCs and continue monitoring pt's VS, SPO2 and H/H.

## 2015-06-30 NOTE — Transfer of Care (Signed)
Anesthesia Transfer of Care Note    Patient: [name]    Procedures performed: Procedure(s):  EGD, BIOPSY    Anesthesia type: General TIVA    Patient location:[facility]    Last vitals:   Filed Vitals:    06/30/15 1516   BP: 137/62   Pulse: 70   Temp: 36.4 C (97.5 F)   Resp: 17   SpO2: 100%       Post pain: Patient not complaining of pain, continue current therapy      Mental Status:sedated    Respiratory Function: tolerating nasal cannula    Cardiovascular: stable    Nausea/Vomiting: patient not complaining of nausea or vomiting    Hydration Status: adequate    Post assessment: no apparent anesthetic complications, no reportable events and no evidence of recall

## 2015-07-01 LAB — CBC
Hematocrit: 25.2 % — ABNORMAL LOW (ref 42.0–52.0)
Hgb: 8.2 g/dL — ABNORMAL LOW (ref 13.0–17.0)
MCH: 29.8 pg (ref 28.0–32.0)
MCHC: 32.5 g/dL (ref 32.0–36.0)
MCV: 91.6 fL (ref 80.0–100.0)
MPV: 10.4 fL (ref 9.4–12.3)
Nucleated RBC: 0 /100 WBC (ref 0–1)
Platelets: 217 10*3/uL (ref 140–400)
RBC: 2.75 10*6/uL — ABNORMAL LOW (ref 4.70–6.00)
RDW: 14 % (ref 12–15)
WBC: 5.03 10*3/uL (ref 3.50–10.80)

## 2015-07-01 LAB — LAB USE ONLY - HISTORICAL SURGICAL PATHOLOGY

## 2015-07-01 LAB — HEMOGLOBIN AND HEMATOCRIT, BLOOD
Hematocrit: 28.9 % — ABNORMAL LOW (ref 42.0–52.0)
Hgb: 9.2 g/dL — ABNORMAL LOW (ref 13.0–17.0)

## 2015-07-01 LAB — GLUCOSE WHOLE BLOOD - POCT
Whole Blood Glucose POCT: 87 mg/dL (ref 70–100)
Whole Blood Glucose POCT: 187 mg/dL — ABNORMAL HIGH (ref 70–100)

## 2015-07-01 MED ORDER — SODIUM CHLORIDE 0.9 % IV BOLUS
1000.0000 mL | Freq: Once | INTRAVENOUS | Status: AC
Start: 2015-07-01 — End: 2015-07-01
  Administered 2015-07-01: 1000 mL via INTRAVENOUS

## 2015-07-01 MED ORDER — PANTOPRAZOLE SODIUM 40 MG PO TBEC
40.0000 mg | DELAYED_RELEASE_TABLET | Freq: Two times a day (BID) | ORAL | Status: DC
Start: 2015-07-01 — End: 2015-08-05

## 2015-07-01 NOTE — Discharge Summary (Signed)
DISCHARGE SUMMARY    Date Time: 07/01/2015 11:04 AM  Patient Name: [name]  Attending Physician: Gean Quint, MD    Date of Admission:   06/29/2015    Date of Discharge:   07/01/2015    Reason for Admission:   Lightheadedness [R42]  Normocytic anemia [D64.9]  Heme positive stool [R19.5]  Renal insufficiency [N28.9]    Discharge Diagnosis   Dark stool, melanotic secondary to erosive gastritis  Status post upper gastrointestinal endoscopy. Patient has erosive gastritis  History of hyperlipidemia.  History of CVA.  History of diabetes mellitus    Past Medical History:     Past Medical History   Diagnosis Date   . No diagnosis    . Diabetes mellitus    . Hyperlipidemia    . Cerebrovascular accident      03/2015       Past Surgical History:     Past Surgical History   Procedure Laterality Date   . Appendectomy         Family History:   History reviewed. No pertinent family history.    Social History:     Social History     Social History   . Marital Status: Single     Spouse Name: N/A   . Number of Children: N/A   . Years of Education: N/A     Social History Main Topics   . Smoking status: Current Some Day Smoker     Types: Cigarettes   . Smokeless tobacco: Not on file      Comment: smokes 2-3 cigaretes wekly   . Alcohol Use: Yes      Comment: occassionally   . Drug Use: No   . Sexual Activity: Not on file     Other Topics Concern   . Not on file     Social History Narrative       Allergies:     Allergies   Allergen Reactions   . Penicillins        Code Status:    full code  Vaccination given :     There is no immunization history on file for this patient.  Influenza:   Pneumonia    Discharge Medications:        Medication List      CHANGE how you take these medications          aspirin 81 MG tablet   What changed:  Another medication with the same name was removed. Continue taking this medication, and follow the directions you see here.       pantoprazole 40 MG tablet   Commonly known as:  PROTONIX   Take 1  tablet (40 mg total) by mouth 2 (two) times daily.   What changed:  when to take this         CONTINUE taking these medications          brimonidine 0.1 % Soln   Commonly known as:  ALPHAGAN P       glimepiride 2 MG tablet   Commonly known as:  AMARYL       metFORMIN 850 MG tablet   Commonly known as:  GLUCOPHAGE       pioglitazone 45 MG tablet   Commonly known as:  ACTOS       simvastatin 40 MG tablet   Commonly known as:  ZOCOR   Take 1 tablet (40 mg total) by mouth nightly.         Where to  Get Your Medications     These are the prescriptions that you need to pick up.         You may get the following medications from any pharmacy   -  pantoprazole 40 MG tablet                      Consultations:   Treatment Team:   Attending Provider: Gean Quint, MD  Consulting Physician: Olivia Mackie, MD  Consulting Physician: Gean Quint, MD    Physical Exam:   BP 137/66 mmHg  Pulse 76  Temp(Src) 96.2 F (35.7 C) (Oral)  Resp 16  Ht 1.753 m (5' 9.02")  Wt 70.8 kg (156 lb 1.4 oz)  BMI 23.04 kg/m2  SpO2 98%      General: Not in acute distress  HEENT: NC/AT, pupil reacting to light  Neck: supple  Chest: Clear to auscultation  CV: normal S1 and S2  Abdomen: soft, BS+ve, non distended,nontender  Extremities: no edema  Neuro: AAOx3, moving all extremities    Labs:     Recent Labs  Lab 06/30/15  0757 06/29/15  1249   GLUCOSE 65* 204*   BUN 14.0 14.0   CREATININE 0.9 1.6*   CALCIUM 8.0 9.2   SODIUM 139 140   POTASSIUM 4.1 4.4   CHLORIDE 111 107   CO2 23 23   ALBUMIN 2.8*  --    AST (SGOT) 15  --    ALT 12  --    BILIRUBIN, TOTAL 0.8  --    ALKALINE PHOSPHATASE 58  --        Recent Labs  Lab 07/01/15  0921 07/01/15  0417 06/30/15  0757  06/29/15  1249   WBC  --  5.03  --   --  5.43   HGB 9.2* 8.2* 8.5* More results in Results Review 8.5*   HEMATOCRIT 28.9* 25.2* 26.1* More results in Results Review 27.1*   MCV  --  91.6  --   --  94.4   MCH  --  29.8  --   --  29.6   MCHC  --  32.5  --   --  31.4*   PLATELETS  --  217   --   --  231   More results in Results Review = values in this interval not displayed.            Microbiology Results     Procedure Component Value Units Date/Time    Blood Culture Aerobic and Anaerobic [[id number]] Collected:  06/29/15 1249    Specimen Information:  Blood from Blood Updated:  06/30/15 1621    Narrative:      ORDER#: [id number]                                    ORDERED BY: LE, LIZA  SOURCE: Blood arm                                    COLLECTED:  06/29/15 12:49  ANTIBIOTICS AT COLL.:                                RECEIVED :  06/29/15 15:55  Culture Blood Aerobic and Anaerobic  PRELIM      06/30/15 16:21  06/29/15   The amount of blood collected with this order is less than             the recommended volume (8-25mL per bottle). Collection of low             volumes may adversely affect recovery and/or detection time             of pathogens.              06/29/2015  15:56  06/30/15   No Growth after 1 day/s of incubation.                ABGs:  No results found for: ABGCOLLECTIO, ALLENSTEST, PHART, PCO2ART, PO2ART, HCO3ART, BEART, O2SATART    Urinalysis    Recent Labs  Lab 06/29/15  1249   URINE TYPE Clean Catch   COLOR, UA Amber*   CLARITY, UA Hazy   SPECIFIC GRAVITY UA 1.019   URINE PH 6.0   NITRITE, UA Negative   KETONES UA Negative   UROBILINOGEN, UA Negative   BILIRUBIN, UA Negative   BLOOD, UA Negative   RBC, UA 0 - 5   WBC, UA 0 - 5       RADIOLOGY :  Xr Chest  Ap Portable    06/29/2015   HISTORY::Substernal chest pain  TECHNIQUE: An AP portable chest performed at 1305 hours  FINDINGS: The lungs are clear of acute focal infiltrates.  The heart, mediastinum and bony thorax are unremarkable for age and AP technique.     06/29/2015    No radiographic evidence for acute cardiopulmonary disease.  Lorenda Peck, MD  06/29/2015 1:20 PM           Procedures performed:   No orders of the defined types were placed in this encounter.    esophagogastroduodenoscopy      Hospital Course:   See daily  progress notes.  81 year old gentleman was admitted to the hospital with melanotic stool.  He received 1 unit of packed red cell.  Underwent upper endoscopy which revealed erosive gastritis.  Patient is placed on Protonix 40 mg twice a day to be taken for 2 weeks, then will continue 40 mg daily.  Patient will be following up with gastroenterology and his primary care physician and a fissure          Discharge Instructions:   Discharge home    Follow-up with gastroenterology in 2 weeks    [name], MD  [address]  [place] [address]  [phone]    In 2 weeks      [name], MD  [address]  [place] [address]  [phone]    In 1 week        Signed by: Gean Quint MD

## 2015-07-01 NOTE — Plan of Care (Signed)
Problem: Physical Therapy  Goal: Patient condition is improving per Physical Therapy Treatment Plan  Outcome: Adequate for Discharge  Discharge Recommendation: Home with no needs       Is an Occupational Therapy Evaluation Indicated at this time? No, the patient does not require a OT evaluation.    Treatment/Interventions: No skilled interventions needed at this time  PT Frequency: one time visit     Early/Progressive Mobility Protocol Level: Independent  Updated on Patient's Flip Chart in Room  (Please See Therapy Evaluation for device and assistance level needed)    Goals:

## 2015-07-01 NOTE — Plan of Care (Signed)
Problem: GI Bleed  Goal: Fluid and electrolyte balance are achieved/maintained  Intervention: Monitor intake and output every shift  The patient's learning abilities have been assessed. Patient is on 24 tele, continuous fluid of NS, and schedule protonix. Today's individualized plan of care to monitor intake and out put, advance diet as tolerated to CCHD, and to monitor VS was discussed with patient and agree to it. Patient demonstrates understanding of disease process, treatment plan, medications and consequences of noncompliance. All questions and concerns were addressed.               Problem: Altered GI Function  Goal: Fluid and electrolyte balance are achieved/maintained  Intervention: Monitor intake and output every shift  The patient's learning abilities have been assessed. Patient is on 24 tele, continuous fluid of NS, and schedule protonix. Today's individualized plan of care to monitor intake and out put, advance diet as tolerated to CCHD, and to monitor VS was discussed with patient and agree to it. Patient demonstrates understanding of disease process, treatment plan, medications and consequences of noncompliance. All questions and concerns were addressed.

## 2015-07-01 NOTE — Progress Notes (Signed)
Patient had moderate BM. No blood visualized in stool. Patient possibly constipated from the look of the stool.

## 2015-07-01 NOTE — Discharge Summary -  Nursing (Addendum)
Patient was discharged home with cab services. Blood pressure stable not symptomatic. MD notified of patient blood pressure being stable and no more dizziness occuring when ambulating.  MD gave approval for patient discharge. Education was given on current medication list and  side effects.  Patient states understand of discharge instruction.  No other concern at this time.

## 2015-07-01 NOTE — Progress Notes (Signed)
Progress Note  [id number]    07/01/2015      Assessment:  Symptomatic anemia  Suspect possible UGIB - c/o dark/black-colored stool  H/o CVA - on ASA daily outpatient  Hyperlipidemia  DM    [name] is a 81 y.o. male admitted for symptomatic anemia with c/o dark/black-colored stool for the past few days. labs noted for anemia. S/p EGD yesterday revealing erosive gastritis. H/H relatively stable from yesterday.     Plan:  1) F/u pending EGD pathology results   2) Continue PPI BID  3) Monitor H/H and s/sx of GIB  4) Ok to resume ASA  5) May consider outpatient colonoscopy and further workup for weight loss as outpatient  6) Continue supportive care  7) Ok to d/c from GI perspective to f/u as outpatient in 1-2 weeks.   8) Will d/w Dr. Cindi Carbon    This case will be discussed with Dr. Cindi Carbon.  Geralynn Ochs, NP   8:46 AM    Subjective:  No new events overnight.   Pt c/o feeling weak and dizzy with exertion.   Denies any abdominal pain, nausea, or vomiting.   No overt s/sx of GIB.   H/H stable.     Objective:    Current Facility-Administered Medications   Medication Dose Route Frequency Last Rate Last Dose   . 0.9%  NaCl infusion   Intravenous Continuous 100 mL/hr at 07/01/15 0641     . 0.9%  NaCl infusion   Intravenous PRN       . acetaminophen (TYLENOL) tablet 650 mg  650 mg Oral Q6H PRN       . lactated ringers infusion   Intravenous Continuous 20 mL/hr at 06/30/15 1308     . pantoprazole (PROTONIX) EC tablet 40 mg  40 mg Oral BID   40 mg at 06/30/15 1846       Physical Exam:  BP 137/66 mmHg  Pulse 76  Temp(Src) 96.2 F (35.7 C) (Oral)  Resp 16  Ht 1.753 m (5' 9.02")  Wt 70.8 kg (156 lb 1.4 oz)  BMI 23.04 kg/m2  SpO2 98%    General Appearance: NAD, comfortable, AAOx3  Abd: soft, NT, ND, BS+, no masses appreciated       Recent Labs  Lab 07/01/15  0417 06/30/15  0757 06/29/15  2352  06/29/15  1249   WBC 5.03  --   --   --  5.43   HGB 8.2* 8.5* 6.7* More results in Results Review 8.5*      HEMATOCRIT 25.2* 26.1* 21.8* More results in Results Review 27.1*   PLATELETS 217  --   --   --  231   MCV 91.6  --   --   --  94.4   NEUTROPHILS  --   --   --   --  73   More results in Results Review = values in this interval not displayed.    Recent Labs  Lab 06/30/15  0757 06/29/15  1249   SODIUM 139 140   POTASSIUM 4.1 4.4   CHLORIDE 111 107   CO2 23 23   BUN 14.0 14.0   CREATININE 0.9 1.6*   GLUCOSE 65* 204*   CALCIUM 8.0 9.2   PROTEIN, TOTAL 5.5*  --    ALBUMIN 2.8*  --    AST (SGOT) 15  --    ALT 12  --    ALKALINE PHOSPHATASE 58  --    BILIRUBIN, TOTAL 0.8  --  Glucose:    Recent Labs  Lab 06/30/15  0757 06/29/15  1249   GLUCOSE 65* 204*

## 2015-07-01 NOTE — PT Eval Note (Signed)
[facility]  Physical Therapy Evaluation and D/C     Patient: [name]     MRN#: [id number]   Unit: [facility]  Bed: A4166/A6301-S    Time of Evaluation: Time Calculation  PT Received On: 07/01/15  Start Time: 0900  Stop Time: 0915  Time Calculation (min): 15 min    Consult received for [name] for PT Evaluation and Treatment.  Patient's medical condition is appropriate for Physical therapy intervention at this time.    Activity Orders: PT    Precautions and Contraindications:        Medical Diagnosis: Lightheadedness [R42]  Normocytic anemia [D64.9]  Heme positive stool [R19.5]  Renal insufficiency [N28.9]    History of Present Illness: [name] is a 81 y.o. male admitted on 06/29/2015 with c/o dizziness, blurred vision, Chest pain upon ambulation the morning of admission,      Patient Active Problem List   Diagnosis   . Lacunar infarction   . Lightheadedness   . Gastrointestinal hemorrhage with melena   . Symptomatic anemia   . Type 2 diabetes mellitus without complication   . Mixed hyperlipidemia   . Acute blood loss anemia        Past Medical/Surgical History:  Past Medical History   Diagnosis Date   . No diagnosis    . Diabetes mellitus    . Hyperlipidemia    . Cerebrovascular accident      03/2015      Past Surgical History   Procedure Laterality Date   . Appendectomy           X-Rays/Tests/Labs:  Lab Results   Component Value Date/Time    HGB 8.2* 07/01/2015 04:17 AM    HEMATOCRIT 25.2* 07/01/2015 04:17 AM    POTASSIUM 4.1 06/30/2015 07:57 AM    SODIUM 139 06/30/2015 07:57 AM    TROPONIN I <0.01 06/29/2015 12:49 PM     CXR 8-28  IMPRESSION:     No radiographic evidence for acute cardiopulmonary disease.         Social History:  Prior Level of Function  Prior level of function: Independent with ADLs  Baseline Activity Level: Community ambulation  Driving: other(comment) (limited distances)    Home Living Arrangements  Living Arrangements: Alone  Type of Home:  Apartment  Home Layout: One level    Subjective: Patient is agreeable to participation in the therapy session. Nursing clears patient for therapy.   Patient Goal: Get home  Pain Assessment  Pain Assessment: No/denies pain    Objective:  Observation of Patient/Vital Signs:  Filed Vitals:    07/01/15 0730   BP: 137/66   Pulse: 76   Temp: 96.2 F (35.7 C)   Resp: 16   SpO2: 98%         Patient received in bed with telemetry  and intravenous (IV) line in place.           Cognitive Status and Neuro Exam:  Cognition/Neuro Status  Arousal/Alertness: Appropriate responses to stimuli  Attention Span: Appears intact  Orientation Level: Oriented X4  Memory: Appears intact  Following Commands: Follows all commands and directions without difficulty  Safety Awareness: minimal verbal instruction  Insights: Fully aware of deficits;Educated in safety awareness  Behavior: calm;cooperative  Motor Planning: intact  Coordination: intact    Musculoskeletal Examination  Gross ROM  Right Upper Extremity ROM: within functional limits  Left Upper Extremity ROM: within functional limits  Right Lower Extremity ROM: within functional limits  Left Lower Extremity ROM: within functional limits  Gross Strength  Right Upper Extremity Strength: within functional limits  Left Upper Extremity Strength: within functional limits  Right Lower Extremity Strength: within functional limits  Left Lower Extremity Strength: within functional limits       Functional Mobility:  Functional Mobility  Rolling: Independent  Supine to Sit: Independent  Sit to Stand: Independent  Stand to Sit: Independent     Locomotion  Ambulation: Independent  Ambulation Distance (Feet):  (300)  Pattern: within functional limits     Balance  Balance  Balance: within functional limits    Participation and Activity Tolerance  Participation and Endurance  Participation Effort: good  Endurance: Endurance does not limit participation in activity      Educated the patient to role of  physical therapy, plan of care, goals of therapy and safety with mobility and ADLs, discharge instructions, home safety.   Patient left in bedside chair with  call bell and all personal items/needs within reach. RN notified of session outcome.      Discussed slow return to normal activities, to expect to be tired and not have as much energy.  Need to slowly increase activity to return to PLOF.    Assessment: [name] is a 81 y.o. male admitted 06/29/2015 .  Pt present at baseline for functional mobility and has no further skilled inpatient PT needs at this time.     Plan:  D/C inpatient PT    G codes: not indicated             Goals: N/A           Discharge Recommendation: Home with no needs     Mahealani Sulak, PT [phone]  PHYSICAL THERAPIST  07/01/2015 9:31 AM

## 2015-07-01 NOTE — Plan of Care (Signed)
Problem: Safety  Goal: Patient will be free from injury during hospitalization  Intervention: Assess patient's risk for falls and implement fall prevention plan of care per policy  Patient was educated on the importance of using the call light and getting assistance when trying to ambulate to bathroom.  Patient will be free from fall and injuries.

## 2015-07-01 NOTE — Plan of Care (Signed)
Problem: Pain  Goal: Patient's pain/discomfort is manageable  Patient and family was educated on pain management control.  Patient pain will be managed with turning or medication as needed.

## 2015-07-04 NOTE — Retrospective Coding Query (Signed)
PHYSICIAN'S DOCUMENTATION                                                                      REQUEST                                                                         Date of Request:  07/04/2015  Type of Request:  ANEMIA SPECIFICITY                                         Patient Name: [name], [name]  Account #: [id number]  MR #: [id number]  Discharge Date: 07/01/2015      Dear Dr. Graciela Husbands     Anemia is documented in this record.  The medical record documentation reflects the following:     Symptomatic anemia, received 1 unit of packed red cell.  8/29 labs : Hgb - 6.7  Hct 21.8     Question to Physician:    Please document the type of anemia:     Acute Blood Loss Anemia   Expected Acute Blood Loss Anemia    Chronic Blood Loss Anemia   Acute on Chronic Blood Loss Anemia   Dilutional Anemia   Anemia of Chronic Disease (ex, Anemia of Chronic Kidney Disease, Anemia of                Neoplastic Disease)   Anemia due to Chemotherapy   Aplastic Anemia   Anemia Secondary to Nutritional Deficiency (specify type)   Other (please specify)   Unable to Determine      Thank You     PHYSICIAN RESPONSE:  Patient had anemia of acute and chronic blood loss              [place] Bells  Date 07/04/2015

## 2015-07-10 ENCOUNTER — Encounter: Payer: Self-pay | Admitting: Gastroenterology

## 2015-08-01 ENCOUNTER — Emergency Department: Payer: Medicare (Managed Care)

## 2015-08-01 ENCOUNTER — Inpatient Hospital Stay
Admission: EM | Admit: 2015-08-01 | Discharge: 2015-08-05 | DRG: 378 | Disposition: A | Discharge status: Home / Self Care | Payer: Medicare (Managed Care) | Attending: Internal Medicine | Admitting: Internal Medicine

## 2015-08-01 ENCOUNTER — Inpatient Hospital Stay: Payer: Self-pay | Admitting: Internal Medicine

## 2015-08-01 DIAGNOSIS — D122 Benign neoplasm of ascending colon: Secondary | ICD-10-CM | POA: Diagnosis present

## 2015-08-01 DIAGNOSIS — N179 Acute kidney failure, unspecified: Secondary | ICD-10-CM | POA: Diagnosis present

## 2015-08-01 DIAGNOSIS — Z8673 Personal history of transient ischemic attack (TIA), and cerebral infarction without residual deficits: Secondary | ICD-10-CM

## 2015-08-01 DIAGNOSIS — F17200 Nicotine dependence, unspecified, uncomplicated: Secondary | ICD-10-CM | POA: Diagnosis present

## 2015-08-01 DIAGNOSIS — D125 Benign neoplasm of sigmoid colon: Secondary | ICD-10-CM | POA: Diagnosis present

## 2015-08-01 DIAGNOSIS — D175 Benign lipomatous neoplasm of intra-abdominal organs: Secondary | ICD-10-CM | POA: Diagnosis present

## 2015-08-01 DIAGNOSIS — E119 Type 2 diabetes mellitus without complications: Secondary | ICD-10-CM | POA: Diagnosis present

## 2015-08-01 DIAGNOSIS — Z7984 Long term (current) use of oral hypoglycemic drugs: Secondary | ICD-10-CM

## 2015-08-01 DIAGNOSIS — K5730 Diverticulosis of large intestine without perforation or abscess without bleeding: Secondary | ICD-10-CM | POA: Diagnosis present

## 2015-08-01 DIAGNOSIS — D62 Acute posthemorrhagic anemia: Secondary | ICD-10-CM | POA: Diagnosis present

## 2015-08-01 DIAGNOSIS — Z7982 Long term (current) use of aspirin: Secondary | ICD-10-CM

## 2015-08-01 DIAGNOSIS — K648 Other hemorrhoids: Secondary | ICD-10-CM | POA: Diagnosis present

## 2015-08-01 DIAGNOSIS — K5900 Constipation, unspecified: Secondary | ICD-10-CM | POA: Diagnosis present

## 2015-08-01 DIAGNOSIS — E785 Hyperlipidemia, unspecified: Secondary | ICD-10-CM | POA: Diagnosis present

## 2015-08-01 DIAGNOSIS — R55 Syncope and collapse: Secondary | ICD-10-CM | POA: Diagnosis present

## 2015-08-01 DIAGNOSIS — K921 Melena: Principal | ICD-10-CM | POA: Diagnosis present

## 2015-08-01 LAB — CBC AND DIFFERENTIAL
Basophils Absolute Automated: 0.02 10*3/uL (ref 0.00–0.20)
Basophils Automated: 0 %
Eosinophils Absolute Automated: 0.05 10*3/uL (ref 0.00–0.70)
Eosinophils Automated: 1 %
Hematocrit: 24.7 % — ABNORMAL LOW (ref 42.0–52.0)
Hgb: 7.7 g/dL — ABNORMAL LOW (ref 13.0–17.0)
Immature Granulocytes Absolute: 0 10*3/uL
Immature Granulocytes: 0 %
Lymphocytes Absolute Automated: 1.16 10*3/uL (ref 0.50–4.40)
Lymphocytes Automated: 26 %
MCH: 28.4 pg (ref 28.0–32.0)
MCHC: 31.2 g/dL — ABNORMAL LOW (ref 32.0–36.0)
MCV: 91.1 fL (ref 80.0–100.0)
MPV: 10.3 fL (ref 9.4–12.3)
Monocytes Absolute Automated: 0.32 10*3/uL (ref 0.00–1.20)
Monocytes: 7 %
Neutrophils Absolute: 2.88 10*3/uL (ref 1.80–8.10)
Neutrophils: 65 %
Nucleated RBC: 0 /100 WBC (ref 0–1)
Platelets: 218 10*3/uL (ref 140–400)
RBC: 2.71 10*6/uL — ABNORMAL LOW (ref 4.70–6.00)
RDW: 14 % (ref 12–15)
WBC: 4.43 10*3/uL (ref 3.50–10.80)

## 2015-08-01 LAB — COMPREHENSIVE METABOLIC PANEL
ALT: 7 U/L (ref 0–55)
AST (SGOT): 15 U/L (ref 5–34)
Albumin/Globulin Ratio: 1.2 (ref 0.9–2.2)
Albumin: 3.5 g/dL (ref 3.5–5.0)
Alkaline Phosphatase: 58 U/L (ref 38–106)
Anion Gap: 10 (ref 5.0–15.0)
BUN: 21 mg/dL (ref 9.0–28.0)
Bilirubin, Total: 0.3 mg/dL (ref 0.2–1.2)
CO2: 23 mEq/L (ref 22–29)
Calcium: 9.2 mg/dL (ref 7.9–10.2)
Chloride: 109 mEq/L (ref 100–111)
Creatinine: 1.9 mg/dL — ABNORMAL HIGH (ref 0.7–1.3)
Globulin: 2.9 g/dL (ref 2.0–3.6)
Glucose: 94 mg/dL (ref 70–100)
Potassium: 4.8 mEq/L (ref 3.5–5.1)
Protein, Total: 6.4 g/dL (ref 6.0–8.3)
Sodium: 142 mEq/L (ref 136–145)

## 2015-08-01 LAB — GLUCOSE WHOLE BLOOD - POCT: Whole Blood Glucose POCT: 93 mg/dL (ref 70–100)

## 2015-08-01 LAB — HEMOLYSIS INDEX: Hemolysis Index: 7 (ref 0–18)

## 2015-08-01 LAB — TROPONIN I: Troponin I: <0.01 ng/mL (ref 0.00–0.09)

## 2015-08-01 LAB — GFR: EGFR: 41.3

## 2015-08-01 MED ORDER — ONDANSETRON HCL 4 MG/2ML IJ SOLN
4.0000 mg | Freq: Four times a day (QID) | INTRAMUSCULAR | Status: DC | PRN
Start: 2015-08-01 — End: 2015-08-05

## 2015-08-01 MED ORDER — INSULIN ASPART 100 UNIT/ML SC SOLN
1.0000 [IU] | Freq: Three times a day (TID) | SUBCUTANEOUS | Status: DC | PRN
Start: 2015-08-01 — End: 2015-08-05
  Administered 2015-08-03: 1 [IU] via SUBCUTANEOUS
  Filled 2015-08-01: qty 1

## 2015-08-01 MED ORDER — INSULIN ASPART 100 UNIT/ML SC SOLN
1.0000 [IU] | Freq: Every evening | SUBCUTANEOUS | Status: DC | PRN
Start: 2015-08-01 — End: 2015-08-05
  Administered 2015-08-02 – 2015-08-04 (×2): 1 [IU] via SUBCUTANEOUS
  Filled 2015-08-01 (×2): qty 1

## 2015-08-01 MED ORDER — SODIUM CHLORIDE 0.9 % IV SOLN
8.0000 mg/h | INTRAVENOUS | Status: DC
Start: 2015-08-01 — End: 2015-08-02
  Administered 2015-08-01 – 2015-08-02 (×2): 8 mg/h via INTRAVENOUS
  Filled 2015-08-01 (×2): qty 80

## 2015-08-01 MED ORDER — ACETAMINOPHEN 325 MG PO TABS
650.0000 mg | ORAL_TABLET | ORAL | Status: DC | PRN
Start: 2015-08-01 — End: 2015-08-05
  Administered 2015-08-03: 650 mg via ORAL
  Filled 2015-08-01: qty 2

## 2015-08-01 MED ORDER — GLUCAGON 1 MG IJ SOLR (WRAP)
1.0000 mg | INTRAMUSCULAR | Status: DC | PRN
Start: 2015-08-01 — End: 2015-08-05

## 2015-08-01 MED ORDER — SODIUM CHLORIDE 0.9 % IV BOLUS
1000.0000 mL | Freq: Once | INTRAVENOUS | Status: AC
Start: 2015-08-01 — End: 2015-08-01
  Administered 2015-08-01: 1000 mL via INTRAVENOUS

## 2015-08-01 MED ORDER — DEXTROSE 50 % IV SOLN
25.0000 mL | INTRAVENOUS | Status: DC | PRN
Start: 2015-08-01 — End: 2015-08-05

## 2015-08-01 MED ORDER — PANTOPRAZOLE SODIUM 40 MG IV SOLR
80.0000 mg | Freq: Once | INTRAVENOUS | Status: AC
Start: 2015-08-01 — End: 2015-08-01
  Administered 2015-08-01: 80 mg via INTRAVENOUS
  Filled 2015-08-01: qty 80

## 2015-08-01 NOTE — ED Notes (Signed)
Bed: PU35  Expected date:   Expected time:   Means of arrival:   Comments:  Medic 410B

## 2015-08-01 NOTE — ED Notes (Signed)
Patient biba. Patient reports syncopal episode. Patient denies hitting head. Patient reports dizziness and dry mouth. Patient reports headache. EMS dexi 102.

## 2015-08-01 NOTE — ED Provider Notes (Signed)
EMERGENCY DEPARTMENT HISTORY AND PHYSICAL EXAM     Physician/Midlevel provider first contact with patient: 08/01/15 1620         Date: 08/01/2015  Patient Name: [name]    History of Presenting Illness     Chief Complaint   Patient presents with   . Syncope       History Provided By: Pt    Chief Complaint: Syncopal episode  Onset: PTA  Timing: Sudden  Severity: Moderate  Modifying Factors: None  Associated Symptoms: Dizziness, HA, melena.     Additional History: [name] is a 81 y.o. male BIBA after a syncopal episode that occurred while sitting in a chair outside after eating lunch JPTA. Pt states he felt dizzy after which he "lay down and passed out." Pt now c/o dizziness and a HA both of which he had p/t the syncopal episode. Also reports melena. No numbness. EMS DEXI was 102.     PCP: [name], MD      Current Facility-Administered Medications   Medication Dose Route Frequency Provider Last Rate Last Dose   . pantoprazole (PROTONIX) 80 mg in sodium chloride 0.9 % 100 mL infusion  8 mg/hr Intravenous Continuous Sharyne Richters, MD 10 mL/hr at 08/01/15 1941 8 mg/hr at 08/01/15 1941       Past History     Past Medical History:  Past Medical History   Diagnosis Date   . No diagnosis    . Diabetes mellitus    . Hyperlipidemia    . Cerebrovascular accident      03/2015       Past Surgical History:  Past Surgical History   Procedure Laterality Date   . Appendectomy     . Egd, biopsy N/A 06/30/2015     Procedure: EGD, BIOPSY;  Surgeon: [name], MD;  Location: [facility];  Service: Gastroenterology;  Laterality: N/A;       Family History:  History reviewed. No pertinent family history.    Social History:  Social History   Substance Use Topics   . Smoking status: Current Some Day Smoker     Types: Cigarettes   . Smokeless tobacco: None      Comment: smokes 2-3 cigaretes wekly   . Alcohol Use: Yes      Comment: occassionally       Allergies:  Allergies   Allergen Reactions   . Penicillins         Review of Systems     Review of Systems   Constitutional: Negative for diaphoresis.   Respiratory: Negative for shortness of breath.    Cardiovascular: Negative for chest pain.   Gastrointestinal:        +melena   Neurological: Positive for dizziness, syncope and headaches. Negative for speech difficulty, weakness and numbness.   All other systems reviewed and are negative.         Physical Exam   BP 128/60 mmHg  Pulse 80  Temp(Src) 97.6 F (36.4 C) (Oral)  Resp 18  Ht 5\' 9"  (1.753 m)  Wt 66.996 kg  BMI 21.80 kg/m2  SpO2 97%    Physical Exam   Constitutional: Patient is oriented to person, place, and time and well-developed, well-nourished, and in no distress.   Head: Atraumatic.   Eyes: EOMI. PERRL.  Pallor.   ENT:  MMM.   Neck:  Supple.   Cardiovascular: Normal rate and regular rhythm.   Pulmonary/Chest: Effort normal and breath sounds normal. No respiratory distress.  Abdominal: Soft. There is no tenderness. Bowel sounds present and normal.    Musculoskeletal:  No lower extremity edema or tenderness.    Neurological: Patient is alert and oriented to person, place, and time.  No focal deficits.   Skin: Skin is warm and dry.    Diagnostic Study Results     Labs -     Results     Procedure Component Value Units Date/Time    Troponin I [[id number]] Collected:  08/01/15 1622    Specimen Information:  Blood Updated:  08/01/15 1718     Troponin I <0.01 ng/mL     Comprehensive Metabolic Panel (CMP) [[id number]]  (Abnormal) Collected:  08/01/15 1622    Specimen Information:  Blood Updated:  08/01/15 1711     Glucose 94 mg/dL      BUN 52.8 mg/dL      Creatinine 1.9 (H) mg/dL      Sodium 413 mEq/L      Potassium 4.8 mEq/L      Chloride 109 mEq/L      CO2 23 mEq/L      Calcium 9.2 mg/dL      Protein, Total 6.4 g/dL      Albumin 3.5 g/dL      AST (SGOT) 15 U/L      ALT 7 U/L      Alkaline Phosphatase 58 U/L      Bilirubin, Total 0.3 mg/dL      Globulin 2.9 g/dL      Albumin/Globulin Ratio 1.2      Anion Gap 10.0      Hemolysis index [[id number]] Collected:  08/01/15 1622     Hemolysis Index 7 Updated:  08/01/15 1711    GFR [[id number]] Collected:  08/01/15 1622     EGFR 41.3 Updated:  08/01/15 1711    CBC and differential [[id number]]  (Abnormal) Collected:  08/01/15 1622    Specimen Information:  Blood from Blood Updated:  08/01/15 1653     WBC 4.43 x10 3/uL      Hgb 7.7 (L) g/dL      Hematocrit 75.6 (L) %      Platelets 218 x10 3/uL      RBC 2.71 (L) x10 6/uL      MCV 91.1 fL      MCH 28.4 pg      MCHC 31.2 (L) g/dL      RDW 14 %      MPV 10.3 fL      Neutrophils 65 %      Lymphocytes Automated 26 %      Monocytes 7 %      Eosinophils Automated 1 %      Basophils Automated 0 %      Immature Granulocyte 0 %      Nucleated RBC 0 /100 WBC      Neutrophils Absolute 2.88 x10 3/uL      Abs Lymph Automated 1.16 x10 3/uL      Abs Mono Automated 0.32 x10 3/uL      Abs Eos Automated 0.05 x10 3/uL      Absolute Baso Automated 0.02 x10 3/uL      Absolute Immature Granulocyte 0.00 x10 3/uL           Radiologic Studies -   Radiology Results (24 Hour)     Procedure Component Value Units Date/Time    CT Head without Contrast [[id number]] Collected:  08/01/15 1716    Order Status:  Completed Updated:  08/01/15  1721    Narrative:      CLINICAL INDICATION:  Headache, syncope    TECHNIQUE:  Axial noncontrast CT images were obtained from the skull  base to the vertex. A combination of a automatic exposure control,  adjustment of the mA and/or kV  according to patient size and/or use of  iterative reconstruction technique was utilized.      COMPARISON:  05/21/2015    INTERPRETATION: There is no acute intracranial hemorrhage or extra axial  fluid collection. There is stable periventricular low-attenuation,  consistent with chronic small vessel ischemic disease. The ventricles  have a normal configuration. There is no midline shift or mass effect.  The gray-white matter differentiation is normal.    Visualized paranasal sinuses and mastoid air cells  are clear.          Impression:        No acute intracranial abnormality.    Wyatt Portela, MD   08/01/2015 5:17 PM        .      Medical Decision Making   I am the first provider for this patient.    I reviewed the vital signs, available nursing notes, past medical history, past surgical history, family history and social history.    Vital Signs-Reviewed the patient's vital signs.     Patient Vitals for the past 12 hrs:   BP Temp Pulse Resp   08/01/15 1915 128/60 mmHg 97.6 F (36.4 C) 80 18   08/01/15 1621 120/56 mmHg - 71 18       Pulse Oximetry Analysis -  Normal 99% on RA    Cardiac Monitor:  Rate: 77  Rhythm:  Normal Sinus Rhythm     EKG:  Interpreted by the EP.   Time Interpreted: 1615   Rate: 75   Rhythm: Normal Sinus Rhythm    Interpretation: No ST/T wave changes. Normal intervals.    Comparison: predominantly unchanged c/w 06/29/2015    Old Medical Records: Previous electrocardiograms. (See EKG interpretation above)  Nursing notes.     ED Course:   4:41 PM - Pt awake, conversive, in NAD. Will hydrate, check labs. Pt agreeable.     7:09 PM - Discussed patient case with Dr. Yehuda Savannah, [facility], who agrees with the plan and accepts for hospitalization for Dr Graciela Husbands who admits for pt's PCP. Requests GI consult.     7:39 PM - Non urgent GI consult message left on overnight line.     Provider Notes: Syncope and collapse in patient w/over 1.5 gm drop in hgb over last 1 month.  He stills feels dizzy despite IVFs.  He reports diff w/BM and frequently black stools.  He continues on ASA and protonix BID.  Admit for serial H/H.  Hold ASA.     Diagnosis     Clinical Impression:   1. Syncope and collapse    2. Melena    3. Acute blood loss anemia        Disposition:   ED Disposition     Admit Admitting Physician: Gean Quint [phone]  Diagnosis: Syncope [206001]  Estimated Length of Stay: > or = to 2 midnights  Tentative Discharge Plan?: Home or Self Care [1]  Patient Class: Inpatient [101]  I certify that  inpatient services are medically necessary for this patient. Please see H&P and MD progress notes for additional information about the patient's course of treatment. For Medicare patients, services provided in accordance with 412.3 and expected LOS to be  greater than 2 midnights for Medicare patients.: Yes            _______________________________    Attestations:  This note is prepared by Latrelle Dodrill, acting as Scribe for Tenny Craw, MD.     Tenny Craw, MD:  The scribe's documentation has been prepared under my direction and personally reviewed by me in its entirety.  I confirm that the note above accurately reflects all work, treatment, procedures, and medical decision making performed by me.  _______________________________          Sharyne Richters, MD  08/01/15 [phone]

## 2015-08-02 LAB — URINALYSIS, REFLEX TO MICROSCOPIC EXAM IF INDICATED
Bilirubin, UA: NEGATIVE
Blood, UA: NEGATIVE
Glucose, UA: NEGATIVE
Ketones UA: NEGATIVE
Leukocyte Esterase, UA: NEGATIVE
Nitrite, UA: NEGATIVE
Protein, UR: NEGATIVE
Specific Gravity UA: 1.011 (ref 1.001–1.035)
Urine pH: 6 (ref 5.0–8.0)
Urobilinogen, UA: NEGATIVE mg/dL

## 2015-08-02 LAB — HEMOGLOBIN AND HEMATOCRIT, BLOOD
Hematocrit: 24.9 % — ABNORMAL LOW (ref 42.0–52.0)
Hgb: 7.7 g/dL — ABNORMAL LOW (ref 13.0–17.0)

## 2015-08-02 LAB — CBC
Hematocrit: 24.8 % — ABNORMAL LOW (ref 42.0–52.0)
Hgb: 7.7 g/dL — ABNORMAL LOW (ref 13.0–17.0)
MCH: 28.2 pg (ref 28.0–32.0)
MCHC: 31 g/dL — ABNORMAL LOW (ref 32.0–36.0)
MCV: 90.8 fL (ref 80.0–100.0)
MPV: 10.6 fL (ref 9.4–12.3)
Nucleated RBC: 0 /100 WBC (ref 0–1)
Platelets: 214 10*3/uL (ref 140–400)
RBC: 2.73 10*6/uL — ABNORMAL LOW (ref 4.70–6.00)
RDW: 14 % (ref 12–15)
WBC: 4.21 10*3/uL (ref 3.50–10.80)

## 2015-08-02 LAB — GLUCOSE WHOLE BLOOD - POCT
Whole Blood Glucose POCT: 52 mg/dL — ABNORMAL LOW (ref 70–100)
Whole Blood Glucose POCT: 93 mg/dL (ref 70–100)
Whole Blood Glucose POCT: 113 mg/dL — ABNORMAL HIGH (ref 70–100)
Whole Blood Glucose POCT: 170 mg/dL — ABNORMAL HIGH (ref 70–100)

## 2015-08-02 LAB — TROPONIN I: Troponin I: <0.01 ng/mL (ref 0.00–0.09)

## 2015-08-02 MED ORDER — PANTOPRAZOLE SODIUM 40 MG IV SOLR
40.0000 mg | Freq: Two times a day (BID) | INTRAVENOUS | Status: DC
Start: 2015-08-02 — End: 2015-08-05
  Administered 2015-08-02 – 2015-08-05 (×6): 40 mg via INTRAVENOUS
  Filled 2015-08-02 (×6): qty 40

## 2015-08-02 MED ORDER — SODIUM CHLORIDE 0.9 % IV SOLN
INTRAVENOUS | Status: DC
Start: 2015-08-02 — End: 2015-08-05

## 2015-08-02 NOTE — Plan of Care (Signed)
Problem: GI Bleed  Goal: No Bleeding  Outcome: Progressing  [name] is a 81 y.o. male here for:   Patient Active Problem List   Diagnosis   . Lacunar infarction   . Lightheadedness   . Gastrointestinal hemorrhage with melena   . Symptomatic anemia   . Type 2 diabetes mellitus without complication   . Mixed hyperlipidemia   . Acute blood loss anemia   . Syncope   Patient is stable and without questions or concerns at this time. Overall Plan Of Care (POC) as well as specific POC for this shift was reviewed with outgoing nurse and patient. Patient verbalizes the understanding of diagnosis, POC which includes patient's participation in hourly rounding, cardiac monitoring, DVT/GI prophylaxis, PT/OT evaluation and treatment, discharge planning, the importance of safety, risk management and pain management. Patient understands primary goal is to ensure vital signs, cardiac rhythm, and fluid and electrolyte balance are achieved/maintained.   Vital signs are stable: Yes  BP 114/56 mmHg  Pulse 78  Temp(Src) 97.3 F (36.3 C) (Oral)  Resp 19  Ht 1.753 m (5\' 9" )  Wt 66.99 kg (147 lb 11 oz)  BMI 21.80 kg/m2  SpO2 98%  Fluid and electrolyte balance is currently stable: Yes  Elimination patterns are normal or improving: Yes  Nutritional intake is adequate: Yes, patient is on clear liquids and would upgrade as soon as he is clear by GI MD or Attending  Mobility/activity is maintained at an optimal level: Yes  Patient is having regular and routine bowel evacuation: no BM on yet and none during the night per night nurse  Patient is free of s/s of bleeding  Patient agrees to notify RN with any questions or change in status: Yes

## 2015-08-02 NOTE — Plan of Care (Signed)
Problem: Moderate/High Fall Risk Score >5  Goal: Patient will remain free of falls  Outcome: Progressing  Pt alert and oriented x4. Reported mild headache, nausea and dizziness upon admission. Denies SOB. Voiding in urinal. No emesis or bowel movement this shift. Protonix IV infusing per order. Clear liquid diet. Blood sugar check. BS 93. No insulin coverage needed. VSS. NSR on telemetry. Admission documents completed and filed in chart. Fall mat in place. Bed alarm. Cont to monitor and assess.

## 2015-08-02 NOTE — Consults (Signed)
CONSULTATION NOTE    [address], [address], [place], [place] [address]  [phone]  Philis Kendall [id number]    Date of admission: 08/01/2015  Date of consult: 08/02/2015  GI Attending Note  Patient seen and evaluated. Unclear if patient is having recurrent bleeding, especially considering that he had minimal findings on last EGD. Consider possible repeat EGD and colonoscopy pending clinical course. Will continue to follow.     Bonnita Levan II, MD      Assessment:  Syncope  ?UGIB - c/o hard black stool. Recently had EGD in 06/2015 revealing erosive gastritis.   Constipation  Anemia  H/o CVA in 03/2015 - on ASA daily outpatient  HLD  DM    [name] is a 81 y.o. male with PMHx DM, HLD, h/o CVA in 03/2015 on ASA presented to the ER yesterday with c/o dizziness and passing hard black stool. Recently hospitalized in 8/28-8/30 with similar complaints. During that time he had an EGD done revealing erosive esophagitis. H/H noted for slight drop to 7.7/24.7 compared to 9.2/28.9 at discharge during last hospitalization.   ___________________________  Plan:  1) Monitor H/H and s/sx of GIB, transfuse PRN  2) Continue PPI gtt for now  3) May consider repeat EGD +/- colon, pending patient's clinical status. Will discuss with GI attending  4) Hold NSAID/anticoagulation for now.   5) Continue supportive care  6) Will d/w Dr. Lajuana Matte Weyman Pedro, NP   9:33 AM    Thank you for allowing [place] to see and participate in this patient's care.  This case will be discussed with Dr. Maple Hudson who will see this patient as well today and will write an accompanying GI consultation and treatment plan.  ________________________    Referring Physician: Dr. Fran Lowes    Consulting Physician: Dr. Maple Hudson    Reason for consultation: black stool    Chief complaint: syncope    HPI:  [name] is a 81 y.o. male with PMHx DM, HLD, h/o CVA in 03/2015 on ASA. Pt was recently hospitalized from 8/28-8/30 with complaints of dizziness and black stool.  During that time, he received 1uPRBC and had an EGD done by Dr. Cindi Carbon revealing revealing erosive gastritis with pathology revealing mild chronic duodenitis and reactive gastropathy. Pt now back again with similar complaints. For the past 2 weeks, reports feeling tired and having increased dizziness with exertion. Yesterday, reported feeling very dizzy after lunch and had a syncopal episode and then was brought into the hospital for further evaluation. Pt denies any fever, chills, nausea, vomiting, SOB, chest pain, changes in bowel habits. Pt continues to c/o issues with constipation with dark/black stool for the past 2-3 days. On ASA daily. Denies any NSAID or anticoagulant use. Denies any family history of colon cancer/polyps. Denies any alcohol or tobacco use.  No prior colonoscopy.     EGD 06/30/15 by Dr. Cindi Carbon:  Summary:  Normal esophagus.   Erosive gastritis found in stomach antrum.   Duodenum appeared to be normal.     Pathology:  A. DUODENUM, BIOPSY:   - BENIGN SMALL BOWEL MUCOSA WITH MILD CHRONIC DUODENITIS   - NO FEATURES OF MALABSORPTION SYNDROME IDENTIFIED     B. STOMACH, ANTRUM, BIOPSY:   - REACTIVE GASTROPATHY PATTERN   - NO INTESTINAL METAPLASIA, DYSPLASIA OR MALIGNANCY IDENTIFIED   - NO HELICOBACTER PYLORI ORGANISMS IDENTIFIED     Past Medical History   Diagnosis Date   . No diagnosis    .  Diabetes mellitus    . Hyperlipidemia    . Cerebrovascular accident      03/2015       Past Surgical History   Procedure Laterality Date   . Appendectomy     . Egd, biopsy N/A 06/30/2015     Procedure: EGD, BIOPSY;  Surgeon: [name], MD;  Location: [facility];  Service: Gastroenterology;  Laterality: N/A;       Allergies   Allergen Reactions   . Penicillins        Social History     Social History   . Marital Status: Single     Spouse Name: N/A   . Number of Children: N/A   . Years of Education: N/A     Occupational History   . Not on file.     Social History Main Topics   . Smoking status:  Current Some Day Smoker     Types: Cigarettes   . Smokeless tobacco: Not on file      Comment: smokes 2-3 cigaretes wekly   . Alcohol Use: Yes      Comment: occassionally   . Drug Use: No   . Sexual Activity: Not on file     Other Topics Concern   . Not on file     Social History Narrative       History reviewed. No pertinent family history.    Current Discharge Medication List      CONTINUE these medications which have NOT CHANGED    Details   aspirin 81 MG tablet Take 81 mg by mouth daily.      brimonidine (ALPHAGAN P) 0.1 % Solution Place 1 drop into both eyes 2 (two) times daily.         glimepiride (AMARYL) 2 MG tablet Take 2 mg by mouth every morning before breakfast.      metFORMIN (GLUCOPHAGE) 850 MG tablet Take 1,000 mg by mouth 2 (two) times daily with meals.         pantoprazole (PROTONIX) 40 MG tablet Take 1 tablet (40 mg total) by mouth 2 (two) times daily.  Qty: 60 tablet, Refills: 0    Comments: Take twice a  Day for 15  Days than once a day      pioglitazone (ACTOS) 45 MG tablet Take 45 mg by mouth daily.      simvastatin (ZOCOR) 40 MG tablet Take 1 tablet (40 mg total) by mouth nightly.  Qty: 30 tablet, Refills: 0             Current Facility-Administered Medications   Medication Dose Route Frequency Last Rate Last Dose   . acetaminophen (TYLENOL) tablet 650 mg  650 mg Oral Q4H PRN       . dextrose 50 % bolus 25 mL  25 mL Intravenous PRN       . glucagon (rDNA) (GLUCAGEN) injection 1 mg  1 mg Intramuscular PRN       . insulin aspart (NovoLOG) injection 1-4 Units  1-4 Units Subcutaneous QHS PRN       . insulin aspart (NovoLOG) injection 1-8 Units  1-8 Units Subcutaneous TID AC PRN       . ondansetron (ZOFRAN) injection 4 mg  4 mg Intravenous Q6H PRN       . pantoprazole (PROTONIX) 80 mg in sodium chloride 0.9 % 100 mL infusion  8 mg/hr Intravenous Continuous 10 mL/hr at 08/02/15 0442 8 mg/hr at 08/02/15 0442       Review  of Systems  Constitutional  Negative for fevers or weight loss   Skin  Negative  for rash   HENT  Negative for sore throat   Eyes  Negative for blurred vision   Cardiovascular  Negative for chest pain   Respiratory  Negative for SOB or cough   Gastrointestinal SEE HPI   Genitourinary  Negative for dysuria, hematuria, or frequency   Musculoskeletal  Negative for joint pain   Endo  Negative for diabetes   Heme  Negative for anemia   Neurological  Negative for syncope   Psych  Negative for depression       Physical Exam  BP 114/56 mmHg  Pulse 78  Temp(Src) 97.3 F (36.3 C) (Oral)  Resp 19  Ht 1.753 m (5\' 9" )  Wt 66.99 kg (147 lb 11 oz)  BMI 21.80 kg/m2  SpO2 98%    General Appearance:    no acute distress, appears stated age and looks comfortable   HEENT:    Normocephalic, without obvious abnormality, atraumatic, PERRL, sclera anicteric, oral mucosa pink/moist   Lungs:     Clear to auscultation bilaterally, no wheezing/rhonchi/rales    Heart:    Regular rate and rhythm, S1 and S2 normal, no murmur, rub or gallops appreciated   Abdomen:    soft, NT, ND, +BS, no hepatosplenomegaly or mass noted.    Rectal:   deferred    Extremities:   Extremities normal, atraumatic, no cyanosis or edema   Skin:   Skin color, texture, turgor normal, no rashes or lesions and no jaundice   Neurologic:   AAOx3, no focal deficits           Psychological: normal affect    Laboratory Data reviewed:      Recent Labs  Lab 08/01/15  1622   WBC 4.43   HGB 7.7*   HEMATOCRIT 24.7*   PLATELETS 218   MCV 91.1   NEUTROPHILS 65       Recent Labs  Lab 08/01/15  1622   SODIUM 142   POTASSIUM 4.8   CHLORIDE 109   CO2 23   BUN 21.0   CREATININE 1.9*   GLUCOSE 94   CALCIUM 9.2   PROTEIN, TOTAL 6.4   ALBUMIN 3.5   AST (SGOT) 15   ALT 7   ALKALINE PHOSPHATASE 58   BILIRUBIN, TOTAL 0.3     Glucose:    Recent Labs  Lab 08/01/15  1622   GLUCOSE 94           Radiological Imaging reviewed:  CT Head (08/01/15):  IMPRESSION:    No acute intracranial abnormality.

## 2015-08-02 NOTE — UM Notes (Signed)
Syncope - Clinical Indications for Admission to Inpatient Care by Mamie Nick, RN          Reviewed on 08/02/2015 by Mamie Nick, RN      Created Using Review Status Review Entered     [employer] QI Completed 08/02/2015     Criteria Set Name - Subset Criteria Status     Syncope - Clinical Indications for Admission to Inpatient Care Met                          Notes:           08/02/2015 8:22 AM by Ane Payment     Subject: Admission     ER HPI 08/01/15       " [name] is a 81 y.o. male BIBA after a syncopal episode that occurred while sitting in a chair outside after eating lunch JPTA. Pt states he felt dizzy after which he "lay down and passed out." Pt now c/o dizziness and a HA both of which he had p/t the syncopal episode. Also reports melena..."       VS: 97.8, 71, 97% RA, 18, 120/56, headache:8/10       CT of the head(-)       H/H: 7.7/24[recent value 9.9/31 last month]       Cr: 1.9[recent value: 0.9]       ER TXIVF 1 liter       Protonix 80mg  IV       Protonix gtt at 8mg /hr       Admitted Inpatient       PLAN       serial H/H       IVF       Protonix gtt       GI consult                     Admit to Inpatient (Order [id number]: [id number] Date: 08/01/2015 at 5

## 2015-08-02 NOTE — Plan of Care (Signed)
Problem: Safety  Goal: Patient will be free from injury during hospitalization  Outcome: Progressing  The patient and care giver's learning abilities have been assessed. Today's individualized plan of care to keep patient safe, assist patient to the bathroom, continue IVF, monitor H&H,  was discussed with the patient and care giver and agree to it. Patient and care giver demonstrates understanding of disease process, treatment plan, medications and consequences of noncompliance. All questions and concerns were addressed.     H&H is 7.7 - 24.9. No active bleeding noted. Patient able to use the bathroom with stand by assist. Hourly rounding done.

## 2015-08-02 NOTE — H&P (Signed)
[facility]  Office phone: [phone]   [place] [id number] or [phone]        ADMISSION HISTORY AND PHYSICAL EXAM    Date Time: 08/02/2015 3:41 PM  Patient Name: [name]  Attending Physician: Gean Quint, MD      Chief Complaint: Syncope    History of Present Illness:   Al Bracewell is a 81 y.o. male who presents to the hospital with syncope.  He said he was feeling dizzy and lightheaded, followed by loss of awareness.  He denies chest pain, shortness of breath or diaphoresis.  He notes black stool for the past week.  He was hospitalized here about a month ago with similar complaints and anemia.  He underwent EGD which showed erosive esophagitis.  Biopsy results are benign.  He denies hematemesis, abdominal pain or hematochezia.  Denies use of nonsteroidal anti-inflammatory drug. Physical exam unremarkable.  EKG normal sinus rhythm. Hemoglobin and hematocrit 7.7 and 24.  Hemoglobin of 9.2/28 on the time of discharge from last hospitalization.  He is admitted for further evaluation of possible ongoing upper gastrointestinal bleed.    Past Medical History:     Past Medical History   Diagnosis Date   . No diagnosis    . Diabetes mellitus    . Hyperlipidemia    . Cerebrovascular accident      03/2015       Review of Systems:       A ten point review of systems was performed and was negative except for the positives mentioned above.      Past Surgical History:     Past Surgical History   Procedure Laterality Date   . Appendectomy     . Egd, biopsy N/A 06/30/2015     Procedure: EGD, BIOPSY;  Surgeon: [name], MD;  Location: [facility];  Service: Gastroenterology;  Laterality: N/A;       Family History:   History reviewed. No pertinent family history.    Social History:     Social History     Social History   . Marital Status: Single     Spouse Name: N/A   . Number of Children: N/A   . Years of Education: N/A     Social History Main Topics   . Smoking status: Current Some Day Smoker      Types: Cigarettes   . Smokeless tobacco: Not on file      Comment: smokes 2-3 cigaretes wekly   . Alcohol Use: Yes      Comment: occassionally   . Drug Use: No   . Sexual Activity: Not on file     Other Topics Concern   . Not on file     Social History Narrative       Allergies:     Allergies   Allergen Reactions   . Penicillins        Medications:     Prescriptions prior to admission   Medication Sig   . aspirin 81 MG tablet Take 81 mg by mouth daily.   . brimonidine (ALPHAGAN P) 0.1 % Solution Place 1 drop into both eyes 2 (two) times daily.      Marland Kitchen glimepiride (AMARYL) 2 MG tablet Take 2 mg by mouth every morning before breakfast.   . metFORMIN (GLUCOPHAGE) 850 MG tablet Take 1,000 mg by mouth 2 (two) times daily with meals.      . pantoprazole (PROTONIX) 40 MG tablet Take 1 tablet (40 mg total) by  mouth 2 (two) times daily.   . pioglitazone (ACTOS) 45 MG tablet Take 45 mg by mouth daily.   . simvastatin (ZOCOR) 40 MG tablet Take 1 tablet (40 mg total) by mouth nightly.     Current Facility-Administered Medications   Medication Dose Route Frequency Provider Last Rate Last Dose   . acetaminophen (TYLENOL) tablet 650 mg  650 mg Oral Q4H PRN Gean Quint, MD       . dextrose 50 % bolus 25 mL  25 mL Intravenous PRN Gean Quint, MD       . glucagon (rDNA) (GLUCAGEN) injection 1 mg  1 mg Intramuscular PRN Gean Quint, MD       . insulin aspart (NovoLOG) injection 1-4 Units  1-4 Units Subcutaneous QHS PRN Gean Quint, MD       . insulin aspart (NovoLOG) injection 1-8 Units  1-8 Units Subcutaneous TID AC PRN Gean Quint, MD       . ondansetron [facility]) injection 4 mg  4 mg Intravenous Q6H PRN Gean Quint, MD       . pantoprazole (PROTONIX) 80 mg in sodium chloride 0.9 % 100 mL infusion  8 mg/hr Intravenous Continuous Sharyne Richters, MD 10 mL/hr at 08/02/15 0442 8 mg/hr at 08/02/15 0442       Physical Exam:     Filed Vitals:    08/02/15 1100   BP: 109/56   Pulse: 74   Temp: 97.4 F (36.3 C)    Resp: 18   SpO2: 99%       General appearance - alert and in no distress  Head - Normocephalic, atraumatic  Eyes - pupils equal and reactive, extraocular eye movements intact  Nose - normal and patent, no erythema, discharge or polyps  Mouth - mucous membranes moist, pharynx normal without lesions.  Neck - supple, no thyromegaly, JVD  Lymphatics - no palpable lymphadenopathy  Chest - clear to auscultation, no wheezes, rales or rhonchi, symmetric air entry  Heart - normal rate, regular rhythm, normal S1, S2, no murmurs, rubs, clicks or gallops  Abdomen - soft, nontender, nondistended, no masses or organomegaly  Musculoskeletal - no joint tenderness, deformity or swelling  Extremities - peripheral pulses normal, no pedal edema, no clubbing or cyanosis  Skin - normal coloration and turgor, no rashes, no suspicious skin lesions noted  Neurological - alert, oriented, cranial nerves 2-12 intact, No Extremity weakness     Labs:     Results     Procedure Component Value Units Date/Time    Hemoglobin and hematocrit, blood [[id number]]  (Abnormal) Collected:  08/02/15 1505    Specimen Information:  Blood Updated:  08/02/15 1517     Hgb 7.7 (L) g/dL      Hematocrit 95.1 (L) %     Glucose Whole Blood - POCT [[id number]]  (Abnormal) Collected:  08/02/15 0711     POCT - Glucose Whole blood 52 (L) mg/dL Updated:  01/60/10 9323    UA, Reflex to Microscopic [[id number]] Collected:  08/02/15 0121    Specimen Information:  Urine Updated:  08/02/15 0141     Urine Type Clean Catch      Color, UA Straw      Clarity, UA Clear      Specific Gravity UA 1.011      Urine pH 6.0      Leukocyte Esterase, UA Negative      Nitrite, UA Negative      Protein, UR Negative      Glucose,  UA Negative      Ketones UA Negative      Urobilinogen, UA Negative mg/dL      Bilirubin, UA Negative      Blood, UA Negative     Glucose Whole Blood - POCT [[id number]] Collected:  08/01/15 2220     POCT - Glucose Whole blood 93 mg/dL Updated:  19/14/78 2956     Troponin I [[id number]] Collected:  08/01/15 1622    Specimen Information:  Blood Updated:  08/01/15 1718     Troponin I <0.01 ng/mL     Comprehensive Metabolic Panel (CMP) [[id number]]  (Abnormal) Collected:  08/01/15 1622    Specimen Information:  Blood Updated:  08/01/15 1711     Glucose 94 mg/dL      BUN 41.3 mg/dL      Creatinine 1.9 (H) mg/dL      Sodium 244 mEq/L      Potassium 4.8 mEq/L      Chloride 109 mEq/L      CO2 23 mEq/L      Calcium 9.2 mg/dL      Protein, Total 6.4 g/dL      Albumin 3.5 g/dL      AST (SGOT) 15 U/L      ALT 7 U/L      Alkaline Phosphatase 58 U/L      Bilirubin, Total 0.3 mg/dL      Globulin 2.9 g/dL      Albumin/Globulin Ratio 1.2      Anion Gap 10.0     Hemolysis index [[id number]] Collected:  08/01/15 1622     Hemolysis Index 7 Updated:  08/01/15 1711    GFR [[id number]] Collected:  08/01/15 1622     EGFR 41.3 Updated:  08/01/15 1711    CBC and differential [[id number]]  (Abnormal) Collected:  08/01/15 1622    Specimen Information:  Blood from Blood Updated:  08/01/15 1653     WBC 4.43 x10 3/uL      Hgb 7.7 (L) g/dL      Hematocrit 43.3 (L) %      Platelets 218 x10 3/uL      RBC 2.71 (L) x10 6/uL      MCV 91.1 fL      MCH 28.4 pg      MCHC 31.2 (L) g/dL      RDW 14 %      MPV 10.3 fL      Neutrophils 65 %      Lymphocytes Automated 26 %      Monocytes 7 %      Eosinophils Automated 1 %      Basophils Automated 0 %      Immature Granulocyte 0 %      Nucleated RBC 0 /100 WBC      Neutrophils Absolute 2.88 x10 3/uL      Abs Lymph Automated 1.16 x10 3/uL      Abs Mono Automated 0.32 x10 3/uL      Abs Eos Automated 0.05 x10 3/uL      Absolute Baso Automated 0.02 x10 3/uL      Absolute Immature Granulocyte 0.00 x10 3/uL             Rads:   Ct Head Without Contrast    08/01/2015  CLINICAL INDICATION:  Headache, syncope TECHNIQUE:  Axial noncontrast CT images were obtained from the skull base to the vertex. A combination of a automatic exposure control, adjustment of the mA and/or kV  according  to patient size and/or use of iterative reconstruction technique was utilized. COMPARISON:  05/21/2015 INTERPRETATION: There is no acute intracranial hemorrhage or extra axial fluid collection. There is stable periventricular low-attenuation, consistent with chronic small vessel ischemic disease. The ventricles have a normal configuration. There is no midline shift or mass effect. The gray-white matter differentiation is normal. Visualized paranasal sinuses and mastoid air cells are clear.        08/01/2015    No acute intracranial abnormality. Wyatt Portela, MD 08/01/2015 5:17 PM           Assessment:   1. Syncope, likely vagal, patient has been constipated for 7 days.  Anemia could contribute for his syncope.  2. Melena-concern for upper gastrointestinal bleed  3. Erosive esophagitis-EGD on 07/01/15  4. Anemia, likely chronic blood loss  5. Type 2 diabetes  6. Hyperlipidemia  7. History of CVA    Plan:   1. Protonix,  2. Cardiac enzymes, telemetry for next 24 hours.  Monitor for any arrhythmia  3. Repeat hemoglobin and hematocrit this afternoon   4. advance diet if stable hemoglobin and hematocrit  5. Gastrointestinal consult appreciated  6. Anticipate no EGD if no active bleeding  7. Continue home medications as appropriate  8. SCD for deep vein thrombosis prophylaxis  9. PT, OT evaluation    Signed by: Eric Form  Pager: [phone]

## 2015-08-03 LAB — CBC
Hematocrit: 24.3 % — ABNORMAL LOW (ref 42.0–52.0)
Hgb: 7.5 g/dL — ABNORMAL LOW (ref 13.0–17.0)
MCH: 27.9 pg — ABNORMAL LOW (ref 28.0–32.0)
MCHC: 30.9 g/dL — ABNORMAL LOW (ref 32.0–36.0)
MCV: 90.3 fL (ref 80.0–100.0)
MPV: 10.5 fL (ref 9.4–12.3)
Nucleated RBC: 0 /100 WBC (ref 0–1)
Platelets: 220 10*3/uL (ref 140–400)
RBC: 2.69 10*6/uL — ABNORMAL LOW (ref 4.70–6.00)
RDW: 15 % (ref 12–15)
WBC: 4.21 10*3/uL (ref 3.50–10.80)

## 2015-08-03 LAB — HEMOLYSIS INDEX: Hemolysis Index: -2 — ABNORMAL LOW (ref 0–18)

## 2015-08-03 LAB — ECG 12-LEAD
Atrial Rate: 75 {beats}/min
P Axis: 39 degrees
P-R Interval: 184 ms
Q-T Interval: 372 ms
QRS Duration: 88 ms
QTC Calculation (Bezet): 415 ms
R Axis: 50 degrees
T Axis: 50 degrees
Ventricular Rate: 75 {beats}/min

## 2015-08-03 LAB — BASIC METABOLIC PANEL
Anion Gap: 7 (ref 5.0–15.0)
BUN: 11 mg/dL (ref 9.0–28.0)
CO2: 23 mEq/L (ref 22–29)
Calcium: 8.6 mg/dL (ref 7.9–10.2)
Chloride: 109 mEq/L (ref 100–111)
Creatinine: 1 mg/dL (ref 0.7–1.3)
Glucose: 95 mg/dL (ref 70–100)
Potassium: 4.5 mEq/L (ref 3.5–5.1)
Sodium: 139 mEq/L (ref 136–145)

## 2015-08-03 LAB — GLUCOSE WHOLE BLOOD - POCT
Whole Blood Glucose POCT: 88 mg/dL (ref 70–100)
Whole Blood Glucose POCT: 187 mg/dL — ABNORMAL HIGH (ref 70–100)
Whole Blood Glucose POCT: 142 mg/dL — ABNORMAL HIGH (ref 70–100)

## 2015-08-03 LAB — GFR: EGFR: >60

## 2015-08-03 MED ORDER — PEG 3350-KCL-NABCB-NACL-NASULF 236 G PO SOLR
2000.0000 mL | Freq: Once | ORAL | Status: AC
Start: 2015-08-04 — End: 2015-08-04
  Administered 2015-08-04: 2000 mL via ORAL
  Filled 2015-08-03: qty 4000

## 2015-08-03 MED ORDER — PEG 3350-KCL-NABCB-NACL-NASULF 236 G PO SOLR
2000.0000 mL | Freq: Once | ORAL | Status: AC
Start: 2015-08-03 — End: 2015-08-03
  Administered 2015-08-03: 2000 mL via ORAL
  Filled 2015-08-03: qty 4000

## 2015-08-03 NOTE — Plan of Care (Signed)
Problem: Health Promotion  Goal: Knowledge - disease process  Extent of understanding conveyed about a specific disease process.   Outcome: Progressing  The patient's learning abilities have been assessed. Today's individualized plan of care to monitor vital signs, cardiac activities, Labs, IV Protonix, IV fluid and safety precautions was discussed with the patient and agree to it. Patient demonstrates understanding of disease process, treatment plan, medications and consequences of noncompliance. All questions and concerns were addressed.

## 2015-08-03 NOTE — Progress Notes (Signed)
Progress Note  [facility] [id number]    08/03/2015  GI Attending Note  Patient seen and evaluated. Agree with note as detailed. Below. Will plan to risk stratify for continued anticoagulation with an EGD and colonoscopy tomorrow.     Bonnita Levan II, MD    Assessment:  Syncope  ?UGIB - c/o hard black stool. Recently had EGD in 06/2015 revealing erosive gastritis.   Constipation  Anemia  H/o CVA in 03/2015 - on ASA daily outpatient  HLD  DM    [name] is a 81 y.o. male with multiple co-morbidities presented with c/o dizziness and passing hard black stool. Recently hospitalized in 8/28-8/30 with similar complaints. During that time he had an EGD done revealing erosive esophagitis. No s/sx of GIB since admission. H/H remains stable. Will plan for EGD +/- colon tomorrow.     Plan:  1) Monitor H/H and s/sx of GIB, transfuse PRN  2) PPI BID  3) Plan for possible repeat EGD +/- colonoscopy, pending pt's clinical course.  4) Clear liquid diet, NPO at midnight  5) Ordered for Golytely, split dosing for colon prep for possible colonoscopy tomorrow.   6) Continue supportive care    This case will be discussed with Dr. Maple Hudson.  Geralynn Ochs, NP   11:17 AM    Subjective:  No new events overnight.   Pt reported having a BM yesterday with brown stool.   Denies any abdominal pain, nausea, or vomiting.   No overt s/sx of GIB.     Objective:    Current Facility-Administered Medications   Medication Dose Route Frequency Last Rate Last Dose   . 0.9%  NaCl infusion   Intravenous Continuous 75 mL/hr at 08/03/15 0625     . acetaminophen (TYLENOL) tablet 650 mg  650 mg Oral Q4H PRN   650 mg at 08/03/15 0625   . dextrose 50 % bolus 25 mL  25 mL Intravenous PRN       . glucagon (rDNA) (GLUCAGEN) injection 1 mg  1 mg Intramuscular PRN       . insulin aspart (NovoLOG) injection 1-4 Units  1-4 Units Subcutaneous QHS PRN   1 Units at 08/02/15 2202   . insulin aspart (NovoLOG) injection 1-8 Units  1-8 Units Subcutaneous TID AC  PRN       . ondansetron (ZOFRAN) injection 4 mg  4 mg Intravenous Q6H PRN       . pantoprazole (PROTONIX) injection 40 mg  40 mg Intravenous BID   40 mg at 08/03/15 1004       Physical Exam:  BP 115/57 mmHg  Pulse 72  Temp(Src) 95 F (35 C) (Oral)  Resp 12  Ht 1.753 m (5\' 9" )  Wt 67.314 kg (148 lb 6.4 oz)  BMI 21.90 kg/m2  SpO2 100%    General Appearance: NAD, comfortable, AAOx3  Abd: soft, NT, ND, BS+, no masses appreciated       Recent Labs  Lab 08/03/15  0421 08/02/15  1505 08/01/15  1622   WBC 4.21 4.21 4.43   HGB 7.5* 7.7*  7.7* 7.7*   HEMATOCRIT 24.3* 24.8*  24.9* 24.7*   PLATELETS 220 214 218   MCV 90.3 90.8 91.1   NEUTROPHILS  --   --  65       Recent Labs  Lab 08/03/15  0421 08/01/15  1622   SODIUM 139 142   POTASSIUM 4.5 4.8   CHLORIDE 109 109   CO2 23 23  BUN 11.0 21.0   CREATININE 1.0 1.9*   GLUCOSE 95 94   CALCIUM 8.6 9.2   PROTEIN, TOTAL  --  6.4   ALBUMIN  --  3.5   AST (SGOT)  --  15   ALT  --  7   ALKALINE PHOSPHATASE  --  58   BILIRUBIN, TOTAL  --  0.3     Glucose:    Recent Labs  Lab 08/03/15  0421 08/01/15  1622   GLUCOSE 95 94

## 2015-08-03 NOTE — Progress Notes (Signed)
[facility]  Office phone: [phone]   [place] [id number] or [phone]        Daily PROGRESS NOTE    Date Time: 08/03/2015 1:11 PM  Patient Name: [name], [name]  Patient Status: Inpatient  Hospital Day: 2    Assessment:   1. Syncope, likely vagal, patient has been constipated for 7 days. Anemia could contribute for his syncope.  2. Melena-concern for upper gastrointestinal bleed  3. Erosive esophagitis-EGD on 07/01/15  4. Anemia, likely chronic blood loss  5. AKI  6. Type 2 diabetes  7. Hyperlipidemia  8. History of CVA    Plan:   1. Recommend colonoscopy, as he never had colonoscopy and part of the anemia could be due to lower gastrointestinal blood loss.  I do not think EGD will be of high yield, given his  recent findings, consider capsule Endoscopy If Colonoscopy Is Nonrevealing  2. Patient does not have melena.  At this point had a normal color stool yesterday  3. Clear liquid diet  4. Continue monitoring complete blood count  5. Acute kidney injury, resolving, may start metformin on discharge  6. Discussed with gastrointestinal NP, nurse and patient    Subjective:     10 point Review of Systems - Negative except for the Positives mentioned above      Medications:     Current Facility-Administered Medications   Medication Dose Route Frequency   . pantoprazole  40 mg Intravenous BID       acetaminophen 650 mg Q4H PRN   dextrose 25 mL PRN   glucagon (rDNA) 1 mg PRN   insulin aspart 1-4 Units QHS PRN   insulin aspart 1-8 Units TID AC PRN   ondansetron 4 mg Q6H PRN        Physical Exam:     Filed Vitals:    08/03/15 1226   BP: 108/54   Pulse: 81   Temp: 97.1 F (36.2 C)   Resp: 12   SpO2: 97%       Intake and Output Summary (Last 24 hours) at Date Time    Intake/Output Summary (Last 24 hours) at 08/03/15 1311  Last data filed at 08/03/15 0630   Gross per 24 hour   Intake   1200 ml   Output   2400 ml   Net  -1200 ml       Physical Exam   Constitutional: He is oriented to person, place,  and time. He appears well-developed and well-nourished.   HENT:   Head: Normocephalic and atraumatic.   Eyes: Pupils are equal, round, and reactive to light.   Neck: No tracheal deviation present. No thyromegaly present.   Cardiovascular: Normal rate and normal heart sounds.    Pulmonary/Chest: No respiratory distress. He has no wheezes.   Abdominal: He exhibits no distension. There is no tenderness.   Musculoskeletal: He exhibits no edema or tenderness.   Neurological: He is alert and oriented to person, place, and time.           Labs:     Results     Procedure Component Value Units Date/Time    Glucose Whole Blood - POCT [[id number]]  (Abnormal) Collected:  08/03/15 1225     POCT - Glucose Whole blood 187 (H) mg/dL Updated:  16/60/63 0160    Glucose Whole Blood - POCT [[id number]] Collected:  08/03/15 0803     POCT - Glucose Whole blood 88 mg/dL Updated:  32/20/25 4270    CBC without differential [  [id number]]  (Abnormal) Collected:  08/03/15 0421    Specimen Information:  Blood from Blood Updated:  08/03/15 0616     WBC 4.21 x10 3/uL      Hgb 7.5 (L) g/dL      Hematocrit 24.4 (L) %      Platelets 220 x10 3/uL      RBC 2.69 (L) x10 6/uL      MCV 90.3 fL      MCH 27.9 (L) pg      MCHC 30.9 (L) g/dL      RDW 15 %      MPV 10.5 fL      Nucleated RBC 0 /100 WBC     Basic Metabolic Panel [[id number]] Collected:  08/03/15 0421    Specimen Information:  Blood Updated:  08/03/15 0605     Glucose 95 mg/dL      BUN 64.4 mg/dL      Creatinine 1.0 mg/dL      Calcium 8.6 mg/dL      Sodium 034 mEq/L      Potassium 4.5 mEq/L      Chloride 109 mEq/L      CO2 23 mEq/L      Anion Gap 7.0     Hemolysis index [[id number]]  (Abnormal) Collected:  08/03/15 0421     Hemolysis Index -2 (L) Updated:  08/03/15 0605    GFR [[id number]] Collected:  08/03/15 0421     EGFR >60.0 Updated:  08/03/15 0605    Glucose Whole Blood - POCT [[id number]] Collected:  08/02/15 1109     POCT - Glucose Whole blood 93 mg/dL Updated:  30/16/01 0932    Glucose Whole  Blood - POCT [[id number]]  (Abnormal) Collected:  08/02/15 2141     POCT - Glucose Whole blood 170 (H) mg/dL Updated:  54/27/06 2376    Troponin I [[id number]] Collected:  08/02/15 1703    Specimen Information:  Blood Updated:  08/02/15 1732     Troponin I <0.01 ng/mL     Hemoglobin and hematocrit, blood [[id number]]  (Abnormal) Collected:  08/02/15 1505    Specimen Information:  Blood Updated:  08/02/15 1705     Hgb 7.7 (L) g/dL      Hematocrit 69.4 (L) %     CBC without differential [[id number]]  (Abnormal) Collected:  08/02/15 1505    Specimen Information:  Blood from Blood Updated:  08/02/15 1654     WBC 4.21 x10 3/uL      Hgb 7.7 (L) g/dL      Hematocrit 00.9 (L) %      Platelets 214 x10 3/uL      RBC 2.73 (L) x10 6/uL      MCV 90.8 fL      MCH 28.2 pg      MCHC 31.0 (L) g/dL      RDW 14 %      MPV 10.6 fL      Nucleated RBC 0 /100 WBC     Glucose Whole Blood - POCT [[id number]]  (Abnormal) Collected:  08/02/15 1605     POCT - Glucose Whole blood 113 (H) mg/dL Updated:  16/96/78 9381            Rads:     Ct Head Without Contrast    08/01/2015  CLINICAL INDICATION:  Headache, syncope TECHNIQUE:  Axial noncontrast CT images were obtained from the skull base to the vertex. A combination of a automatic exposure control, adjustment of the mA and/or kV  according to patient size and/or use of iterative reconstruction technique was utilized. COMPARISON:  05/21/2015 INTERPRETATION: There is no acute intracranial hemorrhage or extra axial fluid collection. There is stable periventricular low-attenuation, consistent with chronic small vessel ischemic disease. The ventricles have a normal configuration. There is no midline shift or mass effect. The gray-white matter differentiation is normal. Visualized paranasal sinuses and mastoid air cells are clear.        08/01/2015    No acute intracranial abnormality. Wyatt Portela, MD 08/01/2015 5:17 PM         Signed by: Eric Form, MD  Pager: [phone]

## 2015-08-03 NOTE — Progress Notes (Signed)
Nutritional Support Services  Nutrition Assessment    Saxon Barich 81 y.o. male   MRN: [id number]    Summary of Nutrition Recommendations:  1. Continue mech soft diet as tolerated  2. If po intake <75% at mealtimes, include Glucerna BID with meals  Each Glucerna shake provides 220 kcal and 10 gm protein.  3. Include MVI   4. Monitor and treat BS levels as needed  -----------------------------------------------------------------------------------------------------------------                                                      Assessment Data:   Referral Source: RN Screen  Reason for Referral: unintentional weight loss      Nutrition: Pt reports "normal, regular foods" are eating at home like oatmeal, veggies, chicken or fish. Pt states since esophagus issues began, he has started to eat lighter foods and small portions and feel this has contributed to his ~7-8 lb weight loss (reported UBW: 155 lb). Pt denies difficulty swallowing; states he has been avoiding heaving foods like steaks d/t GI distress. Pt reports he has poor water intake at home; drinks a maximum of 2 bottles of water per day, sometimes coffee with milk. 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   Hospital Admission: Syncopal episode; per H&P (08/02/15): admitted for further evaluation of possible ongoing upper gastrointestinal bleed. He was hospitalized here about a month ago with similar complaints and anemia. He underwent EGD which showed erosive esophagitis.  Medical Hx:  has a past medical history of No diagnosis; Diabetes mellitus; Hyperlipidemia; and Cerebrovascular accident.  PSH: has past surgical history that includes Appendectomy and EGD, BIOPSY (N/A, 06/30/2015).   Social Hx: current some day smoker; 2-3 cigarettes weekly; occasional alcohol use     Orders Placed This Encounter   Procedures   . Diet Mechanical Soft     Intake: good per patient reports      ANTHROPOMETRIC  Anthropometrics  Height: 175.3 cm (5\' 9" )  Weight: 67.314 kg (148 lb 6.4 oz)  Weight Change: 0.48  IBW/kg (Calculated) Male: 72.75 kg  BMI (calculated): 21.9  Wt Readings from Last 10 Encounters:   08/03/15 67.314 kg (148 lb 6.4 oz)   07/01/15 70.8 kg (156 lb 1.4 oz)   05/21/15 70.308 kg (155 lb)   03/23/15 64.456 kg (142 lb 1.6 oz)   03/05/15 68.493 kg (151 lb)   Indicative of 5% weight loss in 1 month = significant     Physical Assessment:   Head: no signs of malnutrition observed by RD   Upper Body: no signs of malnutrition observed by RD   Lower Body: no signs of malnutrition observed by RD   Edema: none   Skin: WDL per doc flow   GI function: per RN note (08/02/15): nausea upon admission; per GI note (08/02/15): passing hard black stool; per H&P (08/02/2015): patient has been constipated for 7 days.     ESTIMATED NEEDS  Estimated Energy Needs  Total Energy Estimated Needs: 1680-2020 kcals  Method for Estimating Needs: 25-30 kcals/kg actual body weight (non-obese/non-critical)    Estimated Protein Needs  Total Protein Estimated Needs: 80-100 grams  Method for Estimating Needs: 1.2-1.5 grams/kg actual body weight     Fluid Needs  Total Fluid Estimated Needs: 1680 ml  Method for Estimating Needs:  25 ml/kg (older adult fluid recommendations)     Pertinent Medications: glucagen, novolog   IVF:    . sodium chloride 75 mL/hr at 08/03/15 3086     Pertinent labs: POCT Glucose: 88-170  Learning Needs: yes  Nutrition Assessment Summary: Pt confirmed that he was constipation at home prior to admission. Dietary recall reflects fair fiber intake but poor water intake. Verbally educated patient on recommended daily water intake to maintain regular bowel movements. Pt verbalized understanding.                                                               Nutrition Diagnosis      Unintentional weight loss related to decreased ability to consume sufficient energy as evidenced by patient reports of alteration of  feeding schedule and food choices to promote comfort as well as 5% weight loss from UBW in one month.     Altered GI function related to alteration in GI tract structure (erosive esophagitis) as evidenced by pt reports of GI discomfort at home and avoidance/limitation of specific foods ("heavy foods").                                                               Intervention   Nutrition recommendation -   Goal: Pt will consume >75% of meals by next RD visit   1. Continue mech soft diet as tolerated  2. If po intake <75% at mealtimes, include Glucerna BID with meals  Each Glucerna shake provides 220 kcal and 10 gm protein.  3. Include MVI   4. Monitor and treat BS levels as needed                                                             Monitoring     PO intake, BS levels, labs, GI fxn                                                         Evaluation   Nutrition Risk Level: Moderate (will follow up within 7 days and PRN)     Carmelia Roller, RDN  Office [phone]

## 2015-08-04 ENCOUNTER — Encounter
Admission: EM | Disposition: A | Discharge status: Home / Self Care | Payer: Self-pay | Source: Home / Self Care | Attending: Internal Medicine

## 2015-08-04 ENCOUNTER — Inpatient Hospital Stay: Payer: Medicare (Managed Care) | Admitting: Certified Registered"

## 2015-08-04 DIAGNOSIS — D124 Benign neoplasm of descending colon: Secondary | ICD-10-CM

## 2015-08-04 DIAGNOSIS — D122 Benign neoplasm of ascending colon: Secondary | ICD-10-CM

## 2015-08-04 HISTORY — PX: EGD, COLONOSCOPY: SHX3799

## 2015-08-04 LAB — CBC
Hematocrit: 24.8 % — ABNORMAL LOW (ref 42.0–52.0)
Hgb: 7.8 g/dL — ABNORMAL LOW (ref 13.0–17.0)
MCH: 28.1 pg (ref 28.0–32.0)
MCHC: 31.5 g/dL — ABNORMAL LOW (ref 32.0–36.0)
MCV: 89.2 fL (ref 80.0–100.0)
MPV: 10.4 fL (ref 9.4–12.3)
Nucleated RBC: 0 /100 WBC (ref 0–1)
Platelets: 214 10*3/uL (ref 140–400)
RBC: 2.78 10*6/uL — ABNORMAL LOW (ref 4.70–6.00)
RDW: 14 % (ref 12–15)
WBC: 4.15 10*3/uL (ref 3.50–10.80)

## 2015-08-04 LAB — GLUCOSE WHOLE BLOOD - POCT
Whole Blood Glucose POCT: 81 mg/dL (ref 70–100)
Whole Blood Glucose POCT: 91 mg/dL (ref 70–100)
Whole Blood Glucose POCT: 153 mg/dL — ABNORMAL HIGH (ref 70–100)

## 2015-08-04 SURGERY — EGD, COLONOSCOPY
Anesthesia: Anesthesia General | Site: Abdomen | Wound class: Clean Contaminated

## 2015-08-04 MED ORDER — LIDOCAINE HCL 2 % IJ SOLN
INTRAMUSCULAR | Status: DC | PRN
Start: 2015-08-04 — End: 2015-08-04
  Administered 2015-08-04: 100 mg via INTRAVENOUS

## 2015-08-04 MED ORDER — PROPOFOL INFUSION 10 MG/ML
INTRAVENOUS | Status: DC | PRN
Start: 2015-08-04 — End: 2015-08-04
  Administered 2015-08-04: 140 ug/kg/min via INTRAVENOUS

## 2015-08-04 MED ORDER — PROPOFOL INFUSION 10 MG/ML
INTRAVENOUS | Status: DC | PRN
Start: 2015-08-04 — End: 2015-08-04
  Administered 2015-08-04: 20 mg via INTRAVENOUS
  Administered 2015-08-04: 60 mg via INTRAVENOUS
  Administered 2015-08-04 (×2): 20 mg via INTRAVENOUS

## 2015-08-04 MED ORDER — PROPOFOL 10 MG/ML IV EMUL (WRAP)
INTRAVENOUS | Status: AC
Start: 2015-08-04 — End: ?
  Filled 2015-08-04: qty 20

## 2015-08-04 MED ORDER — LACTATED RINGERS IV SOLN
INTRAVENOUS | Status: DC
Start: 2015-08-04 — End: 2015-08-05

## 2015-08-04 MED ORDER — LIDOCAINE HCL (PF) 2 % IJ SOLN
INTRAMUSCULAR | Status: AC
Start: 2015-08-04 — End: ?
  Filled 2015-08-04: qty 5

## 2015-08-04 MED ORDER — PROPOFOL 10 MG/ML IV EMUL (WRAP)
INTRAVENOUS | Status: AC
Start: 2015-08-04 — End: ?
  Filled 2015-08-04: qty 50

## 2015-08-04 SURGICAL SUPPLY — 19 items
CANISTER 1000CC (Procedure Accessories) ×2 IMPLANT
GOWN ISO YELLOW UNIVERSAL (Gown) ×4 IMPLANT
KIT CARRY ON ENDO PROCEDURE (Kits) ×2 IMPLANT
KIT CARRY ON ENDO PROCEDURE CPK4557000001 (Kits) IMPLANT
KIT UNIVERSAL IRRIGATION SOL (Kits) ×2 IMPLANT
SNARE THROW SENS SHRT STD OVA (Endoscopic Supplies) ×1 IMPLANT
SOFT-CUF 2T ADULT SUB-MIN (Cuff) ×2 IMPLANT
SOLN LUBRICATING JELLY 4.25OZ (Irrigation Solutions) ×2 IMPLANT
SPONGE GAUZE L4 IN X W4 IN 16 PLY (Dressing) ×10 IMPLANT
SPONGE GAUZE L4 IN X W4 IN 16 PLY MAXIMUM ABSORBENT USP TYPE VII (Dressing) ×10 IMPLANT
SPONGE GZE CTTN CRTY 4X4IN LF NS 16 PLY (Dressing) ×10
SYRINGE 50 ML GRADUATE NONPYROGENIC DEHP (Syringes, Needles) ×1 IMPLANT
SYRINGE 50 ML GRADUATE NONPYROGENIC DEHP FREE PVC FREE BD MEDICAL (Syringes, Needles) ×1 IMPLANT
SYRINGE MED 50ML LF STRL GRAD N-PYRG (Syringes, Needles) ×1
WATER STERILE PLASTIC POUR BOTTLE 1000 (Irrigation Solutions) ×1 IMPLANT
WATER STERILE PLASTIC POUR BOTTLE 1000 ML (Irrigation Solutions) ×1 IMPLANT
WATER STERILE PLASTIC POUR BOTTLE 250 ML (Irrigation Solutions) ×1 IMPLANT
WATER STRL 1000ML LF PLS PR BTL (Irrigation Solutions) ×1
WATER STRL 250ML LF PLS PR BTL (Irrigation Solutions) ×1

## 2015-08-04 NOTE — Progress Notes (Signed)
Progress Note  [facility] [id number]    08/04/2015      Assessment:  Syncope/GIB - c/o hard black stool. Recently had EGD in 06/2015 revealing erosive gastritis. EGD/colon today unrevealing.   Constipation  Anemia H/H overall stable.   H/o CVA in 03/2015 - on ASA daily outpatient  HLD  DM    [name] is a 81 y.o. male with multiple co-morbidities presented with c/o dizziness and passing hard black stool. Recently hospitalized in 8/28-8/30 with similar complaints. During that time he had an EGD done revealing erosive esophagitis.   EGD/colon done today for evaluation: normal EGD. Colon with pan-diverticulosis, 3mm polyp in ascending, ascending lipoma, 3mm sigmoid polyp, internal hemorrhoids. No obvious source identified.     Plan:  Diet as tolerated for now.   No obvious source of anemia identified on EGD/colon.   If active bleeding, consider repeat "second look" EGD/colon vs pillcam.   Monitor H/H and for s/s GIB, transfuse prn.   Supportive care.     This case will be discussed with Dr. Cam Hai.  Cerenity Goshorn Molinda Bailiff, PA   4:50 PM    Subjective:  Had EGD/colon done today, no obvious source of anemia identified. Ate post procedure. Feeling fine. No signs of bleeding. Frustrated that no source found.     Objective:    Current Facility-Administered Medications   Medication Dose Route Frequency Last Rate Last Dose   . 0.9%  NaCl infusion   Intravenous Continuous 75 mL/hr at 08/04/15 1132     . acetaminophen (TYLENOL) tablet 650 mg  650 mg Oral Q4H PRN   650 mg at 08/03/15 0625   . dextrose 50 % bolus 25 mL  25 mL Intravenous PRN       . glucagon (rDNA) (GLUCAGEN) injection 1 mg  1 mg Intramuscular PRN       . insulin aspart (NovoLOG) injection 1-4 Units  1-4 Units Subcutaneous QHS PRN   1 Units at 08/02/15 2202   . insulin aspart (NovoLOG) injection 1-8 Units  1-8 Units Subcutaneous TID AC PRN   1 Units at 08/03/15 1316   . lactated ringers infusion   Intravenous Continuous   Stopped at 08/04/15 1349   .  ondansetron (ZOFRAN) injection 4 mg  4 mg Intravenous Q6H PRN       . pantoprazole (PROTONIX) injection 40 mg  40 mg Intravenous BID   40 mg at 08/04/15 1021       Physical Exam:  BP 170/73 mmHg  Pulse 67  Temp(Src) 97.6 F (36.4 C) (Temporal Artery)  Resp 18  Ht 1.753 m (5\' 9" )  Wt 73.573 kg (162 lb 3.2 oz)  BMI 23.94 kg/m2  SpO2 99%    General Appearance: NAD, comfortable, AAOx3  Abd: soft, NT, ND, BS+      Recent Labs  Lab 08/04/15  0408 08/03/15  0421 08/02/15  1505 08/01/15  1622   WBC 4.15 4.21 4.21 4.43   HGB 7.8* 7.5* 7.7*  7.7* 7.7*   HEMATOCRIT 24.8* 24.3* 24.8*  24.9* 24.7*   PLATELETS 214 220 214 218   MCV 89.2 90.3 90.8 91.1   NEUTROPHILS  --   --   --  65       Recent Labs  Lab 08/03/15  0421 08/01/15  1622   SODIUM 139 142   POTASSIUM 4.5 4.8   CHLORIDE 109 109   CO2 23 23   BUN 11.0 21.0   CREATININE 1.0 1.9*   GLUCOSE 95  94   CALCIUM 8.6 9.2   PROTEIN, TOTAL  --  6.4   ALBUMIN  --  3.5   AST (SGOT)  --  15   ALT  --  7   ALKALINE PHOSPHATASE  --  58   BILIRUBIN, TOTAL  --  0.3     Glucose:    Recent Labs  Lab 08/03/15  0421 08/01/15  1622   GLUCOSE 95 94

## 2015-08-04 NOTE — Anesthesia Preprocedure Evaluation (Signed)
Anesthesia Evaluation    AIRWAY    Mallampati: III    TM distance: >3 FB  Neck ROM: full  Mouth Opening:full   CARDIOVASCULAR    cardiovascular exam normal       DENTAL    no notable dental hx     PULMONARY    pulmonary exam normal     OTHER FINDINGS                      Anesthesia Plan    ASA 3     general               (The most common side effects from anesthesia are nausea, vomiting, sore throat.  Uncommon side effects include but not limited to  injury to eyes, lips, teeth, gums, vocal cords, allergic rections, nerve injuries. In addition there are side effects associated with your medical conditions.    Answered all questions to patient's satisfaction.  Pt understands and wishes to proceed.    Garris Melhorn Robert Ta Fair, MD  05/02/2012 )      intravenous induction           Post op pain management: per surgeon    informed consent obtained    Plan discussed with CRNA.

## 2015-08-04 NOTE — Plan of Care (Signed)
Problem: Safety  Goal: Patient will be free from injury during hospitalization  The pt is rounded on hourly and is assisted to the bathroom by staff due to his history of syncope.     Problem: Altered GI Function  Goal: Elimination patterns are normal or improving  The pt has active bowel sounds in all four quadrants and was experiencing watery, liquid consistency stool due to his pre-operative ingestion of Go-Lytely.  Goal: Nutritional intake is adequate  The pt has been removed off of NPO and has been ordered a consistent carbohydrate diet. The pt consumed 100% of his lunch after his EGD and colonsocopy.

## 2015-08-04 NOTE — Progress Notes (Signed)
PROGRESS NOTE                                                 Gean Quint MD, [place], CMD                                                             [phone]    Date Time: 08/04/2015 2:35 PM  Patient Name: [name],[name]          Assessment:   Syncope  Black stools for one week per pt  Acute on chronica anemia  Erosive esophagitis-EGD on 07/01/15  DM  HLD  Hx cva on ASA daily in outpatient        Plan:   GI following, colonoscopy neg for ulcers  Cont with protonix BID  Am labs  Monitor H&H    DVT prophylaxis, no meds 2ary to acute anemia and poss GI bleed  GI  Prophylaxis, protonix   D/W RN, pt   Reason for contd hospitalization: acute anemia, monitor H&H, GI work up  Subjective:   I am ok     Medications:     Current Facility-Administered Medications   Medication Dose Route Frequency   . pantoprazole  40 mg Intravenous BID       Review of Systems:    General ROS: negative, no weight loss/gain, no fever, no chills, no rigor   ENT ROS: negative   Endocrine ROS: negative, no fatigue, no polydipsia   Respiratory ROS: no cough, shortness of breath, or wheezing   Cardiovascular ROS: no chest pain or dyspnea on exertion   Gastrointestinal ROS: no abdominal pain, change in bowel habits, +black  stools   Genito-Urinary ROS: no dysuria, trouble voiding, or hematuria   Musculoskeletal ROS: negative   Neurological ROS: no TIA or stroke symptoms, no encephalopathy   Dermatological ROS: negative, no rash, no ulcer    Physical Exam:     Filed Vitals:    08/04/15 1415   BP: 138/60   Pulse: 74   Temp:    Resp: 22   SpO2: 100%         Intake/Output Summary (Last 24 hours) at 08/04/15 1435  Last data filed at 08/04/15 1349   Gross per 24 hour   Intake 4392.5 ml   Output   2115 ml   Net 2277.5 ml       General appearance - alert and in no distress  Mental status - alert, oriented to person, place, and time  Eyes - pupils equal and reactive, extraocular eye movements intact  Nose - normal and patent, no erythema,  discharge or polyps  Mouth - mucous membranes moist, pharynx normal without lesions  Neck - Supple, no JVD, no thyromegaly  Lymphatics - no  lymphadenopathy  Chest - clear to auscultation, no wheezes, rales or rhonchi, symmetric air entry  Heart - normal rate, regular rhythm, normal S1, S2, no murmurs, rubs, clicks or gallops  Abdomen - soft, nontender, nondistended, no masses or organomegaly, bowel sounds normal  Neurological - alert, oriented, normal speech, no focal findings or movement disorder noted  Extremities - peripheral pulses normal, no pedal edema, no  clubbing or cyanosis  Skin - pale coloration and fair turgor, no rashes, no suspicious skin lesions noted    Labs:     CBC w/Diff CMP     Recent Labs  Lab 08/04/15  0408 08/03/15  0421 08/02/15  1505 08/01/15  1622   WBC 4.15 4.21 4.21 4.43   HGB 7.8* 7.5* 7.7*  7.7* 7.7*   HEMATOCRIT 24.8* 24.3* 24.8*  24.9* 24.7*   PLATELETS 214 220 214 218   MCV 89.2 90.3 90.8 91.1   NEUTROPHILS  --   --   --  65       PT/INR           Recent Labs  Lab 08/03/15  0421 08/01/15  1622   SODIUM 139 142   POTASSIUM 4.5 4.8   CHLORIDE 109 109   CO2 23 23   BUN 11.0 21.0   CREATININE 1.0 1.9*   GLUCOSE 95 94   CALCIUM 8.6 9.2   PROTEIN, TOTAL  --  6.4   ALBUMIN  --  3.5   AST (SGOT)  --  15   ALT  --  7   ALKALINE PHOSPHATASE  --  58   BILIRUBIN, TOTAL  --  0.3      Glucose POCT     Recent Labs  Lab 08/03/15  0421 08/01/15  1622   GLUCOSE 95 94          Recent Labs  Lab 08/02/15  1703 08/01/15  1622   TROPONIN I <0.01 <0.01           Urinalysis    Recent Labs  Lab 08/02/15  0121   URINE TYPE Clean Catch   COLOR, UA Straw   CLARITY, UA Clear   SPECIFIC GRAVITY UA 1.011   URINE PH 6.0   NITRITE, UA Negative   KETONES UA Negative   UROBILINOGEN, UA Negative   BILIRUBIN, UA Negative   BLOOD, UA Negative         Gean Quint, MD    08/04/2015  2:35 PM

## 2015-08-04 NOTE — Progress Notes (Signed)
The patient and care giver's learning abilities have been assessed. Today's individualized plan of care to continue IVF, advance diet after EGD and colonoscopy, fall prevention, wearing the SCDs, and continuing IV protonix was discussed with the patient and he agreed to it. Patient demonstrates understanding of disease process, treatment plan, medications and consequences of noncompliance. All questions and concerns were addressed. The EGD and colonoscopy was completed this afternoon. Refer to pt's chart at nursing station for results. The pt's diet was advanced to a consistent carbohydrate diet after his procedure today. The pt understands to call staff before getting up to use the restroom due to history of syncope and dizziness. The nurse practitioner, Zina Potorac was contacted regarding his IVF. She said that if the pt can adequately consume fluids PO then his maintenance IVF of 0.9% NaCl running at 75 mL/hour can be discontinued. Night nurse notified. The pt is not consuming adequate fluids by mouth during day shift and is still on maintenance IVF as of 1900.

## 2015-08-04 NOTE — Plan of Care (Addendum)
Problem: GI Bleed  Goal: Fluid and electrolyte balance are achieved/maintained  Outcome: Progressing  The patient's learning abilities have been assessed. Today's individualized plan of care to monitor VS, BS & H/H, continue IV fluid 0.9 Nacl @ 62ml/hr, IV protonix, fall safety precautions, golytely till 4:00 AM & NPO after midnight colonoscopy in AM was discussed with the patient and agree to it. Patient demonstrates understanding of disease process, treatment plan, medications and consequences of noncompliance. Pt is A&OX4, NSR on the tele. Denied of pain or discomfort. All questions and concerns were addressed. Will continue to monitor.

## 2015-08-04 NOTE — Progress Notes (Signed)
Pt from home alone.  He reports that he is independent and will return home at discharge. Pt has no advanced directives and didn't wish to execute any.  However, SW provided him with a copy for his review. No needs.      Lourena Simmonds, MSW  Case Management   Care Coordinator 2  x [phone]     08/04/15 1052   Patient Type   Within 30 Days of Previous Admission? No   Medicare focused diagnosis patient? Not a Medicare focused diagnosis patient   Bundle patient? Not a bundle patient   Healthcare Decisions   Interviewed: Patient   Orientation/Decision Making Abilities of Patient Alert and Oriented x3, able to make decisions   Advance Directive Patient does not have advance directive   Advance Directive not in Chart (Will leave with copy)   Healthcare Agent Appointed No   Prior to admission   Prior level of function Independent with ADLs;Ambulates independently   Type of Residence Private residence   Home Layout One level   Have running water, electricity, heat, etc? Yes   Living Arrangements Family members   Prior SNF admission? (Detail) No   Prior Rehab admission? (Detail) No   Adult Protective Services (APS) involved? No   Discharge Planning   Support Systems Family members   Patient expects to be discharged to: home   Anticipated [place] plan discussed with: Patient   Consults/Providers   Correct PCP listed in Epic? Yes   Important Message from [facility] Notice   Patient received 1st IMM Letter? No       Lourena Simmonds, MSW  Case Management   Care Coordinator 2  x [phone]     08/04/15 1052   Patient Type   Within 30 Days of Previous Admission? No   Medicare focused diagnosis patient? Not a Medicare focused diagnosis patient   Bundle patient? Not a bundle patient   Healthcare Decisions   Interviewed: Patient   Orientation/Decision Making Abilities of Patient Alert and Oriented x3, able to make decisions   Advance Directive Patient does not have advance directive   Advance Directive not in Chart (Will leave with copy)    Healthcare Agent Appointed No   Prior to admission   Prior level of function Independent with ADLs;Ambulates independently   Type of Residence Private residence   Home Layout One level   Have running water, electricity, heat, etc? Yes   Living Arrangements Family members   Prior SNF admission? (Detail) No   Prior Rehab admission? (Detail) No   Adult Protective Services (APS) involved? No   Discharge Planning   Support Systems Family members   Patient expects to be discharged to: home   Anticipated [place] plan discussed with: Patient   Consults/Providers   Correct PCP listed in Epic? Yes   Important Message from [facility] Notice   Patient received 1st IMM Letter? No

## 2015-08-04 NOTE — Anesthesia Postprocedure Evaluation (Signed)
Anesthesia Post Evaluation    Patient: [name]    Procedures performed: Procedure(s):  EGD, COLONOSCOPY    Anesthesia type: General TIVA    Patient location:PACU    Last vitals:   Filed Vitals:    08/04/15 1425   BP: 135/64   Pulse: 72   Temp: 36.8   Resp: 46   SpO2: 100%       Post pain: Patient not complaining of pain, continue current therapy      Mental Status:awake and alert     Respiratory Function: tolerating room air    Cardiovascular: stable    Nausea/Vomiting: patient not complaining of nausea or vomiting    Hydration Status: adequate    Post assessment: no apparent anesthetic complications

## 2015-08-04 NOTE — Transfer of Care (Signed)
Anesthesia Transfer of Care Note    Patient: [name]    Procedures performed: Procedure(s):  EGD, COLONOSCOPY    Anesthesia type: General TIVA    Patient location:[facility]    Last vitals:   Filed Vitals:    08/04/15 1353   BP: 102/50   Pulse: 71   Temp: 36.4 C (97.6 F)   Resp: 13   SpO2: 100%       Post pain: Patient not complaining of pain, continue current therapy      Mental Status:sedated    Respiratory Function: tolerating room air    Cardiovascular: stable    Nausea/Vomiting: patient not complaining of nausea or vomiting    Hydration Status: adequate    Post assessment: no apparent anesthetic complications and no reportable events

## 2015-08-05 ENCOUNTER — Encounter: Payer: Self-pay | Admitting: Specialist

## 2015-08-05 LAB — HEMOLYSIS INDEX: Hemolysis Index: 1 (ref 0–18)

## 2015-08-05 LAB — CBC
Hematocrit: 23 % — ABNORMAL LOW (ref 42.0–52.0)
Hgb: 7.4 g/dL — ABNORMAL LOW (ref 13.0–17.0)
MCH: 28.4 pg (ref 28.0–32.0)
MCHC: 32.2 g/dL (ref 32.0–36.0)
MCV: 88.1 fL (ref 80.0–100.0)
MPV: 10.6 fL (ref 9.4–12.3)
Nucleated RBC: 0 /100 WBC (ref 0–1)
Platelets: 213 10*3/uL (ref 140–400)
RBC: 2.61 10*6/uL — ABNORMAL LOW (ref 4.70–6.00)
RDW: 14 % (ref 12–15)
WBC: 5.09 10*3/uL (ref 3.50–10.80)

## 2015-08-05 LAB — GLUCOSE WHOLE BLOOD - POCT: Whole Blood Glucose POCT: 84 mg/dL (ref 70–100)

## 2015-08-05 LAB — IRON PROFILE
Iron Saturation: 6 % — ABNORMAL LOW (ref 15–50)
Iron: 21 ug/dL — ABNORMAL LOW (ref 40–160)
TIBC: 356 ug/dL (ref 261–462)
UIBC: 335 ug/dL (ref 126–382)

## 2015-08-05 LAB — LAB USE ONLY - HISTORICAL SURGICAL PATHOLOGY

## 2015-08-05 LAB — FERRITIN: Ferritin: 11.62 ng/mL — ABNORMAL LOW (ref 21.80–274.70)

## 2015-08-05 MED ORDER — METFORMIN HCL 1000 MG PO TABS
1000.0000 mg | ORAL_TABLET | Freq: Every morning | ORAL | Status: AC
Start: 2015-08-05 — End: ?

## 2015-08-05 MED ORDER — METFORMIN HCL 850 MG PO TABS
1000.0000 mg | ORAL_TABLET | Freq: Every morning | ORAL | Status: DC
Start: 2015-08-05 — End: 2015-08-05

## 2015-08-05 MED ORDER — IRON-VITAMIN C 65-125 MG PO TABS
1.0000 | ORAL_TABLET | Freq: Two times a day (BID) | ORAL | Status: DC
Start: 2015-08-05 — End: 2017-04-23

## 2015-08-05 MED ORDER — PANTOPRAZOLE SODIUM 40 MG PO TBEC
40.0000 mg | DELAYED_RELEASE_TABLET | Freq: Two times a day (BID) | ORAL | Status: DC
Start: 2015-08-05 — End: 2017-04-23

## 2015-08-05 NOTE — Discharge Summary (Signed)
DISCHARGE SUMMARY    Date Time: 08/05/2015 10:36 AM  Patient Name: [name]  Attending Physician: Gean Quint, MD    Date of Admission:   08/01/2015    Date of Discharge:   08/05/2015    Reason for Admission:   [place] [K92.1]  Acute blood loss anemia [D62]  Syncope, unspecified syncope type [R55]    Discharge Diagnosis   Syncope related to GIB  S/p EGD/colon 10/3: no acute bleeding  Constipation  Anemia, stable  Hx CVA in may 2016 on low dose asa  DM  HLP    Past Medical History:     Past Medical History   Diagnosis Date   . No diagnosis    . Diabetes mellitus    . Hyperlipidemia    . Cerebrovascular accident      03/2015       Past Surgical History:     Past Surgical History   Procedure Laterality Date   . Appendectomy     . Egd, biopsy N/A 06/30/2015     Procedure: EGD, BIOPSY;  Surgeon: [name], MD;  Location: [facility];  Service: Gastroenterology;  Laterality: N/A;       Family History:   History reviewed. No pertinent family history.    Social History:     Social History     Social History   . Marital Status: Single     Spouse Name: N/A   . Number of Children: N/A   . Years of Education: N/A     Social History Main Topics   . Smoking status: Current Some Day Smoker     Types: Cigarettes   . Smokeless tobacco: Not on file      Comment: smokes 2-3 cigaretes wekly   . Alcohol Use: Yes      Comment: occassionally   . Drug Use: No   . Sexual Activity: Not on file     Other Topics Concern   . Not on file     Social History Narrative       Allergies:     Allergies   Allergen Reactions   . Penicillins      Vaccination given :     There is no immunization history on file for this patient.      Discharge Medications:        Medication List      START taking these medications          iron-vitamin C 65-125 MG Tabs   Commonly known as:  VITRON C   Take 1 tablet by mouth 2 (two) times daily.         CHANGE how you take these medications          metFORMIN 1000 MG tablet   Commonly known as:  GLUCOPHAGE   Take  1 tablet (1,000 mg total) by mouth every morning with breakfast.   What changed:    - medication strength  - when to take this         CONTINUE taking these medications          aspirin 81 MG tablet       brimonidine 0.1 % Soln   Commonly known as:  ALPHAGAN P       glimepiride 2 MG tablet   Commonly known as:  AMARYL       pantoprazole 40 MG tablet   Commonly known as:  PROTONIX   Take 1 tablet (40 mg total) by mouth 2 (  two) times daily.       pioglitazone 45 MG tablet   Commonly known as:  ACTOS       simvastatin 40 MG tablet   Commonly known as:  ZOCOR   Take 1 tablet (40 mg total) by mouth nightly.            Where to Get Your Medications      You can get these medications from any pharmacy     Bring a paper prescription for each of these medications    - iron-vitamin C 65-125 MG Tabs  - pantoprazole 40 MG tablet      Information about where to get these medications is not yet available     ! Ask your nurse or doctor about these medications    - metFORMIN 1000 MG tablet          Consultations:   Treatment Team:   Attending Provider: Gean Quint, MD  Consulting Physician: Andreas Ohm, MD    Physical Exam:   BP 143/62 mmHg  Pulse 76  Temp(Src) 98.2 F (36.8 C) (Oral)  Resp 18  Ht 1.753 m (5\' 9" )  Wt 73.437 kg (161 lb 14.4 oz)  BMI 23.90 kg/m2  SpO2 98%      General: Not in acute distress  HEENT: NC/AT, pupil reacting to light  Neck: supple  Chest: Clear to auscultation  CV: normal S1 and S2  Abdomen: soft, BS+ve, non distended,nontender  Extremities: no edema  Neuro: AAOx3, moving all extremities  Skin: pale, good turgor  Labs:     Recent Labs  Lab 08/03/15  0421 08/01/15  1622   GLUCOSE 95 94   BUN 11.0 21.0   CREATININE 1.0 1.9*   CALCIUM 8.6 9.2   SODIUM 139 142   POTASSIUM 4.5 4.8   CHLORIDE 109 109   CO2 23 23   ALBUMIN  --  3.5   AST (SGOT)  --  15   ALT  --  7   BILIRUBIN, TOTAL  --  0.3   ALKALINE PHOSPHATASE  --  58       Recent Labs  Lab 08/05/15  0510 08/04/15  0408  08/03/15  0421   WBC 5.09 4.15 4.21   HGB 7.4* 7.8* 7.5*   HEMATOCRIT 23.0* 24.8* 24.3*   MCV 88.1 89.2 90.3   MCH 28.4 28.1 27.9*   MCHC 32.2 31.5* 30.9*   PLATELETS 213 214 220               Microbiology Results     None              ABGs:  No results found for: ABGCOLLECTIO, ALLENSTEST, PHART, PCO2ART, PO2ART, HCO3ART, BEART, O2SATART    Urinalysis    Recent Labs  Lab 08/02/15  0121   URINE TYPE Clean Catch   COLOR, UA Straw   CLARITY, UA Clear   SPECIFIC GRAVITY UA 1.011   URINE PH 6.0   NITRITE, UA Negative   KETONES UA Negative   UROBILINOGEN, UA Negative   BILIRUBIN, UA Negative   BLOOD, UA Negative       RADIOLOGY :  Ct Head Without Contrast    08/01/2015  CLINICAL INDICATION:  Headache, syncope TECHNIQUE:  Axial noncontrast CT images were obtained from the skull base to the vertex. A combination of a automatic exposure control, adjustment of the mA and/or kV  according to patient size and/or use of iterative reconstruction technique was  utilized. COMPARISON:  05/21/2015 INTERPRETATION: There is no acute intracranial hemorrhage or extra axial fluid collection. There is stable periventricular low-attenuation, consistent with chronic small vessel ischemic disease. The ventricles have a normal configuration. There is no midline shift or mass effect. The gray-white matter differentiation is normal. Visualized paranasal sinuses and mastoid air cells are clear.        08/01/2015    No acute intracranial abnormality. Wyatt Portela, MD 08/01/2015 5:17 PM           Procedures performed:   EGD 10/3: normal EGD. Had egd 8/28 with erosive esophagitis has been on PPI bid  Colonoscopy 10/3: pan-diverticulosis, 3mm polyp in ascending, ascending lipoma, 3mm sigmoid polyp, internal hemorrhoids. No obvious source identified. Path pending.    Hospital Course:   See daily progress notes 81 year old male with history of cva in may on asa, HLP, DM, recent admit with dizziness and dx anemia and had egd around 8/28 that showed erosive   Esophagitis admitted with dizziness/syncope related to anemia and passing hard stools.  hct has been stable at 23-24%.  Iron studies drawn prior to [place].  Seen by GI, had repeat EGD on 10/3: normal, colo same day with  Results as above, no acute source of bleeding,  Needs to follow up for path of polpys. Continue ppi bid and continue asa since stroke recent (d/w GI).  Stable for [place] home, will start vitron c on [place].  Follow up pcp and gi.           Discharge Instructions:   [place] home       Follow-up with       [name], MD  [address]  [place] [address]  [phone]    In 1 week      [name]  [address]  [place] [address]  [phone]    In 1 week  GI doctor          do not take any advil, ibuprofen, aleve, motrin, etc, you may take tylenol if needed for pain      Signed by:   Gean Quint, MD    Routed to dr Selena Batten

## 2015-08-05 NOTE — Progress Notes (Signed)
Progress Note  [facility] [id number]    08/05/2015      Assessment:  Syncope/GIB - c/o hard black stool. Recently had EGD in 06/2015 revealing erosive gastritis. EGD/colon yesterday unrevealing.   Constipation  Anemia H/H overall stable.   H/o CVA in 03/2015 - on ASA daily outpatient  HLD  DM  Colon polyps path pending.     [name] is a 81 y.o. male with multiple co-morbidities presented with c/o dizziness and passing hard black stool. Recently hospitalized in 8/28-8/30 with similar complaints. During that time he had an EGD done revealing erosive esophagitis.   EGD/colon done yesterday for evaluation: normal EGD. Colon with pan-diverticulosis, 3mm polyp in ascending, ascending lipoma, 3mm sigmoid polyp, internal hemorrhoids. No obvious source identified. Path pending.    Plan:  Diet as tolerated.  No obvious source of anemia identified on EGD/colon yesterday. Path pending. H/H overall stable.   If active bleeding, consider "second look" EGD/colon vs pillcam  Monitor H/H and for s/s GIB, transfuse prn.   Follow up colonoscopy path, pending.  Supportive care.     This case will be discussed with Dr. Cam Hai.  Ciella Obi Molinda Bailiff, PA   9:04 AM    Subjective:  Feeling fine. No complaints. Tolerating regular diet. No signs of bleeding. Has not moved his bowels since procedure. Eager to go home when able.     Objective:    Current Facility-Administered Medications   Medication Dose Route Frequency Last Rate Last Dose   . 0.9%  NaCl infusion   Intravenous Continuous 75 mL/hr at 08/04/15 1132     . acetaminophen (TYLENOL) tablet 650 mg  650 mg Oral Q4H PRN   650 mg at 08/03/15 0625   . dextrose 50 % bolus 25 mL  25 mL Intravenous PRN       . glucagon (rDNA) (GLUCAGEN) injection 1 mg  1 mg Intramuscular PRN       . insulin aspart (NovoLOG) injection 1-4 Units  1-4 Units Subcutaneous QHS PRN   1 Units at 08/04/15 2239   . insulin aspart (NovoLOG) injection 1-8 Units  1-8 Units Subcutaneous TID AC PRN   1 Units  at 08/03/15 1316   . lactated ringers infusion   Intravenous Continuous   Stopped at 08/04/15 1349   . ondansetron (ZOFRAN) injection 4 mg  4 mg Intravenous Q6H PRN       . pantoprazole (PROTONIX) injection 40 mg  40 mg Intravenous BID   40 mg at 08/04/15 1829       Physical Exam:  BP 143/62 mmHg  Pulse 76  Temp(Src) 98.2 F (36.8 C) (Oral)  Resp 18  Ht 1.753 m (5\' 9" )  Wt 73.437 kg (161 lb 14.4 oz)  BMI 23.90 kg/m2  SpO2 98%    General Appearance: NAD, comfortable, AAOx3  Abd: soft, NT, ND, BS+      Recent Labs  Lab 08/05/15  0510 08/04/15  0408 08/03/15  0421  08/01/15  1622   WBC 5.09 4.15 4.21 More results in Results Review 4.43   HGB 7.4* 7.8* 7.5* More results in Results Review 7.7*   HEMATOCRIT 23.0* 24.8* 24.3* More results in Results Review 24.7*   PLATELETS 213 214 220 More results in Results Review 218   MCV 88.1 89.2 90.3 More results in Results Review 91.1   NEUTROPHILS  --   --   --   --  65   More results in Results Review = values in this interval  not displayed.    Recent Labs  Lab 08/03/15  0421 08/01/15  1622   SODIUM 139 142   POTASSIUM 4.5 4.8   CHLORIDE 109 109   CO2 23 23   BUN 11.0 21.0   CREATININE 1.0 1.9*   GLUCOSE 95 94   CALCIUM 8.6 9.2   PROTEIN, TOTAL  --  6.4   ALBUMIN  --  3.5   AST (SGOT)  --  15   ALT  --  7   ALKALINE PHOSPHATASE  --  58   BILIRUBIN, TOTAL  --  0.3     Glucose:    Recent Labs  Lab 08/03/15  0421 08/01/15  1622   GLUCOSE 95 94

## 2015-08-05 NOTE — Discharge Instructions (Signed)
Date Time: 08/05/2015 11:28 AM  Attending Physician: Gean Quint, MD    Date of Admission:   08/01/2015    Reason for Admission:   Melena [K92.1]  Acute blood loss anemia [D62]  Syncope, unspecified syncope type [R55]    Follow up:        Neurology (If Stroke): ______________________  Phone:______________________    Other:___________________________________   Phone: ______________________       Medications:  . Your medications have been listed for you on the Medication Reconciliation Discharge Home List. Please bring a copy of all discharge instructions, including your Medication Reconciliation Discharge Home List when you visit your physician.    . Continue taking all medications even if you feel well, unless otherwise instructed by physician.  . Do not take any over-the-counter medications or herbal supplements without checking with your pharmacist or doctor.       If you are taking Warfarin, please follow up with (health professional/clinic) ____________ on _____________to have your PT/INR blood test checked. ****    Activity:  . Rise slowly from a sitting or lying position. Increase activity slowly, unless otherwise instructed by physician.  Marland Kitchen Perform exercises as desginated by Therapist and Physician.  . In the event of severe shortness of breath or chest discomfort, call 911. Do NOT drive to the hospital.  . Speak with your physician regarding specific driving and/or work restrictions.    Diet:  . If you have special diet orders, you have been given printed diet instructions.   ________________________________________________________________________    Tobacco Cessation Counseling:  If you are currently a tobacco user or have used tobacco within the last 12 months, we have provided you with written Tobacco Cessation Counseling.  ________________________________________________________________________    Heart Failure Education:  If you have a diagnosis of a Heart Failure, we have provided you with written  Heart Failure Education.   . Weigh yourself once a day at the same time. Record and bring the weight record to your next physician appointment.   . Call your doctor if you gain more than 3 pounds in one day or 5 pounds in one week, or if you experience shortness of breath, leg swelling and/or chest discomfort.   . Enroll in the [facility] Tel-Assurance Program, a heart failure patient support program. Call [phone] for additional details.   ________________________________________________________________________    Diamond Nickel Education:  . Call 911 for:  Marland Kitchen Sudden numbness or weakness of the face  . Sudden numbness of the arm or leg especially one side of the body  . Sudden confusion, trouble speaking or understanding  . Sudden trouble seeing in one or both eyes  . Sudden trouble walking or dizziness, loss of balance or coordination      . For promotion of your health, we have provided you with personalized written education on risk factors specific to your diagnosis, including but not limited to:  Marland Kitchen High Blood Pressure  . High Cholesterol  . Atrial Fibrillation  . Overweight  . Diabetes  . Smoking         Vaccinations  Pneumonia Vaccine Received on:  Flu Vaccine Received on:             Treatments/Special Instructions:     Anemia  Anemia is a condition that occurs when your body does not have enough healthy red blood cells (RBCs). Your RBCs are the parts of your blood that carry oxygen throughout your body. A protein called hemoglobin allows your RBCs to absorb and release  oxygen. Without enough RBCs or hemoglobin, your body doesn't get enough oxygen. Symptoms of anemia may then occur.    Symptoms of anemia  Some people with anemia have no symptoms. But most people have symptoms that range from mild to severe. These can include:   Tiredness (fatigue)   Weakness   Pale skin   Shortness of breath   Dizziness or fainting   Rapid heartbeat   Trouble doing normal amounts of activity   Jaundice (yellowing of  your eyes, skin, or mouth; dark urine)  Causes of anemia  Anemia can occur when your body:   Loses too much blood   Does not make enough RBCs   Destroys your RBCs at a faster rate than it can replace them   Does not make a normal amount of hemoglobin in your RBCs  These problems can occur for many reasons, including:   A condition that you are born with (congenital or inherited). This includes sickle cell disease or thalassemia.   Heavy bleeding for any reason, including injury, surgery, childbirth, or even heavy menstrual periods.   Being low in certain nutrients, such as iron, folate, or vitamin B12. This may be due to poor diet. Also, a condition like celiac disease or Crohn's disease can cause poor absorption of these nutrients   Certain chronic conditions like diabetes, arthritis, or kidney disease.   Certain chronic infections like tuberculosis or HIV.   Exposure to certain medications, such as those used for chemotherapy.  There are different types of anemia. Your doctor can tell you more about the type of anemia you have and what may have caused it.  Diagnosing anemia  To diagnose anemia, your doctor gives you blood tests. These can include:   Complete blood cell count (CBC). This test measures the amounts of the different types of blood cells.   Blood smear. This test checks the size and shape of your blood cells. To perform the test, your doctor views a drop of your blood under a microscope. Your doctor uses a stain to make the blood cells easier to see.   Iron studies. These tests measure the amount of iron in your blood. Your body needs iron to make hemoglobin in your RBCs.   Vitamin B12 and folate studies. These tests check for some of the components that help give RBCs a normal size and shape.   Reticulocyte count. This test measures the amount of new RBCs that your bone marrow makes.   Hemoglobin electrophoresis. This test checks for problems with your hemoglobin in RBCs.  Treating  anemia  Treatment for anemia is based on the type of anemia, its cause, and the severity of your symptoms. Treatments may include:   Diet changes. This involves increasing the amount of certain nutrients in your diet, such as iron, vitamin B12, or folate. Your doctor may also prescribe nutrient supplements.   Medications. Certain medications treat the cause of your anemia. Others help build new RBCs or relieve symptoms. If a medication is the cause of your anemia, you may need to stop or change it.   Blood transfusions. Replacing some of your blood can increase the number of healthy RBCs in your body.   Surgery. In some cases, your doctor can do surgery to treat the underlying cause of anemia. If you need surgery, your doctor will explain the procedure and outline the risks and benefits for you.  Long-term concerns  If you have a certain type of anemia, you can expect  a full recovery after treatment. If you have other types of anemia (especially a type you're born with), you will need to manage it for life. Your doctor can tell you more.   [address] [employer] [address] [place], [place] [address]. All rights reserved. This information is not intended as a substitute for professional medical care. Always follow your healthcare professional's instructions    Signed by: Zoe Lan, RN    I HAVE RECEIVED AND UNDERSTAND THESE DISCHARGE INSTRUCTIONS.

## 2015-08-05 NOTE — Plan of Care (Signed)
Date Time: 08/05/2015 2:18 PM  Attending Physician: Gean Quint, MD    Date of Admission:   08/01/2015    Reason for Admission:   Melena [K92.1]  Acute blood loss anemia [D62]  Syncope, unspecified syncope type [R55]    Follow up:        Neurology (If Stroke): ______________________  Phone:______________________    Other:___________________________________   Phone: ______________________       Medications:  . Your medications have been listed for you on the Medication Reconciliation Discharge Home List. Please bring a copy of all discharge instructions, including your Medication Reconciliation Discharge Home List when you visit your physician.    . Continue taking all medications even if you feel well, unless otherwise instructed by physician.  . Do not take any over-the-counter medications or herbal supplements without checking with your pharmacist or doctor.       If you are taking Warfarin, please follow up with (health professional/clinic) ____________ on _____________to have your PT/INR blood test checked.   Activity:  . Rise slowly from a sitting or lying position. Increase activity slowly, unless otherwise instructed by physician.  Marland Kitchen Perform exercises as desginated by Therapist and Physician.  . In the event of severe shortness of breath or chest discomfort, call 911. Do NOT drive to the hospital.  . Speak with your physician regarding specific driving and/or work restrictions.    Diet:  . If you have special diet orders, you have been given printed diet instructions.   ________________________________________________________________________    Tobacco Cessation Counseling:  If you are currently a tobacco user or have used tobacco within the last 12 months, we have provided you with written Tobacco Cessation Counseling.  ________________________________________________________________________    Heart Failure Education:  If you have a diagnosis of a Heart Failure, we have provided you with written Heart  Failure Education.   . Weigh yourself once a day at the same time. Record and bring the weight record to your next physician appointment.   . Call your doctor if you gain more than 3 pounds in one day or 5 pounds in one week, or if you experience shortness of breath, leg swelling and/or chest discomfort.   . Enroll in the [facility] Tel-Assurance Program, a heart failure patient support program. Call [phone] for additional details.   ________________________________________________________________________    Diamond Nickel Education:  . Call 911 for:  Marland Kitchen Sudden numbness or weakness of the face  . Sudden numbness of the arm or leg especially one side of the body  . Sudden confusion, trouble speaking or understanding  . Sudden trouble seeing in one or both eyes  . Sudden trouble walking or dizziness, loss of balance or coordination      . For promotion of your health, we have provided you with personalized written education on risk factors specific to your diagnosis, including but not limited to:  Marland Kitchen High Blood Pressure  . High Cholesterol  . Atrial Fibrillation  . Overweight  . Diabetes  . Smoking         Vaccinations  Pneumonia Vaccine Received on:  Flu Vaccine Received on:             Treatments/Special Instructions:                  Signed by: Duwaine Maxin, RN    I HAVE RECEIVED AND UNDERSTAND THESE DISCHARGE INSTRUCTIONS.

## 2015-08-05 NOTE — Plan of Care (Signed)
Problem: Safety  Goal: Patient will be free from injury during hospitalization  The patient  learning abilities have been assessed. Today's individualized plan of care to continue with iv protonix, no fall .was discussed with the patient  and agree to it. Patient  demonstrates understanding of disease process, treatment plan, medications and consequences of noncompliance. All questions and concerns were addressed.

## 2015-08-05 NOTE — Plan of Care (Signed)
Problem: GI Bleed  Goal: Fluid and electrolyte balance are achieved/maintained  Outcome: Progressing  The patient's learning abilities have been assessed. Today's individualized plan of care to monitor VS, BS & H/H, continue IV fluid 0.9 Nacl @ 51ml/hr, IV protonix, fall safety precautions, was discussed with the patient and agree to it. Patient demonstrates understanding of disease process, treatment plan, medications and consequences of noncompliance. Pt is A&OX4, NSR on the tele. Denied of pain or discomfort. EGD & colonoscopy completed. All questions and concerns were addressed. Will continue to monitor.

## 2015-08-05 NOTE — Discharge Summary -  Nursing (Signed)
Discharge teaching and prescription given to pt, pt verbalized discharge teaching and the important to follow up with GI. IV and tele removed, pt discharge with all his belongings, ambulates out of the unit in stable condition accompanied by staff.

## 2015-08-05 NOTE — UM Notes (Addendum)
Primary Payor: MEDICARE HMO/ANTHEM COORDINATED CARE CARECAID DUAL     Admit to Inpatient (Order [id number] written 08/01/15 at 1926)    [name] is a 81 y.o. male with multiple co-morbidities presented with c/o dizziness and passing hard black stool. Recently hospitalized in 8/28-8/30 with similar complaints. During that time he had an EGD done revealing erosive esophagitis.   EGD/colon done today for evaluation: normal EGD. Colon with pan-diverticulosis, 3mm polyp in ascending, ascending lipoma, 3mm sigmoid polyp, internal hemorrhoids. No obvious source identified.      08/04/15 1415   BP: 138/60   Pulse: 74   Temp:    Resp: 22   SpO2: 100%                  Recent Labs  Lab 08/04/15  0408 08/03/15  0421 08/02/15  1505 08/01/15  1622   WBC 4.15 4.21 4.21 4.43   HGB 7.8* 7.5* 7.7*  7.7* 7.7*   HEMATOCRIT 24.8* 24.3* 24.8*  24.9* 24.7*   PLATELETS 214 220 214 218   MCV 89.2 90.3 90.8 91.1   NEUTROPHILS --  --  --  65       Recent Labs  Lab 08/03/15  0421 08/01/15  1622   SODIUM 139 142   POTASSIUM 4.5 4.8   CHLORIDE 109 109   CO2 23 23   BUN 11.0 21.0   CREATININE 1.0 1.9*   GLUCOSE 95 94   CALCIUM 8.6 9.2   PROTEIN, TOTAL --  6.4   ALBUMIN --  3.5   AST (SGOT) --  15   ALT --  7   ALKALINE PHOSPHATASE --  58   BILIRUBIN, TOTAL --  0.3     Glucose:   Recent Labs  Lab 08/03/15  0421 08/01/15  1622   GLUCOSE 95 94                  GI Med note 08/04/15  Assessment:  Syncope/GIB - c/o hard black stool. Recently had EGD in 06/2015 revealing erosive gastritis. EGD/colon today unrevealing.   Constipation  Anemia H/H overall stable.   H/o CVA in 03/2015 - on ASA daily outpatient  HLD  DM  Plan:  Diet as tolerated for now.   No obvious source of anemia identified on EGD/colon.   If active bleeding, consider repeat "second look" EGD/colon vs pillcam.   Monitor H/H and for s/s GIB, transfuse prn.   Supportive  care.     Pulmonology note 08/04/15  Assessment:  Syncope  Black stools for one week per pt  Acute on chronica anemia  Erosive esophagitis-EGD on 07/01/15  DM  HLD  Hx cva on ASA daily in outpatient   Plan:  GI following, colonoscopy neg for ulcers  Cont with protonix BID  Am labs  Monitor H&H  DVT prophylaxis, no meds secondary to acute anemia and poss GI bleed  GI Prophylaxis, protonix   D/W RN, pt   Reason for contd hospitalization: acute anemia, monitor H&H, GI work up      Meds  .0.9% NaCl infusion Intravenous Continuous 75 mL/hr at 08/04/15 1132   .acetaminophen (TYLENOL) tablet 650 mg 650 mgOral Q4H PRN650 mg at 08/03/15 0625  .dextrose 50 % bolus 25 mL 25 mLIntravenous PRN    .glucagon (rDNA) (GLUCAGEN) injection 1 mg 1 mg Intramuscular PRN    .insulin aspart (NovoLOG) injection 1-4 Units 1-4 Units SubcutaneousQHS PRN1 Units at 08/02/15 2202  .insulin aspart (NovoLOG) injection 1-8 Units  1-8 Units SubcutaneousTID AC PRN 1 Units at 08/03/15 1316  .lactated ringers infusion  Intravenous Continuous Stopped at 08/04/15 1349  .ondansetron (ZOFRAN) injection 4 mg 4 mg Intravenous Q6H PRN    .pantoprazole (PROTONIX) injection 40 mg40 mg Intravenous BID  40 mg at 08/04/15 1021      Eddie North RN,BSN  Utilization Review Case Manager  Case Management Department  [facility]  T [phone] Judie Petit [phone]  Carmon Ginsberg [phone]     '

## 2015-08-05 NOTE — Progress Notes (Signed)
SW scheduled follow up apt for pt with PCP and notified him.    Lourena Simmonds, MSW  Case Management   Care Coordinator 2  x [phone]     08/05/15 1043   Discharge Disposition   Patient preference/choice provided? N/A   Physical Discharge Disposition Home, No Needs   CM Interventions   Follow up appointment scheduled? Yes   Follow up appointment scheduled with: PCP   Multidisciplinary rounds/family meeting before d/c? Yes   Medicare Checklist   Is this a Medicare patient? Yes   Patient received 1st IMM Letter? No   If LOS 3 days or greater, did patient received 2nd IMM Letter? Yes

## 2015-08-08 LAB — FOLATE RBC
Hematocrit: 25.2 %
RBC Folate: >1000 ng/mL (ref >280)

## 2015-09-02 ENCOUNTER — Ambulatory Visit
Admission: RE | Admit: 2015-09-02 | Discharge: 2015-09-02 | Disposition: A | Discharge status: Home / Self Care | Payer: Medicare (Managed Care) | Source: Ambulatory Visit | Attending: Internal Medicine | Admitting: Internal Medicine

## 2015-09-02 DIAGNOSIS — R7301 Impaired fasting glucose: Secondary | ICD-10-CM | POA: Insufficient documentation

## 2015-09-02 DIAGNOSIS — R109 Unspecified abdominal pain: Secondary | ICD-10-CM | POA: Insufficient documentation

## 2015-09-02 DIAGNOSIS — M810 Age-related osteoporosis without current pathological fracture: Secondary | ICD-10-CM | POA: Insufficient documentation

## 2015-09-02 DIAGNOSIS — R530 Neoplastic (malignant) related fatigue: Secondary | ICD-10-CM | POA: Insufficient documentation

## 2015-09-02 DIAGNOSIS — R350 Frequency of micturition: Secondary | ICD-10-CM | POA: Insufficient documentation

## 2015-09-02 DIAGNOSIS — E119 Type 2 diabetes mellitus without complications: Secondary | ICD-10-CM | POA: Insufficient documentation

## 2015-09-02 DIAGNOSIS — M2550 Pain in unspecified joint: Secondary | ICD-10-CM | POA: Insufficient documentation

## 2015-09-02 DIAGNOSIS — E785 Hyperlipidemia, unspecified: Secondary | ICD-10-CM | POA: Insufficient documentation

## 2015-09-02 DIAGNOSIS — E039 Hypothyroidism, unspecified: Secondary | ICD-10-CM | POA: Insufficient documentation

## 2015-09-02 DIAGNOSIS — D519 Vitamin B12 deficiency anemia, unspecified: Secondary | ICD-10-CM | POA: Insufficient documentation

## 2015-09-02 DIAGNOSIS — I10 Essential (primary) hypertension: Secondary | ICD-10-CM | POA: Insufficient documentation

## 2015-09-02 DIAGNOSIS — E559 Vitamin D deficiency, unspecified: Secondary | ICD-10-CM | POA: Insufficient documentation

## 2015-09-02 DIAGNOSIS — G609 Hereditary and idiopathic neuropathy, unspecified: Secondary | ICD-10-CM | POA: Insufficient documentation

## 2015-09-02 DIAGNOSIS — M129 Arthropathy, unspecified: Secondary | ICD-10-CM | POA: Insufficient documentation

## 2015-09-02 LAB — URINALYSIS, REFLEX TO MICROSCOPIC EXAM IF INDICATED
Bilirubin, UA: NEGATIVE
Blood, UA: NEGATIVE
Glucose, UA: NEGATIVE
Ketones UA: NEGATIVE
Leukocyte Esterase, UA: NEGATIVE
Nitrite, UA: NEGATIVE
Protein, UR: NEGATIVE
Specific Gravity UA: 1.017 (ref 1.001–1.035)
Urine pH: 6.5 (ref 5.0–8.0)
Urobilinogen, UA: 0.2 (ref 0.2–2.0)

## 2015-09-02 LAB — COMPREHENSIVE METABOLIC PANEL
ALT: 8 U/L (ref 0–55)
AST (SGOT): 15 U/L (ref 5–34)
Albumin/Globulin Ratio: 1.3 (ref 0.9–2.2)
Albumin: 3.5 g/dL (ref 3.5–5.0)
Alkaline Phosphatase: 59 U/L (ref 38–106)
BUN: 20 mg/dL (ref 9.0–28.0)
Bilirubin, Total: 0.3 mg/dL (ref 0.1–1.2)
CO2: 24 mEq/L (ref 21–30)
Calcium: 8.9 mg/dL (ref 7.9–10.2)
Chloride: 110 mEq/L (ref 100–111)
Creatinine: 1.1 mg/dL (ref 0.5–1.5)
Globulin: 2.8 g/dL (ref 2.0–3.7)
Glucose: 116 mg/dL — ABNORMAL HIGH (ref 70–100)
Potassium: 4.7 mEq/L (ref 3.5–5.3)
Protein, Total: 6.3 g/dL (ref 6.0–8.3)
Sodium: 141 mEq/L (ref 135–146)

## 2015-09-02 LAB — LIPID PANEL
Cholesterol / HDL Ratio: 2.7
Cholesterol: 129 mg/dL (ref 0–199)
HDL: 47 mg/dL (ref 40–9999)
LDL Calculated: 72 mg/dL (ref 0–99)
Triglycerides: 50 mg/dL (ref 34–149)
VLDL Calculated: 10 mg/dL (ref 10–40)

## 2015-09-02 LAB — GFR: EGFR: >60

## 2015-09-02 LAB — CBC AND DIFFERENTIAL
Basophils Absolute Automated: 0.01 10*3/uL (ref 0.00–0.20)
Basophils Automated: 0 %
Eosinophils Absolute Automated: 0.1 10*3/uL (ref 0.00–0.70)
Eosinophils Automated: 2 %
Hematocrit: 27.3 % — ABNORMAL LOW (ref 42.0–52.0)
Hgb: 8.2 g/dL — ABNORMAL LOW (ref 13.0–17.0)
Immature Granulocytes Absolute: 0 10*3/uL
Immature Granulocytes: 0 %
Lymphocytes Absolute Automated: 1.04 10*3/uL (ref 0.50–4.40)
Lymphocytes Automated: 26 %
MCH: 27.8 pg — ABNORMAL LOW (ref 28.0–32.0)
MCHC: 30 g/dL — ABNORMAL LOW (ref 32.0–36.0)
MCV: 92.5 fL (ref 80.0–100.0)
MPV: 10.6 fL (ref 9.4–12.3)
Monocytes Absolute Automated: 0.31 10*3/uL (ref 0.00–1.20)
Monocytes: 8 %
Neutrophils Absolute: 2.54 10*3/uL (ref 1.80–8.10)
Neutrophils: 63 %
Nucleated RBC: 0 /100 WBC (ref 0–1)
Platelets: 294 10*3/uL (ref 140–400)
RBC: 2.95 10*6/uL — ABNORMAL LOW (ref 4.70–6.00)
RDW: 17 % — ABNORMAL HIGH (ref 12–15)
WBC: 4 10*3/uL (ref 3.50–10.80)

## 2015-09-02 LAB — URIC ACID: Uric acid: 5.4 mg/dL (ref 3.6–9.7)

## 2015-09-02 LAB — HEMOGLOBIN A1C
Average Estimated Glucose: 96.8 mg/dL
Hemoglobin A1C: 5 % (ref 4.6–5.9)

## 2015-09-02 LAB — HEMOLYSIS INDEX: Hemolysis Index: 6 (ref 0–18)

## 2015-09-02 LAB — FERRITIN: Ferritin: 12.53 ng/mL — ABNORMAL LOW (ref 21.80–274.70)

## 2015-09-02 LAB — VITAMIN B12: Vitamin B-12: 193 pg/mL — ABNORMAL LOW (ref 211–911)

## 2015-09-02 LAB — TSH: TSH: 2.43 u[IU]/mL (ref 0.35–4.94)

## 2016-04-29 ENCOUNTER — Emergency Department: Payer: Medicare (Managed Care)

## 2016-04-29 ENCOUNTER — Emergency Department
Admission: EM | Admit: 2016-04-29 | Discharge: 2016-04-29 | Disposition: A | Discharge status: Home / Self Care | Payer: Medicare (Managed Care) | Attending: Emergency Medicine | Admitting: Emergency Medicine

## 2016-04-29 DIAGNOSIS — Z79899 Other long term (current) drug therapy: Secondary | ICD-10-CM | POA: Insufficient documentation

## 2016-04-29 DIAGNOSIS — E860 Dehydration: Secondary | ICD-10-CM

## 2016-04-29 DIAGNOSIS — R195 Other fecal abnormalities: Secondary | ICD-10-CM | POA: Insufficient documentation

## 2016-04-29 DIAGNOSIS — E785 Hyperlipidemia, unspecified: Secondary | ICD-10-CM | POA: Insufficient documentation

## 2016-04-29 DIAGNOSIS — R55 Syncope and collapse: Secondary | ICD-10-CM | POA: Insufficient documentation

## 2016-04-29 DIAGNOSIS — Z7982 Long term (current) use of aspirin: Secondary | ICD-10-CM | POA: Insufficient documentation

## 2016-04-29 DIAGNOSIS — E119 Type 2 diabetes mellitus without complications: Secondary | ICD-10-CM | POA: Insufficient documentation

## 2016-04-29 DIAGNOSIS — Z7984 Long term (current) use of oral hypoglycemic drugs: Secondary | ICD-10-CM | POA: Insufficient documentation

## 2016-04-29 DIAGNOSIS — F17210 Nicotine dependence, cigarettes, uncomplicated: Secondary | ICD-10-CM | POA: Insufficient documentation

## 2016-04-29 DIAGNOSIS — D649 Anemia, unspecified: Secondary | ICD-10-CM | POA: Insufficient documentation

## 2016-04-29 DIAGNOSIS — N289 Disorder of kidney and ureter, unspecified: Secondary | ICD-10-CM | POA: Insufficient documentation

## 2016-04-29 DIAGNOSIS — Z8673 Personal history of transient ischemic attack (TIA), and cerebral infarction without residual deficits: Secondary | ICD-10-CM | POA: Insufficient documentation

## 2016-04-29 DIAGNOSIS — E11649 Type 2 diabetes mellitus with hypoglycemia without coma: Secondary | ICD-10-CM

## 2016-04-29 LAB — GFR: EGFR: 50.3

## 2016-04-29 LAB — COMPREHENSIVE METABOLIC PANEL
ALT: 11 U/L (ref 0–55)
AST (SGOT): 18 U/L (ref 5–34)
Albumin/Globulin Ratio: 0.9 (ref 0.9–2.2)
Albumin: 3.2 g/dL — ABNORMAL LOW (ref 3.5–5.0)
Alkaline Phosphatase: 65 U/L (ref 38–106)
Anion Gap: 10 (ref 5.0–15.0)
BUN: 25 mg/dL (ref 9.0–28.0)
Bilirubin, Total: 0.4 mg/dL (ref 0.2–1.2)
CO2: 23 mEq/L (ref 22–29)
Calcium: 9 mg/dL (ref 7.9–10.2)
Chloride: 106 mEq/L (ref 100–111)
Creatinine: 1.6 mg/dL — ABNORMAL HIGH (ref 0.7–1.3)
Globulin: 3.6 g/dL (ref 2.0–3.6)
Glucose: 107 mg/dL — ABNORMAL HIGH (ref 70–100)
Potassium: 4.9 mEq/L (ref 3.5–5.1)
Protein, Total: 6.8 g/dL (ref 6.0–8.3)
Sodium: 139 mEq/L (ref 136–145)

## 2016-04-29 LAB — CBC AND DIFFERENTIAL
Absolute NRBC: 0 10*3/uL
Basophils Absolute Automated: 0.02 10*3/uL (ref 0.00–0.20)
Basophils Automated: 0.3 %
Eosinophils Absolute Automated: 0.08 10*3/uL (ref 0.00–0.70)
Eosinophils Automated: 1.4 %
Hematocrit: 32.1 % — ABNORMAL LOW (ref 42.0–52.0)
Hgb: 10.2 g/dL — ABNORMAL LOW (ref 13.0–17.0)
Immature Granulocytes Absolute: 0.02 10*3/uL
Immature Granulocytes: 0.3 %
Lymphocytes Absolute Automated: 1.01 10*3/uL (ref 0.50–4.40)
Lymphocytes Automated: 17.3 %
MCH: 29.6 pg (ref 28.0–32.0)
MCHC: 31.8 g/dL — ABNORMAL LOW (ref 32.0–36.0)
MCV: 93 fL (ref 80.0–100.0)
MPV: 10.1 fL (ref 9.4–12.3)
Monocytes Absolute Automated: 0.38 10*3/uL (ref 0.00–1.20)
Monocytes: 6.5 %
Neutrophils Absolute: 4.34 10*3/uL (ref 1.80–8.10)
Neutrophils: 74.2 %
Nucleated RBC: 0 /100 WBC (ref 0.0–1.0)
Platelets: 237 10*3/uL (ref 140–400)
RBC: 3.45 10*6/uL — ABNORMAL LOW (ref 4.70–6.00)
RDW: 14 % (ref 12–15)
WBC: 5.85 10*3/uL (ref 3.50–10.80)

## 2016-04-29 LAB — TYPE AND SCREEN
AB Screen Gel: NEGATIVE
ABO Rh: A POS

## 2016-04-29 LAB — URINALYSIS, REFLEX TO MICROSCOPIC EXAM IF INDICATED
Bilirubin, UA: NEGATIVE
Blood, UA: NEGATIVE
Glucose, UA: NEGATIVE
Ketones UA: NEGATIVE
Leukocyte Esterase, UA: NEGATIVE
Nitrite, UA: NEGATIVE
Protein, UR: NEGATIVE
Specific Gravity UA: 1.018 (ref 1.001–1.035)
Urine pH: 5 (ref 5.0–8.0)
Urobilinogen, UA: NEGATIVE mg/dL

## 2016-04-29 LAB — ECG 12-LEAD
Atrial Rate: 76 {beats}/min
P Axis: 56 degrees
P-R Interval: 174 ms
Q-T Interval: 354 ms
QRS Duration: 80 ms
QTC Calculation (Bezet): 398 ms
R Axis: 70 degrees
T Axis: 67 degrees
Ventricular Rate: 76 {beats}/min

## 2016-04-29 LAB — TROPONIN I: Troponin I: <0.01 ng/mL (ref 0.00–0.09)

## 2016-04-29 LAB — LIPASE: Lipase: 40 U/L (ref 8–78)

## 2016-04-29 LAB — GLUCOSE WHOLE BLOOD - POCT
Whole Blood Glucose POCT: 108 mg/dL — ABNORMAL HIGH (ref 70–100)
Whole Blood Glucose POCT: 34 mg/dL — CL (ref 70–100)
Whole Blood Glucose POCT: 113 mg/dL — ABNORMAL HIGH (ref 70–100)

## 2016-04-29 LAB — HEMOLYSIS INDEX: Hemolysis Index: 3 (ref 0–18)

## 2016-04-29 MED ORDER — PANTOPRAZOLE SODIUM 40 MG IV SOLR
40.0000 mg | Freq: Once | INTRAVENOUS | Status: AC
Start: 2016-04-29 — End: 2016-04-29
  Administered 2016-04-29: 40 mg via INTRAVENOUS
  Filled 2016-04-29: qty 40

## 2016-04-29 MED ORDER — ONDANSETRON HCL 4 MG/2ML IJ SOLN
4.0000 mg | Freq: Once | INTRAMUSCULAR | Status: AC
Start: 2016-04-29 — End: 2016-04-29
  Administered 2016-04-29: 4 mg via INTRAVENOUS
  Filled 2016-04-29: qty 2

## 2016-04-29 MED ORDER — SODIUM CHLORIDE 0.9 % IV BOLUS
1000.0000 mL | Freq: Once | INTRAVENOUS | Status: AC
Start: 2016-04-29 — End: 2016-04-29
  Administered 2016-04-29: 1000 mL via INTRAVENOUS

## 2016-04-29 MED ORDER — ONDANSETRON 4 MG PO TBDP
4.0000 mg | ORAL_TABLET | Freq: Four times a day (QID) | ORAL | Status: DC | PRN
Start: 2016-04-29 — End: 2016-06-21

## 2016-04-29 NOTE — Discharge Instructions (Signed)
Drink plenty of fluids.  Do not take your diabetes medications today. Check your blood sugar tonight before bed.    See Dr. Selena Batten for follow up tomorrow.    Lie down if you feel lightheaded.    Return to the ED for any chest pain, trouble breathing, persistent lightheadedness, or if you change your mind about hospital admission.

## 2016-04-29 NOTE — ED Provider Notes (Signed)
EMERGENCY DEPARTMENT HISTORY AND PHYSICAL EXAM     Physician/Midlevel provider first contact with patient: 04/29/16 1427         Date: 04/29/2016  Patient Name: [name]    History of Presenting Illness     Chief Complaint   Patient presents with   . Dizziness       History Provided By: Patient    Chief Complaint: Dizziness  Onset: yesterday  Timing: Intermittent  Location: neuro  Quality: room spinning  Severity:   Exacerbating factors: worsened while at work  Alleviating factors: none  Associated Symptoms: CP (today), nausea, vomiting, abdominal pain, lightheadedness, near sycnope  Pertinent Negatives: diarrhea, black or blood in stool, appetite change, SOB, or other complaints    Additional History: Adhvik [name] is a 82 y.o. male with Hx of DM, HLD, and CVA presenting to the ED with dizziness and lightheadedness since yesterday. Today he felt it worsened while at work. Notes associated CP today. Also notes abdominal pain and N/V since 3 days ago. He last vomited yesterday once emesis. Denies being on blood thinners. No diarrhea, black or blood in stool, appetite change, or SOB. No other complaints at this time.    PCP: [name], MD  SPECIALISTS:    No current facility-administered medications for this encounter.     Current Outpatient Prescriptions   Medication Sig Dispense Refill   . aspirin 81 MG tablet Take 81 mg by mouth daily.     . brimonidine (ALPHAGAN P) 0.1 % Solution Place 1 drop into both eyes 2 (two) times daily.        Marland Kitchen glimepiride (AMARYL) 2 MG tablet Take 2 mg by mouth every morning before breakfast.     . iron-vitamin C (VITRON C) 65-125 MG Tab Take 1 tablet by mouth 2 (two) times daily. 60 tablet 0   . metFORMIN (GLUCOPHAGE) 1000 MG tablet Take 1 tablet (1,000 mg total) by mouth every morning with breakfast.     . ondansetron (ZOFRAN-ODT) 4 MG disintegrating tablet Take 1 tablet (4 mg total) by mouth every 6 (six) hours as needed for Nausea. 8 tablet 0   . pantoprazole (PROTONIX)  40 MG tablet Take 1 tablet (40 mg total) by mouth 2 (two) times daily. 60 tablet 0   . pioglitazone (ACTOS) 45 MG tablet Take 45 mg by mouth daily.     . simvastatin (ZOCOR) 40 MG tablet Take 1 tablet (40 mg total) by mouth nightly. 30 tablet 0       Past History     Past Medical History:  Past Medical History   Diagnosis Date   . No diagnosis    . Diabetes mellitus    . Hyperlipidemia    . Cerebrovascular accident      03/2015       Past Surgical History:  Past Surgical History   Procedure Laterality Date   . Appendectomy     . Egd, biopsy N/A 06/30/2015     Procedure: EGD, BIOPSY;  Surgeon: [name], MD;  Location: [facility];  Service: Gastroenterology;  Laterality: N/A;   . Egd, colonoscopy N/A 08/04/2015     Procedure: EGD, COLONOSCOPY;  Surgeon: [name], MD;  Location: [facility];  Service: Gastroenterology;  Laterality: N/A;       Family History:  History reviewed. No pertinent family history.    Social History:  Social History   Substance Use Topics   . Smoking status: Current Some Day Smoker  Types: Cigarettes   . Smokeless tobacco: None      Comment: smokes 2-3 cigaretes wekly   . Alcohol Use: Yes      Comment: occassionally       Allergies:  Allergies   Allergen Reactions   . Penicillins        Review of Systems     Review of Systems   Constitutional: Negative for appetite change.   Respiratory: Negative for shortness of breath.    Cardiovascular: Positive for chest pain.   Gastrointestinal: Positive for nausea, vomiting and abdominal pain. Negative for diarrhea and blood in stool.   Allergic/Immunologic:        Allergy to Penicillins   Neurological: Positive for dizziness and light-headedness.        + near sycnope   All other systems reviewed and are negative.    Physical Exam   BP 137/64 mmHg  Pulse 70  Temp(Src) 98.9 F (37.2 C) (Oral)  Resp 18  Ht 5\' 6"  (1.676 m)  Wt 71.668 kg  BMI 25.51 kg/m2  SpO2 100%    Physical Exam   Constitutional: Patient is oriented to person, place, and  time and well-developed, well-nourished, and in no distress.   Head: Normocephalic and atraumatic.   Eyes: EOM are normal. Pupils are equal, round, and reactive to light. Pale subconj. No scleral icterus  ENT: OP clear, sli dry mm  Neck: Normal range of motion. Neck supple. No JVD  Cardiovascular: Normal rate and regular rhythm. No murmurs or rubs.  Pulmonary/Chest: Effort normal and breath sounds normal. No respiratory distress.   Abdominal: Soft. There is no tenderness. No rebound or guarding.  Rectal exam shows brown stool, no melena, but heme occult +  Musculoskeletal: Normal range of motion. No lower extremity edema.  Neurological: Patient is alert and oriented to person, place, and time. GCS score is 15.   Skin: Skin is warm and dry.     Diagnostic Study Results     Labs -     Results     Procedure Component Value Units Date/Time    UA, Reflex to Microscopic (pts 3 + yrs) [[id number]] Collected:  04/29/16 1832    Specimen Information:  Urine Updated:  04/29/16 1901     Urine Type Clean Catch      Color, UA Yellow      Clarity, UA Clear      Specific Gravity UA 1.018      Urine pH 5.0      Leukocyte Esterase, UA Negative      Nitrite, UA Negative      Protein, UR Negative      Glucose, UA Negative      Ketones UA Negative      Urobilinogen, UA Negative mg/dL      Bilirubin, UA Negative      Blood, UA Negative     Glucose Whole Blood - POCT [[id number]]  (Abnormal) Collected:  04/29/16 1420     POCT - Glucose Whole blood 108 (H) mg/dL Updated:  78/29/56 2130    Type and Screen [[id number]] Collected:  04/29/16 1504    Specimen Information:  Blood Updated:  04/29/16 1558     ABO Rh A POS      AB Screen Gel NEG     Troponin I [[id number]] Collected:  04/29/16 1440     Troponin I <0.01 ng/mL Updated:  04/29/16 1512    Comprehensive metabolic panel (CMP) [[id number]]  (Abnormal) Collected:  04/29/16 1440  Specimen Information:  Blood Updated:  04/29/16 1510     Glucose 107 (H) mg/dL      BUN 16.1 mg/dL      Creatinine  1.6 (H) mg/dL      Sodium 096 mEq/L      Potassium 4.9 mEq/L      Chloride 106 mEq/L      CO2 23 mEq/L      Calcium 9.0 mg/dL      Protein, Total 6.8 g/dL      Albumin 3.2 (L) g/dL      AST (SGOT) 18 U/L      ALT 11 U/L      Alkaline Phosphatase 65 U/L      Bilirubin, Total 0.4 mg/dL      Globulin 3.6 g/dL      Albumin/Globulin Ratio 0.9      Anion Gap 10.0     Hemolysis index [[id number]] Collected:  04/29/16 1440     Hemolysis Index 3 Updated:  04/29/16 1510    GFR [[id number]] Collected:  04/29/16 1440     EGFR 50.3 Updated:  04/29/16 1510    Lipase [[id number]] Collected:  04/29/16 1440     Lipase 40 U/L Updated:  04/29/16 1510    CBC with differential [[id number]]  (Abnormal) Collected:  04/29/16 1440    Specimen Information:  Blood from Blood Updated:  04/29/16 1448     WBC 5.85 x10 3/uL      Hgb 10.2 (L) g/dL      Hematocrit 41.3 (L) %      Platelets 237 x10 3/uL      RBC 3.45 (L) x10 6/uL      MCV 93.0 fL      MCH 29.6 pg      MCHC 31.8 (L) g/dL      RDW 14 %      MPV 10.1 fL      Neutrophils 74.2 %      Lymphocytes Automated 17.3 %      Monocytes 6.5 %      Eosinophils Automated 1.4 %      Basophils Automated 0.3 %      Immature Granulocyte 0.3 %      Nucleated RBC 0.0 /100 WBC      Neutrophils Absolute 4.34 x10 3/uL      Abs Lymph Automated 1.01 x10 3/uL      Abs Mono Automated 0.38 x10 3/uL      Abs Eos Automated 0.08 x10 3/uL      Absolute Baso Automated 0.02 x10 3/uL      Absolute Immature Granulocyte 0.02 x10 3/uL      Absolute NRBC 0.00 x10 3/uL           Radiologic Studies -   Radiology Results (24 Hour)     Procedure Component Value Units Date/Time    CT Abd/Pelvis without Contrast [[id number]] Collected:  04/29/16 1552    Order Status:  Completed Updated:  04/29/16 1605    Narrative:      CT ABDOMEN PELVIS WO IV/ WO PO CONT    CLINICAL INDICATION: mid abd pain, lightheaded, acute renal insuff      COMPARISON: 03/05/2015    TECHNIQUE: Helical CT scan through the abdomen and pelvis was obtained  from the  dome of the diaphragm to the symphysis pubis without IV  contrast. No oral contrast.    Note that CT scanning at this site  utilizes multiple dose reduction  techniques including automatic  exposure control, adjustment of the MAS  and/or KVP according to patient's size and use of iterative  reconstruction technique          FINDINGS:    . Absence of contrast limits the evaluation of the solid  organs and intraperitoneal contents. The unenhanced liver, spleen,  pancreas, ,gallbladder, and adrenal glands are within normal limits for  age. No hydronephrosis.    Diverticulosis of the descending and sigmoid colon noted. Thickening of  the mid sigmoid colon wall noted. No surrounding inflammatory changes.  There is no bowel obstruction or abnormal bowel dilatation. The appendix  is not visualized.    There is no ascites or abnormal fluid collection.          Calcified plaque of the abdominal aorta is noted. No retroperitoneal  mass or adenopathy is present. The lumbar spine reveals multilevel  degenerative changes with central stenosis noted at L4/L5. No fracture..    The urinary bladder wall appears circumferentially thickened although  the urinary bladder is not well distended., The prostate is highly  enlarged. Seminal vesicles within normal limits.        Impression:         1. Diverticulosis. Circumferential thickening of the mid sigmoid colon  without surrounding inflammatory changes. This is not specific but  presumably postinflammatory. No evidence of acute diverticulitis or  obstruction.  2. No hydronephrosis.  3. Urinary bladder circumferential wall thickening. This may be related  to non distention. Prominent size prostate.    Heron Nay, MD   04/29/2016 4:01 PM      CT Chest without Contrast [[id number]] Collected:  04/29/16 1551    Order Status:  Completed Updated:  04/29/16 1559    Narrative:      Chest CT without IV contrast    Clinical history: Dizziness upper abdominal pain    Comparison: Chest CT  03/21/2015    Technique: Helical CT images of the chest were obtained without IV  contrast. The data were used to reconstruct sagittal and coronal  images.A combination of automatic exposure control, adjustment of the mA  and/or kV    according to patient size and/or use of iterative reconstruction  technique was   utilized.    Findings  Thyroid : Normal.    Lymph nodes: Subcentimeter mediastinal lymph nodes which are unchanged  from prior. No definite hilar lymphadenopathy within limits of this  noncontrast exam.    Heart: Heart is normal in size, no pericardial effusion.  .        Vessels: Thoracic aorta is normal in caliber.      Esophagus: Diffuse esophageal wall thickening.    Airway and lungs: Trachea and mainstem bronchi are patent.. No  consolidation or pleural effusion.. No suspicious lung nodules.    Upper abdomen: Within normal limits. Reported separately    Bones: Degenerative changes in the thoracic spine.        Impression:        No acute findings in chest.    Lorinda Creed, MD   04/29/2016 3:55 PM        .    Medical Decision Making   I am the first provider for this patient.    I reviewed the vital signs, available nursing notes, past medical history, past surgical history, family history and social history.    Vital Signs-Reviewed the patient's vital signs.     Patient Vitals for the past 12 hrs:   BP Temp Pulse Resp  04/29/16 1832 137/64 mmHg 98.9 F (37.2 C) 70 18   04/29/16 1752 111/59 mmHg - 76 18   04/29/16 1603 109/51 mmHg - 66 -   04/29/16 1600 - - 68 -   04/29/16 1558 101/58 mmHg 98.1 F (36.7 C) 72 -   04/29/16 1430 - - 72 -   04/29/16 1414 91/54 mmHg - 76 18   04/29/16 1359 (!) 78/45 mmHg 98.5 F (36.9 C) 81 -       Pulse Oximetry Analysis - Normal 100% on RA    Cardiac Monitor:  Rate: 80  Rhythm:  Normal Sinus Rhythm    EKG:  Interpreted by the EP.   Time Interpreted: 1411   Rate: 76   Rhythm: Normal Sinus Rhythm   Interpretation: Normal EKG   Comparison: Not needed for  normal.        Old Medical Records: Old medical records. Nursing notes. Admitted July last year for dizziness as well as dehydration. Endoscopy showed no bleeding.    ED Course:     2:50 PM - No Melena but hemoccult positive. Discussed treatment plan with pt. Pt is agreeable with plan.    3:20 PM - Updated on results thus far.   CT tech says he sees something on scout film on chest and suggests a chest CT so will order noncon chest    4:57 PM - Updated on results thus far.    7:18 PM - Pt would like to go home. Will f/u with Dr. Selena Batten. Would like some food as well.    7:30 PM - Will observe pt until sugar is better. Pt was hypoglycemic when in the ED. Likely because he was NPO due to concern of GI bleed.    7:32 PM - Oral DM meds, only takes it once a day. He is eating currently.    7:59 PM - Pt ate a [place] sandwich and apple juice. Discussed results with pt and counseled on diagnosis, f/u plans, medication use, and signs and symptoms when to return to ED.  Pt is stable and ready for discharge. Glu ok now. Pt awake and alert, denies dizziness. States his BS was 45 this morning. He though he was to take an extra dose of his DM if his sugar level was low. I discussed no DM meds until he sees his pmd tomorrow to follow up on his sugar monitoring. Aware he needs to eat a full meal when he gets home (ate a sandwich) and to check his fsbs before bedtime. Pt again did not want to be hospitalized    April 30, 2016, 8:50p- I called pt who states he is doing well without any lightheadedness. Eating and drinking ok, no hypoglycemia. He states he did not call his pmd bc he "did not want to bother him". I told pt he needs to see his pmd next week for repeat eval and blood testing as he had some mild renal insuff likely from dehydration. He states he will call. rediscussed not taking his oral DM meds if he is hypoglycemic    Provider Notes: here for lightheadedness and near syncope without cp or sob in context of poor PO. Mild  renal insuff and hypotension. BP better after IVF as well as sx. Initial glu ok but then became hypoglycemic. This resolved after eating. Pt awake and alert the entire time. Has chronic anemia better than baseline. Denies GI bleed sx but given protonix as hx of GIB and heme occult + stool (  but no melena). Pt preferred outpt follow up. Doubt acs, stroke        Diagnosis     Clinical Impression:   1. Near syncope    2. Acute renal insufficiency    3. Dehydration    4. Chronic anemia    5. Occult blood positive stool        Treatment Plan:   ED Disposition     Discharge Luc Rudd discharge to home/self care.    Condition at disposition: Stable              _______________________________      Attestations: This note is prepared by Georgina Quint, acting as scribe for Marquette Old, MD.    Marquette Old, MD - The scribe's documentation has been prepared under my direction and personally reviewed by me in its entirety.  I confirm that the note above accurately reflects all work, treatment, procedures, and medical decision making performed by me.    _______________________________        Marquette Old, MD  04/30/16 2056

## 2016-04-29 NOTE — ED Notes (Signed)
Patient states " I feel better, not feeling dizzy now"

## 2016-04-29 NOTE — ED Notes (Signed)
[name] is a 82 y.o. male that presents to the [facility] ED with c/c of: dizziness that started today, 4 hours ago while sitting at a desk at work. Patient states has hx of diabetes. Pt states blood sugar at work was 120. Denies CP/SOB. Blood Sugar now is 108.

## 2016-06-19 ENCOUNTER — Inpatient Hospital Stay
Admission: EM | Admit: 2016-06-19 | Discharge: 2016-06-21 | DRG: 315 | Disposition: A | Discharge status: Home / Self Care | Payer: Medicare (Managed Care) | Attending: Internal Medicine | Admitting: Internal Medicine

## 2016-06-19 ENCOUNTER — Emergency Department: Payer: Medicare (Managed Care)

## 2016-06-19 ENCOUNTER — Inpatient Hospital Stay: Payer: Medicare (Managed Care) | Admitting: Internal Medicine

## 2016-06-19 DIAGNOSIS — Z8673 Personal history of transient ischemic attack (TIA), and cerebral infarction without residual deficits: Secondary | ICD-10-CM

## 2016-06-19 DIAGNOSIS — E785 Hyperlipidemia, unspecified: Secondary | ICD-10-CM | POA: Diagnosis present

## 2016-06-19 DIAGNOSIS — I959 Hypotension, unspecified: Principal | ICD-10-CM | POA: Diagnosis present

## 2016-06-19 DIAGNOSIS — E860 Dehydration: Secondary | ICD-10-CM | POA: Diagnosis present

## 2016-06-19 DIAGNOSIS — D649 Anemia, unspecified: Secondary | ICD-10-CM | POA: Diagnosis present

## 2016-06-19 DIAGNOSIS — N179 Acute kidney failure, unspecified: Secondary | ICD-10-CM | POA: Diagnosis present

## 2016-06-19 DIAGNOSIS — E872 Acidosis: Secondary | ICD-10-CM | POA: Diagnosis present

## 2016-06-19 DIAGNOSIS — I9589 Other hypotension: Secondary | ICD-10-CM

## 2016-06-19 DIAGNOSIS — E11649 Type 2 diabetes mellitus with hypoglycemia without coma: Secondary | ICD-10-CM | POA: Diagnosis present

## 2016-06-19 DIAGNOSIS — Z7984 Long term (current) use of oral hypoglycemic drugs: Secondary | ICD-10-CM

## 2016-06-19 DIAGNOSIS — F17200 Nicotine dependence, unspecified, uncomplicated: Secondary | ICD-10-CM | POA: Diagnosis present

## 2016-06-19 DIAGNOSIS — I10 Essential (primary) hypertension: Secondary | ICD-10-CM | POA: Diagnosis present

## 2016-06-19 LAB — URINALYSIS, REFLEX TO MICROSCOPIC EXAM IF INDICATED
Bilirubin, UA: NEGATIVE
Blood, UA: NEGATIVE
Glucose, UA: NEGATIVE
Leukocyte Esterase, UA: NEGATIVE
Nitrite, UA: NEGATIVE
Protein, UR: 30 — AB
Specific Gravity UA: 1.025 (ref 1.001–1.035)
Urine pH: 5 (ref 5.0–8.0)
Urobilinogen, UA: 2 mg/dL (ref 0.2–2.0)

## 2016-06-19 LAB — CBC AND DIFFERENTIAL
Absolute NRBC: 0 10*3/uL
Basophils Absolute Automated: 0.03 10*3/uL (ref 0.00–0.20)
Basophils Automated: 0.5 %
Eosinophils Absolute Automated: 0.09 10*3/uL (ref 0.00–0.70)
Eosinophils Automated: 1.5 %
Hematocrit: 33.3 % — ABNORMAL LOW (ref 42.0–52.0)
Hgb: 10.4 g/dL — ABNORMAL LOW (ref 13.0–17.0)
Immature Granulocytes Absolute: 0.03 10*3/uL
Immature Granulocytes: 0.5 %
Lymphocytes Absolute Automated: 1.45 10*3/uL (ref 0.50–4.40)
Lymphocytes Automated: 24.2 %
MCH: 29.5 pg (ref 28.0–32.0)
MCHC: 31.2 g/dL — ABNORMAL LOW (ref 32.0–36.0)
MCV: 94.6 fL (ref 80.0–100.0)
MPV: 10.8 fL (ref 9.4–12.3)
Monocytes Absolute Automated: 0.35 10*3/uL (ref 0.00–1.20)
Monocytes: 5.8 %
Neutrophils Absolute: 4.05 10*3/uL (ref 1.80–8.10)
Neutrophils: 67.5 %
Nucleated RBC: 0 /100 WBC (ref 0.0–1.0)
Platelets: 235 10*3/uL (ref 140–400)
RBC: 3.52 10*6/uL — ABNORMAL LOW (ref 4.70–6.00)
RDW: 16 % — ABNORMAL HIGH (ref 12–15)
WBC: 6 10*3/uL (ref 3.50–10.80)

## 2016-06-19 LAB — HEMOLYSIS INDEX: Hemolysis Index: 15 (ref 0–18)

## 2016-06-19 LAB — COMPREHENSIVE METABOLIC PANEL
ALT: 8 U/L (ref 0–55)
AST (SGOT): 18 U/L (ref 5–34)
Albumin/Globulin Ratio: 0.9 (ref 0.9–2.2)
Albumin: 3.3 g/dL — ABNORMAL LOW (ref 3.5–5.0)
Alkaline Phosphatase: 66 U/L (ref 38–106)
Anion Gap: 10 (ref 5.0–15.0)
BUN: 22 mg/dL (ref 9.0–28.0)
Bilirubin, Total: 0.4 mg/dL (ref 0.2–1.2)
CO2: 23 mEq/L (ref 22–29)
Calcium: 9 mg/dL (ref 7.9–10.2)
Chloride: 106 mEq/L (ref 100–111)
Creatinine: 1.7 mg/dL — ABNORMAL HIGH (ref 0.7–1.3)
Globulin: 3.7 g/dL — ABNORMAL HIGH (ref 2.0–3.6)
Glucose: 122 mg/dL — ABNORMAL HIGH (ref 70–100)
Potassium: 4.7 mEq/L (ref 3.5–5.1)
Protein, Total: 7 g/dL (ref 6.0–8.3)
Sodium: 139 mEq/L (ref 136–145)

## 2016-06-19 LAB — POCT OCCULT BLOOD STOOL: Stool Occult Blood: NEGATIVE

## 2016-06-19 LAB — RAPID DRUG SCREEN, URINE
Barbiturate Screen, UR: NEGATIVE
Benzodiazepine Screen, UR: NEGATIVE
Cannabinoid Screen, UR: NEGATIVE
Cocaine, UR: NEGATIVE
Opiate Screen, UR: NEGATIVE
PCP Screen, UR: NEGATIVE
Urine Amphetamine Screen: NEGATIVE

## 2016-06-19 LAB — LIPASE: Lipase: 39 U/L (ref 8–78)

## 2016-06-19 LAB — BLOOD GAS, VENOUS
Base Excess, Ven: -1.4 mEq/L
HCO3, Ven: 24 mEq/L
O2 Sat, Venous: 48.4 %
Temperature: 37
Venous Total CO2: 22.3 mEq/L
pCO2, Venous: 46 mmhg
pH, Ven: 7.338
pO2, Venous: 31.2 mmhg

## 2016-06-19 LAB — B-TYPE NATRIURETIC PEPTIDE: B-Natriuretic Peptide: 40 pg/mL (ref 0–100)

## 2016-06-19 LAB — TYPE AND SCREEN
AB Screen Gel: NEGATIVE
ABO Rh: A POS

## 2016-06-19 LAB — GLUCOSE WHOLE BLOOD - POCT: Whole Blood Glucose POCT: 137 mg/dL — ABNORMAL HIGH (ref 70–100)

## 2016-06-19 LAB — TROPONIN I: Troponin I: 0.01 ng/mL (ref 0.00–0.09)

## 2016-06-19 LAB — GFR: EGFR: 46.9

## 2016-06-19 LAB — LACTIC ACID, PLASMA: Lactic Acid: 2 mmol/L (ref 0.2–2.0)

## 2016-06-19 MED ORDER — GLUCAGON 1 MG IJ SOLR (WRAP)
1.0000 mg | INTRAMUSCULAR | Status: DC | PRN
Start: 2016-06-19 — End: 2016-06-21

## 2016-06-19 MED ORDER — FAMOTIDINE 10 MG/ML IV SOLN (WRAP)
10.0000 mg | Freq: Two times a day (BID) | INTRAVENOUS | Status: DC
Start: 2016-06-19 — End: 2016-06-21

## 2016-06-19 MED ORDER — ACETAMINOPHEN 325 MG PO TABS
650.0000 mg | ORAL_TABLET | ORAL | Status: DC | PRN
Start: 2016-06-19 — End: 2016-06-21

## 2016-06-19 MED ORDER — VANCOMYCIN HCL 1000 MG IV SOLR
1500.0000 mg | Freq: Once | INTRAVENOUS | Status: AC
Start: 2016-06-19 — End: 2016-06-20
  Administered 2016-06-19: 1500 mg via INTRAVENOUS
  Filled 2016-06-19: qty 1500

## 2016-06-19 MED ORDER — SODIUM CHLORIDE 0.9 % IV BOLUS
30.0000 mL/kg | Freq: Once | INTRAVENOUS | Status: AC
Start: 2016-06-19 — End: 2016-06-19
  Administered 2016-06-19: 2163 mL via INTRAVENOUS

## 2016-06-19 MED ORDER — ACETAMINOPHEN 650 MG RE SUPP
650.0000 mg | RECTAL | Status: DC | PRN
Start: 2016-06-19 — End: 2016-06-21

## 2016-06-19 MED ORDER — ASPIRIN 81 MG PO CHEW
81.0000 mg | CHEWABLE_TABLET | Freq: Every day | ORAL | Status: DC
Start: 2016-06-20 — End: 2016-06-21
  Administered 2016-06-20 – 2016-06-21 (×2): 81 mg via ORAL
  Filled 2016-06-19 (×2): qty 1

## 2016-06-19 MED ORDER — MORPHINE SULFATE 4 MG/ML IJ/IV SOLN (WRAP)
4.0000 mg | Status: AC | PRN
Start: 2016-06-19 — End: 2016-06-20

## 2016-06-19 MED ORDER — FAMOTIDINE 20 MG PO TABS
10.0000 mg | ORAL_TABLET | Freq: Two times a day (BID) | ORAL | Status: DC
Start: 2016-06-19 — End: 2016-06-21
  Administered 2016-06-19 – 2016-06-21 (×4): 10 mg via ORAL
  Filled 2016-06-19 (×4): qty 1

## 2016-06-19 MED ORDER — INSULIN LISPRO 100 UNIT/ML SC SOLN
1.0000 [IU] | Freq: Three times a day (TID) | SUBCUTANEOUS | Status: DC | PRN
Start: 2016-06-19 — End: 2016-06-21
  Administered 2016-06-21: 1 [IU] via SUBCUTANEOUS
  Filled 2016-06-19 (×2): qty 3

## 2016-06-19 MED ORDER — SODIUM CHLORIDE 0.9 % IV MBP
2.0000 g | Freq: Once | INTRAVENOUS | Status: AC
Start: 2016-06-19 — End: 2016-06-19
  Administered 2016-06-19: 2 g via INTRAVENOUS
  Filled 2016-06-19: qty 2

## 2016-06-19 MED ORDER — INSULIN LISPRO 100 UNIT/ML SC SOLN
1.0000 [IU] | Freq: Every evening | SUBCUTANEOUS | Status: DC | PRN
Start: 2016-06-19 — End: 2016-06-21

## 2016-06-19 MED ORDER — SODIUM CHLORIDE 0.9 % IV SOLN
100.0000 mL/h | INTRAVENOUS | Status: AC
Start: 2016-06-19 — End: 2016-06-20
  Administered 2016-06-19: 100 mL/h via INTRAVENOUS

## 2016-06-19 MED ORDER — ACETAMINOPHEN 325 MG PO TABS
650.0000 mg | ORAL_TABLET | Freq: Four times a day (QID) | ORAL | Status: AC | PRN
Start: 2016-06-19 — End: 2016-06-20

## 2016-06-19 MED ORDER — NALOXONE HCL 0.4 MG/ML IJ SOLN (WRAP)
0.2000 mg | INTRAMUSCULAR | Status: DC | PRN
Start: 2016-06-19 — End: 2016-06-21

## 2016-06-19 MED ORDER — DEXTROSE 10 % IV BOLUS
125.0000 mL | INTRAVENOUS | Status: DC | PRN
Start: 2016-06-19 — End: 2016-06-21

## 2016-06-19 MED ORDER — CALCIUM GLUCONATE 10 % IV SOLN
1.0000 g | INTRAVENOUS | Status: DC
Start: 2016-06-19 — End: 2016-06-19
  Administered 2016-06-19 (×2): 1 g via INTRAVENOUS
  Filled 2016-06-19 (×2): qty 10

## 2016-06-19 MED ORDER — BRIMONIDINE TARTRATE 0.2 % OP SOLN
1.0000 [drp] | Freq: Two times a day (BID) | OPHTHALMIC | Status: DC
Start: 2016-06-19 — End: 2016-06-21
  Administered 2016-06-19 – 2016-06-21 (×4): 1 [drp] via OPHTHALMIC
  Filled 2016-06-19: qty 5

## 2016-06-19 MED ORDER — GLUCOSE 40 % PO GEL
15.0000 g | ORAL | Status: DC | PRN
Start: 2016-06-19 — End: 2016-06-21

## 2016-06-19 MED ORDER — ONDANSETRON HCL 4 MG/2ML IJ SOLN
4.0000 mg | Freq: Three times a day (TID) | INTRAMUSCULAR | Status: AC | PRN
Start: 2016-06-19 — End: 2016-06-20

## 2016-06-19 MED ORDER — HEPARIN SODIUM (PORCINE) 5000 UNIT/ML IJ SOLN
5000.0000 [IU] | Freq: Two times a day (BID) | INTRAMUSCULAR | Status: DC
Start: 2016-06-20 — End: 2016-06-21
  Administered 2016-06-20 – 2016-06-21 (×3): 5000 [IU] via SUBCUTANEOUS
  Filled 2016-06-19 (×3): qty 1

## 2016-06-19 MED ORDER — SIMVASTATIN 40 MG PO TABS
40.0000 mg | ORAL_TABLET | Freq: Every evening | ORAL | Status: DC
Start: 2016-06-19 — End: 2016-06-21
  Administered 2016-06-19 – 2016-06-20 (×2): 40 mg via ORAL
  Filled 2016-06-19 (×2): qty 1

## 2016-06-19 NOTE — ED Notes (Addendum)
[name] is a 82 y.o. male Independent with ADLs.A&OX4.Used urinal in ED. Last 1L NS bolus is currently running    If any questions please call Areg Bialas [phone]

## 2016-06-19 NOTE — ED Provider Notes (Signed)
EMERGENCY DEPARTMENT HISTORY AND PHYSICAL EXAM     Physician/Midlevel provider first contact with patient: 06/19/16 2005         Date: 06/19/2016  Patient Name: [name]    History of Presenting Illness     Chief Complaint   Patient presents with   . Hypotension   . Dizziness       History Provided By: Patient    Chief Complaint: Dizziness  Onset: 3 hours ago  Timing: Constant  Location: Generalized  Quality: "Room spinning"  Radiation: None  Severity: Moderate  Exacerbating factors: None  Alleviating factors: No medication tried  Associated Symptoms: Dry throat  Pertinent Negatives: No LOC, CP, abdominal pain, vomiting, diarrhea, bloody or black stool, nausea    Additional History: [name] is a 82 y.o. male with PMH of DM, hyperlipidemia, CVA, c/o dizziness starting 3 hours ago. Pt states that he feels 'like the room is spinning'. Pt also c/o a "dry throat." He states he has not done anything differently today and has been taking his home medications as usual. Pt denies LOC, CP, abdominal pain, vomiting, diarrhea, bloody or black stool, nausea, or positive sick contact. Pt takes medication for DM, but states that his sugar levels have been normal.     Previous hospitalization in 2016 for GIB, but negative EGD (see records review section).      PCP: [name], MD    Current Facility-Administered Medications   Medication Dose Route Frequency Provider Last Rate Last Dose   . aztreonam (AZACTAM) 2 g in sodium chloride 0.9 % 100 mL IVPB mini-bag plus  2 g Intravenous Once Juliane Poot, MD PHD 200 mL/hr at 06/19/16 2141 2 g at 06/19/16 2141   . calcium GLUConate 1 g in sodium chloride 0.9 % 100 mL IVPB  1 g Intravenous Q1H Juliane Poot, MD PHD 100 mL/hr at 06/19/16 2104 1 g at 06/19/16 2104   . vancomycin (VANCOCIN) 1,500 mg in sodium chloride 0.9 % 500 mL IVPB  1,500 mg Intravenous Once in ED Juliane Poot, MD PHD         Current Outpatient Prescriptions   Medication Sig Dispense Refill    . aspirin 81 MG tablet Take 81 mg by mouth daily.     . brimonidine (ALPHAGAN P) 0.1 % Solution Place 1 drop into both eyes 2 (two) times daily.        . metFORMIN (GLUCOPHAGE) 1000 MG tablet Take 1 tablet (1,000 mg total) by mouth every morning with breakfast.     . ondansetron (ZOFRAN-ODT) 4 MG disintegrating tablet Take 1 tablet (4 mg total) by mouth every 6 (six) hours as needed for Nausea. 8 tablet 0   . iron-vitamin C (VITRON C) 65-125 MG Tab Take 1 tablet by mouth 2 (two) times daily. 60 tablet 0   . pantoprazole (PROTONIX) 40 MG tablet Take 1 tablet (40 mg total) by mouth 2 (two) times daily. 60 tablet 0   . simvastatin (ZOCOR) 40 MG tablet Take 1 tablet (40 mg total) by mouth nightly. 30 tablet 0       Past History     Past Medical History:  Past Medical History   Diagnosis Date   . No diagnosis    . Diabetes mellitus    . Hyperlipidemia    . Cerebrovascular accident      03/2015       Past Surgical History:  Past Surgical History   Procedure Laterality Date   .  Appendectomy     . Egd, biopsy N/A 06/30/2015     Procedure: EGD, BIOPSY;  Surgeon: [name], MD;  Location: [facility];  Service: Gastroenterology;  Laterality: N/A;   . Egd, colonoscopy N/A 08/04/2015     Procedure: EGD, COLONOSCOPY;  Surgeon: [name], MD;  Location: [facility];  Service: Gastroenterology;  Laterality: N/A;       Family History:  No pertinent history.    Social History:  Social History   Substance Use Topics   . Smoking status: Current Some Day Smoker     Types: Cigarettes   . Smokeless tobacco: None      Comment: smokes 2-3 cigaretes wekly   . Alcohol Use: Yes      Comment: occassionally       Allergies:  Allergies   Allergen Reactions   . Penicillins        Review of Systems     Review of Systems   Constitutional: Negative for fever and chills.   HENT: Negative for congestion, sore throat, trouble swallowing and voice change.         + dry throat   Eyes: Negative for redness and visual disturbance.   Respiratory:  Negative for cough, chest tightness and shortness of breath.    Cardiovascular: Negative for chest pain, palpitations and leg swelling.   Gastrointestinal: Negative for nausea, vomiting, abdominal pain, diarrhea and blood in stool.   Endocrine: Negative for polydipsia and polyuria.   Genitourinary: Negative for dysuria, urgency, hematuria and flank pain.   Musculoskeletal: Negative for back pain and neck pain.   Skin: Negative for color change and rash.   Allergic/Immunologic: Negative for immunocompromised state.   Neurological: Positive for dizziness. Negative for syncope, weakness and numbness.   Hematological: Negative for adenopathy.   Psychiatric/Behavioral: Negative for confusion and agitation.   All other systems reviewed and are negative.      Physical Exam   BP 111/53 mmHg  Pulse 64  Temp(Src) 97.3 F (36.3 C) (Oral)  Resp 16  Ht 5\' 9"  (1.753 m)  Wt 72.122 kg  BMI 23.47 kg/m2  SpO2 100%    Physical Exam   Constitutional: He is oriented to person, place, and time. He appears well-developed and well-nourished. No distress.   HENT:   Head: Normocephalic and atraumatic.   Dry MM   Eyes: Conjunctivae are normal. Pupils are equal, round, and reactive to light.   Neck: Normal range of motion. Neck supple. No JVD present.   Cardiovascular: Normal rate, regular rhythm, normal heart sounds and intact distal pulses.    Pulmonary/Chest: Effort normal and breath sounds normal. No respiratory distress.   Abdominal: Soft. Bowel sounds are normal. He exhibits no distension. There is no tenderness.   Genitourinary: Guaiac negative stool.   Musculoskeletal: Normal range of motion. He exhibits no edema or tenderness.   Lymphadenopathy:     He has no cervical adenopathy.   Neurological: He is alert and oriented to person, place, and time. No cranial nerve deficit. He exhibits normal muscle tone.   Skin: Skin is warm and dry. No rash noted. He is not diaphoretic.   Psychiatric: He has a normal mood and affect. His  behavior is normal.   Nursing note and vitals reviewed.         Diagnostic Study Results     Labs -     Results     Procedure Component Value Units Date/Time    Urine Rapid Drug  Screen [[id number]] Collected:  06/19/16 2133    Specimen Information:  Urine Updated:  06/19/16 2209     Amphetamine Screen, UR Negative      Barbiturate Screen, UR Negative      Benzodiazepine Screen, UR Negative      Cannabinoid Screen, UR Negative      Cocaine, UR Negative      Opiate Screen, UR Negative      PCP Screen, UR Negative     Stool Occult Blood (Point of Care) [[id number]] Collected:  06/19/16 2030    Specimen Information:  Stool Updated:  06/19/16 2203     POCT QC Pass      Stool Occult Blood Negative     UA, Reflex to Microscopic (pts 3 + yrs) [[id number]]  (Abnormal) Collected:  06/19/16 2133    Specimen Information:  Urine Updated:  06/19/16 2159     Urine Type Clean Catch      Color, UA Amber (A)      Clarity, UA Clear      Specific Gravity UA 1.025      Urine pH 5.0      Leukocyte Esterase, UA Negative      Nitrite, UA Negative      Protein, UR 30 (A)      Glucose, UA Negative      Ketones UA Trace (A)      Urobilinogen, UA 2.0 mg/dL      Bilirubin, UA Negative      Blood, UA Negative      RBC, UA 6 - 10 (A) /hpf      WBC, UA 0 - 5 /hpf      Hyaline Casts, UA 11 - 25 (A) /lpf      Urine Mucus Present     Type and Screen [[id number]] Collected:  06/19/16 2024    Specimen Information:  Blood Updated:  06/19/16 2140     ABO Rh A POS      AB Screen Gel NEG     Urine culture [[id number]] Collected:  06/19/16 2133    Specimen Information:  Urine from Urine, Clean Catch Updated:  06/19/16 2133    B-type Natriuretic Peptide (BNP) [[id number]] Collected:  06/19/16 2024    Specimen Information:  Blood Updated:  06/19/16 2104     B-Natriuretic Peptide 40 pg/mL     Troponin I [[id number]] Collected:  06/19/16 2024    Specimen Information:  Blood Updated:  06/19/16 2104     Troponin I 0.01 ng/mL     Comprehensive Metabolic Panel (CMP)  [[id number]]  (Abnormal) Collected:  06/19/16 2024    Specimen Information:  Blood Updated:  06/19/16 2058     Glucose 122 (H) mg/dL      BUN 20.2 mg/dL      Creatinine 1.7 (H) mg/dL      Sodium 542 mEq/L      Potassium 4.7 mEq/L      Chloride 106 mEq/L      CO2 23 mEq/L      Calcium 9.0 mg/dL      Protein, Total 7.0 g/dL      Albumin 3.3 (L) g/dL      AST (SGOT) 18 U/L      ALT 8 U/L      Alkaline Phosphatase 66 U/L      Bilirubin, Total 0.4 mg/dL      Globulin 3.7 (H) g/dL      Albumin/Globulin Ratio 0.9      Anion Gap  10.0     Lipase [[id number]] Collected:  06/19/16 2024    Specimen Information:  Blood Updated:  06/19/16 2058     Lipase 39 U/L     Hemolysis index [[id number]] Collected:  06/19/16 2024     Hemolysis Index 15 Updated:  06/19/16 2058    GFR [[id number]] Collected:  06/19/16 2024     EGFR 46.9 Updated:  06/19/16 2058    Venous Blood Gas [[id number]] Collected:  06/19/16 2023    Specimen Information:  Blood Updated:  06/19/16 2046     pH, Ven 7.338      pCO2, Venous 46.0 mmhg      pO2, Venous 31.2 mmhg      HCO3, Ven 24.0 mEq/L      Venous Total CO2 22.3 mEq/L      Base Excess, Ven -1.4 mEq/L      O2 Sat, Venous 48.4 %      Temperature 37.0      VBG CollectionSite Heelstick     Lactic acid, plasma [[id number]] Collected:  06/19/16 2023     Lactic acid 2.0 mmol/L Updated:  06/19/16 2046    CBC with differential [[id number]]  (Abnormal) Collected:  06/19/16 2024    Specimen Information:  Blood from Blood Updated:  06/19/16 2043     WBC 6.00 x10 3/uL      Hgb 10.4 (L) g/dL      Hematocrit 25.4 (L) %      Platelets 235 x10 3/uL      RBC 3.52 (L) x10 6/uL      MCV 94.6 fL      MCH 29.5 pg      MCHC 31.2 (L) g/dL      RDW 16 (H) %      MPV 10.8 fL      Neutrophils 67.5 %      Lymphocytes Automated 24.2 %      Monocytes 5.8 %      Eosinophils Automated 1.5 %      Basophils Automated 0.5 %      Immature Granulocyte 0.5 %      Nucleated RBC 0.0 /100 WBC      Neutrophils Absolute 4.05 x10 3/uL      Abs Lymph  Automated 1.45 x10 3/uL      Abs Mono Automated 0.35 x10 3/uL      Abs Eos Automated 0.09 x10 3/uL      Absolute Baso Automated 0.03 x10 3/uL      Absolute Immature Granulocyte 0.03 x10 3/uL      Absolute NRBC 0.00 x10 3/uL     Beta-Hydroxybutyrate [[id number]] Collected:  06/19/16 2024     Updated:  06/19/16 2039    Blood culture #1 [[id number]] Collected:  06/19/16 2025    Specimen Information:  Blood from Blood Updated:  06/19/16 2025    Narrative:      1 BLUE+1 PURPLE    Blood culture #2 [[id number]] Collected:  06/19/16 2024    Specimen Information:  Blood from Blood Updated:  06/19/16 2024    Narrative:      1 BLUE+1 PURPLE    Glucose Whole Blood - POCT [[id number]]  (Abnormal) Collected:  06/19/16 2013     POCT - Glucose Whole blood 137 (H) mg/dL Updated:  35/00/93 8182          Radiologic Studies -   Radiology Results (24 Hour)     Procedure Component Value Units Date/Time    CT Head without  Contrast [[id number]] Collected:  06/19/16 2105    Order Status:  Completed Updated:  06/19/16 2110    Narrative:      INDICATION:  Cerebral ischemia    TECHNIQUE:  Noncontrast imaging    COMPARISON: 08/01/2015    FINDINGS:  There are again identified to be some age-related small  vessel white matter changes and mild to moderate atrophy. Overall the  findings were stable as compared to the prior study. There are no  findings for acute intra-axial or extra-axial abnormalities noted.      Impression:       Stable head CT with age-appropriate findings. No acute  abnormalities.        Note: A combination of automatic exposure control, adjustment of the MA  and/or KV according to patient size and/or use of iterative  reconstructive technique was utilized.      Lorenda Peck, MD   06/19/2016 9:06 PM      Chest AP Portable [[id number]] Collected:  06/19/16 2100    Order Status:  Completed Updated:  06/19/16 2105    Narrative:      HISTORY::Sepsis evaluate for pneumonia    TECHNIQUE: An AP portable chest performed at 2050  hours    FINDINGS: The lungs are clear of acute focal infiltrates.  The heart,  mediastinum and bony thorax are unremarkable for age and AP technique.      Impression:       No radiographic evidence for acute cardiopulmonary disease.    Lorenda Peck, MD   06/19/2016 9:01 PM        .      Medical Decision Making   I am the first provider for this patient.    I reviewed the vital signs, available nursing notes, past medical history, past surgical history, family history and social history.    Vital Signs: Reviewed the patient's vital signs.     Patient Vitals for the past 12 hrs:   BP Temp Pulse Resp   06/19/16 2136 111/53 mmHg 97.3 F (36.3 C) 64 16   06/19/16 2033 96/54 mmHg - 75 16   06/19/16 2003 (!) 70/42 mmHg 97.3 F (36.3 C) 74 16       Pulse Oximetry Analysis:  Normal 100% on RA    Cardiac Monitor:  Rate: 65 bpm  Rhythm:  Normal Sinus Rhythm     EKG:  Interpreted by the Emergency Physician.   Time Interpreted: 2011   Rate: 71   Rhythm: Normal Sinus Rhythm    Interpretation: QRS 84, QTc 404, non-specific ST changes   Comparison: Unchanged from 04/29/16 (non-specific ST changes)     Critical Care Time:   CRITICAL CARE: The high probability of sudden, clinically significant deterioration in the patient's condition required the highest level of my preparedness to intervene urgently.    The services I provided to this patient were to treat and/or prevent clinically significant deterioration that could result in: permanent disability or death.  Services included the following: chart data review, reviewing nursing notes and/or old charts, documentation time, consultant collaboration regarding findings and treatment options, medication orders and management, direct patient care, re-evaluations, vital sign assessments and ordering, interpreting and reviewing diagnostic studies/lab tests.    Aggregate critical care time was 47 minutes, which includes only time during which I was engaged in work directly related to the  patient's care, as described above, whether at the bedside or elsewhere in the Emergency Department.  It did not include time  spent performing other reported procedures or the services of residents, students, nurses or physician assistants.      Admit Decision Time:  The decision to admit this patient was made by the emergency provider at 9:33 PM on 06/19/2016     Old Medical Records: Old medical records. Previous electrocardiograms. Nursing notes. Pt's last admission was 08/01/15 for melena, acute blood loss, and syncope from a GI bleed. Pt had an EGD done in August, 2016 which reported erosive enteritis. His repeat EGD was normal.     ED Course:   8:06 PM - Discussed plan for CT scan, CXR, labwork. Pt is agreeable.     8:29 PM - Pt reassessed. Dizziness is slightly improved. SBP in 110s. Answered questions from pt's cousin. Pt and cousin understand need for hospitalization.     9:03 PM - Pt re-evaluated, remains VSS, NAD.    9:19 PM - Discussed CT/CXR with pt/family and updated on results. Pt is resting comfortably and requesting water.     9:33 PM - Discussed pt case with Dr. Griffin Dakin, internal medicine, who agrees to admit for Dr.Chauhan      Provider Notes: 80M with dizziness x several hours. SBP 70s on arrival but responded well to fluids. Clinically dehydrated, exam otherwise without red flags and DRE guaiac negative. Pt aggressively resuscitated with IVFs/IV calcium and given one dose of broad-spectrum abx to cover for possible sepsis. WBC and lactate normal, Hc at previous baseline, Cr elevated (most likely pre-renal). UA without infection, UDS neg. Discussed case with [facility], who accepted for admission. Updated pt and family, they agree with the plan. Pt remains HDS and NAD.       Diagnosis     Clinical Impression:   1. Other specified hypotension    2. Dehydration    3. AKI (acute kidney injury)        Treatment Plan:   ED Disposition     Admit Admitting Physician: Gean Quint [phone]  Diagnosis: Other  specified hypotension [458.8.ICD-9-CM]  Estimated Length of Stay: > or = to 2 midnights  Tentative Discharge Plan?: Home or Self Care [1]  Patient Class: Inpatient [101]              _______________________________    Attestations: This note is prepared by Fabio Bering acting as scribe for Ardis Hughs, MD, PHD. The scribe's documentation has been prepared under my direction and personally reviewed by me in its entirety.  I confirm that the note above accurately reflects all work, treatment, procedures, and medical decision making performed by me.    _______________________________               Juliane Poot, MD [facility]  06/19/16 [phone]

## 2016-06-19 NOTE — H&P (Signed)
ADMISSION HISTORY AND PHYSICAL EXAM    Date Time: 06/19/2016 10:09 PM  Patient Name: [name]  Attending Physician: Juliane Poot, MD PHD        History of Present Illness:   [name] is a 82 y.o. male with medical history of Dyslipidemia and T2D came to ER due to dizziness when he stand up on the day of admission. His daughter told me that he is not drinking enough fluid but no  history of chest pain , syncopy, vomiting or fever. He has no Limb weakness or diplopia.    On arrival to ER , he was hypotensive but no fever or tachycardia and was saturating well on RA. Hypotension responded with  fluid bolus. Blood work was significant for elevated creatinine but negative troponin and Lactic acid.  Urine not suggestive of UTI. CTH and cxr was normal. Heme occult was negative.  He was admitted for further work up and treatment.  Past Medical History:     Past Medical History   Diagnosis Date   . No diagnosis    . Diabetes mellitus    . Hyperlipidemia    . Cerebrovascular accident      03/2015       Past Surgical History:     Past Surgical History   Procedure Laterality Date   . Appendectomy     . Egd, biopsy N/A 06/30/2015     Procedure: EGD, BIOPSY;  Surgeon: [name], MD;  Location: [facility];  Service: Gastroenterology;  Laterality: N/A;   . Egd, colonoscopy N/A 08/04/2015     Procedure: EGD, COLONOSCOPY;  Surgeon: [name], MD;  Location: [facility];  Service: Gastroenterology;  Laterality: N/A;       Family History:   No family history on file.    Social History:     Social History     Social History   . Marital Status: Single     Spouse Name: N/A   . Number of Children: N/A   . Years of Education: N/A     Social History Main Topics   . Smoking status: Current Some Day Smoker     Types: Cigarettes   . Smokeless tobacco: Not on file      Comment: smokes 2-3 cigaretes wekly   . Alcohol Use: Yes      Comment: occassionally   . Drug Use: No   . Sexual Activity: Not on file     Other Topics  Concern   . Not on file     Social History Narrative       Allergies:     Allergies   Allergen Reactions   . Penicillins        Medications:     (Not in a hospital admission)    Review of Systems:   Non contributory except in HPI    Physical Exam:   BP 111/53 mmHg  Pulse 64  Temp(Src) 97.3 F (36.3 C) (Oral)  Resp 16  Ht 1.753 m (5\' 9" )  Wt 72.122 kg (159 lb)  BMI 23.47 kg/m2  SpO2 100%      General appearance - awake in no distress  Eyes - pupils equal and reactive, extraocular eye movements intact  Ears - bilateral TM's and external ear canals normal  Nose - normal and patent, no erythema, discharge or polyps  Mouth - mucous membranes moist, pharynx normal without lesions  Neck - supple, no significant adenopathy  Lymphatics - no palpable lymphadenopathy  Chest - clear to auscultation, no wheezes, rales or rhonchi, symmetric air entry  Heart - normal rate, regular rhythm, normal S1, S2, no murmurs, rubs, clicks or gallops  Abdomen - soft, nontender, nondistended, no masses or organomegaly  Back exam - full range of motion, no tenderness, palpable spasm or pain on motion  Neurological - alert, oriented, normal speech, no focal findings or movement disorder noted  Musculoskeletal - no joint tenderness, deformity or swelling  Extremities - peripheral pulses normal, no pedal edema, no clubbing or cyanosis  Skin - normal coloration and turgor, no rashes, no suspicious skin lesions noted    Labs:       Recent Labs  Lab 06/19/16  2024   WBC 6.00   HGB 10.4*   HEMATOCRIT 33.3*   PLATELETS 235       Recent Labs  Lab 06/19/16  2024   SODIUM 139   POTASSIUM 4.7   CHLORIDE 106   CO2 23   BUN 22.0   CREATININE 1.7*   CALCIUM 9.0   ALBUMIN 3.3*   PROTEIN, TOTAL 7.0   BILIRUBIN, TOTAL 0.4   ALKALINE PHOSPHATASE 66   ALT 8   AST (SGOT) 18   GLUCOSE 122*       Recent Labs  Lab 06/19/16  2024   TROPONIN I 0.01       Microbiology Results     Procedure Component Value Units Date/Time    Blood culture #1 [[id number]] Collected:   06/19/16 2025    Specimen Information:  Blood from Blood Updated:  06/19/16 2025    Narrative:      1 BLUE+1 PURPLE    Blood culture #2 [[id number]] Collected:  06/19/16 2024    Specimen Information:  Blood from Blood Updated:  06/19/16 2024    Narrative:      1 BLUE+1 PURPLE    Urine culture [[id number]] Collected:  06/19/16 2133    Specimen Information:  Urine from Urine, Clean Catch Updated:  06/19/16 2133          Rads:     Ct Head Without Contrast    06/19/2016   Stable head CT with age-appropriate findings. No acute abnormalities. Note: A combination of automatic exposure control, adjustment of the MA and/or KV according to patient size and/or use of iterative reconstructive technique was utilized. Lorenda Peck, MD 06/19/2016 9:06 PM     Chest Ap Portable    06/19/2016   No radiographic evidence for acute cardiopulmonary disease. Lorenda Peck, MD 06/19/2016 9:01 PM       Assessment :    Dizziness with episode of Hypotension   AKI    Dehydration   Hypotensive   T2D   Dyslipidemia   History of CVA   History of GIB    Plan  :    Admit to medical floor to Dr Graciela Husbands service   Hydration   Monitor RFT   Cardiac enzymes   SSC/ hold oral hypoglycemic agents   GI and DVT prophylaxis      Hershal Coria, MD  [id number]  [facility]  [phone]  06/19/2016  10:09 PM

## 2016-06-19 NOTE — ED Notes (Signed)
[name] is a 82 y.o. male c/o hypotension and dizziness. Started at 1500. Pt reports only taking diabetes medication today.  Denies N/V/D. Reports fever. BP 70/42 mmHg  Pulse 74  Temp(Src) 97.3 F (36.3 C) (Tympanic)  Resp 16  Ht 5\' 9"  (1.753 m)  Wt 72.122 kg  BMI 23.47 kg/m2  SpO2 100%

## 2016-06-20 LAB — HEMOGLOBIN A1C
Average Estimated Glucose: 119.8 mg/dL
Hemoglobin A1C: 5.8 % (ref 4.6–5.9)

## 2016-06-20 LAB — BASIC METABOLIC PANEL
Anion Gap: 7 (ref 5.0–15.0)
BUN: 18 mg/dL (ref 9.0–28.0)
CO2: 19 mEq/L — ABNORMAL LOW (ref 22–29)
Calcium: 8.2 mg/dL (ref 7.9–10.2)
Chloride: 113 mEq/L — ABNORMAL HIGH (ref 100–111)
Creatinine: 1 mg/dL (ref 0.7–1.3)
Glucose: 73 mg/dL (ref 70–100)
Potassium: 4.2 mEq/L (ref 3.5–5.1)
Sodium: 139 mEq/L (ref 136–145)

## 2016-06-20 LAB — GLUCOSE WHOLE BLOOD - POCT
Whole Blood Glucose POCT: 50 mg/dL — CL (ref 70–100)
Whole Blood Glucose POCT: 181 mg/dL — ABNORMAL HIGH (ref 70–100)
Whole Blood Glucose POCT: 182 mg/dL — ABNORMAL HIGH (ref 70–100)
Whole Blood Glucose POCT: 100 mg/dL (ref 70–100)
Whole Blood Glucose POCT: 152 mg/dL — ABNORMAL HIGH (ref 70–100)

## 2016-06-20 LAB — GFR: EGFR: >60

## 2016-06-20 LAB — HEMOLYSIS INDEX: Hemolysis Index: 3 (ref 0–18)

## 2016-06-20 NOTE — UM Notes (Signed)
[facility] DUAL  Admit to Inpatient (Order [id number]) 06/19/16 2140  Final diagnoses      Other specified hypotension     Dehydration     AKI (acute kidney injury)         82 y.o. male with PMH of DM, hyperlipidemia, CVA, c/o dizziness starting 3 hours ago. Pt states that he feels 'like the room is spinning'. Pt also c/o a "dry throat." He states he has not done anything differently today and has been taking his home medications as usual. Pt denies LOC, CP, abdominal pain, vomiting, diarrhea, bloody or black stool, nausea, or positive sick contact. Pt takes medication for DM, but states that his sugar levels have been normal.          97.3 F (36.3 C)  Tympanic  74  100 %  --  16   70/42 mmHg       Past Medical History   Diagnosis Date   . No diagnosis    . Diabetes mellitus    . Hyperlipidemia    . Cerebrovascular accident      03/2015       Color, UA Amber (A)        Clarity, UA Clear      Specific Gravity UA 1.025      Urine pH 5.0      Leukocyte Esterase, UA Negative      Nitrite, UA Negative      Protein, UR 30 (A)      Glucose, UA Negative      Ketones UA Trace (A)      Urobilinogen, UA 2.0 mg/dL      Bilirubin, UA Negative      Blood, UA Negative      RBC, UA 6 - 10 (A) /hpf      WBC, UA 0 - 5 /hpf      Hyaline Casts, UA 11 - 25 (A) /lpf      Urine Mucus Present                     Glucose 122 (H) mg/dL        BUN 40.9 mg/dL      Creatinine 1.7 (H) mg/dL      Sodium 811 mEq/L      Potassium 4.7 mEq/L      Chloride 106 mEq/L      CO2 23 mEq/L      Calcium 9.0 mg/dL      Protein, Total 7.0 g/dL      Albumin 3.3 (L) g/dL      AST (SGOT) 18 U/L      ALT 8 U/L      Alkaline Phosphatase 66 U/L      Bilirubin, Total 0.4 mg/dL      Globulin 3.7 (H) g/dL                     Hgb 10.4 (L) g/dL        Hematocrit 91.4 (L) %      Platelets 235 x10 3/uL      RBC 3.52 (L) x10 6/uL      MCV 94.6 fL                       BLOOD CX PENDING    ER MEDS:      06/19/2016 2025 sodium chloride 0.9 % bolus 2,163 mL 2,163 mL Intravenous New Bag Clarene Critchley,  Romena, RN        06/19/2016 2212 calcium GLUConate 1 g in sodium chloride 0.9 % 100 mL IVPB 1 g Intravenous [address] Rhona [place], RN       06/19/2016 2104 calcium GLUConate 1 g in sodium chloride 0.9 % 100 mL IVPB 1 g Intravenous [address] Barbera Setters, RN       06/19/2016 2216 aztreonam (AZACTAM) 2 g in sodium chloride 0.9 % 100 mL IVPB mini-bag plus 0 g Intravenous Stopped Daraseng, Janeann Forehand, RN       06/19/2016 2141 aztreonam (AZACTAM) 2 g in sodium chloride 0.9 % 100 mL IVPB mini-bag plus 2 g Intravenous [address] Barbera Setters, RN       06/19/2016 2216 vancomycin (VANCOCIN) 1,500 mg in sodium chloride 0.9 % 500 mL IVPB 1,500 mg Intravenous New Bag          ADMIT TO TELE  VS Q 4HR  PT/OT    heparin (porcine) injection 5,000 Units  Dose: 5,000 Units Freq: Every 12 hours scheduled Route: SC  0.9% NaCl infusion  Rate: 100 mL/hr Freq: Continuous Route: IV    Current Facility-Administered Medications   Medication Dose Route Frequency   . aspirin 81 mg Oral Daily   . brimonidine 1 drop Both Eyes Q12H [facility]   . famotidine 10 mg Oral Q12H [facility]    Or   . famotidine 10 mg Intravenous Q12H [facility]   . heparin (porcine) 5,000 Units Subcutaneous Q12H [facility]   . simvastatin 40 mg Oral QHS     . sodium chloride 100 mL/hr (06/19/16 2348)     acetaminophen **OR** acetaminophen, acetaminophen, Nursing communication: Adult Hypoglycemia Treatment Algorithm **AND** dextrose **AND** dextrose **AND** glucagon (rDNA) **AND** Nursing communication: Document Significant Event Note, insulin lispro, insulin lispro, morphine, naloxone, ondansetron             8/20-MD PROG NOTE-  Hospital Day: 1      Assessment:    Symptomatic hypotension-dizziness. Head Ct negative    Episodes of hypoglycemia .   Acute kidney injury.   Severe  dehydration.   Non-anion gap Acidosis.   Anemia-history of gastrointestinal bleed. Heme negative   Diabetes type 2.   Hyperlipidemia.   CVA-history.   Full code.    Plan:      Continue Hydration .   Follow culture - Hold Abx    Follow renal functions.   Discontinue oral hypoglycemics.   Hba1c    Continue sliding scale and blood sugar monitoring.   Follow hematocrit.   Gastrointestinal prophylaxis.   Deep vein thrombosis prophylaxis.   D/c planning when stable           Subjective:   Patient seen and examined :  Dropped blood sugar-off oral agents.  Less Dizziness , no focal neurological symptoms.  CE negative .   Denies chest pain, nausea, vomiting, no shortness of breath.  Renal functions improved.  Hypertension, improved           POCT - Glucose Whole blood 50 (LL) mg/dL Updated: 16/10/96 0454             Homero Fellers, RN, BSN, ACM, CCM  Utilization Review Nurse  [facility]  [phone] [facility] Staff ONLY)  [phone] ([employer])    Please submit all clinical review requests via fax to [phone]

## 2016-06-20 NOTE — Progress Notes (Signed)
PROGRESS NOTE    Date Time: 06/20/2016 1:25 PM  Patient Name: [name], [name]  Patient status: Inpatient  Hospital Day: 1      Assessment:    Symptomatic hypotension-dizziness. Head Ct negative    Episodes of hypoglycemia .   Acute kidney injury.   Severe dehydration.   Non-anion gap  Acidosis.   Anemia-history of gastrointestinal bleed. Heme negative    Diabetes type 2.   Hyperlipidemia.   CVA-history.   Full code.    Plan:      Continue Hydration .   Follow culture - Hold Abx    Follow renal functions.   Discontinue oral hypoglycemics.   Hba1c    Continue sliding scale and blood sugar monitoring.   Follow hematocrit.   Gastrointestinal prophylaxis.   Deep vein thrombosis prophylaxis.   D/c planning when stable     Continue rest of treatment.  Discussed plan with patient and Nurse .   Discussed with Dr.Anbessie .  Labs and data  Reviewed .  Called sister updated / all question answered .  Subjective:   Patient seen and examined :  Dropped blood sugar-off oral agents.  Less Dizziness , no focal neurological symptoms.  CE negative .   Denies chest pain, nausea, vomiting, no shortness of breath.  Renal functions improved.  Hypertension, improved  Review of Systems:   General ROS: no fever no chills.  CVS ROS: no CP, no palpitation, no orthopnea, no PND  RESP ROS: no SOB, no cough, no sputum  GI ROS: no N/V, no diarrhea, no constipation, no melena, no hematochezia  GU ROS: Denies urine symptoms  NEURO ROS: no focal weakness, no headache, no paraesthesia  MSK ROS: General weakness   Medications:     Current Facility-Administered Medications   Medication Dose Route Frequency   . aspirin  81 mg Oral Daily   . brimonidine  1 drop Both Eyes Q12H [facility]   . famotidine  10 mg Oral Q12H [facility]    Or   . famotidine  10 mg Intravenous Q12H [facility]   . heparin (porcine)  5,000 Units Subcutaneous Q12H [facility]   . simvastatin  40 mg Oral QHS     . sodium chloride 100 mL/hr (06/19/16 2348)     acetaminophen **OR**  acetaminophen, acetaminophen, Nursing communication: Adult Hypoglycemia Treatment Algorithm **AND** dextrose **AND** dextrose **AND** glucagon (rDNA) **AND** Nursing communication: Document Significant Event Note, insulin lispro, insulin lispro, morphine, naloxone, ondansetron    Physical Exam:   BP 107/53 mmHg  Pulse 72  Temp(Src) 95.9 F (35.5 C) (Axillary)  Resp 16  Ht 1.753 m (5\' 9" )  Wt 72.576 kg (160 lb)  BMI 23.62 kg/m2  SpO2 98%    Intake and Output Summary (Last 24 hours) at Date Time    Intake/Output Summary (Last 24 hours) at 06/20/16 1325  Last data filed at 06/20/16 1200   Gross per 24 hour   Intake    695 ml   Output   2185 ml   Net  -1490 ml       General: awake, alert, , no acute distress.  Mucosa still dry   Neck : no JVD, no LAD, no carotid bruit, no thyromegaly  ENT: no sinus tenderness, no rhinorrhea, no diplopia, no photophobia  Lungs: clear to auscultation bilaterally, without wheezing, rhonchi, or rales  Cardiovascular: regular rate and rhythm, no murmurs.  Abdomen: soft, non-tender, non-distended;bowel sounds normal  Extremities : no clubbing, cyanosis, or edema  GU: no CVA  tenderness   Neuro: CN II-XII intact, no focal motor deficit, no focal sensory deficit  General weakness   Labs:     CBC w/Diff CMP     Recent Labs  Lab 06/19/16  2024   WBC 6.00   HGB 10.4*   HEMATOCRIT 33.3*   PLATELETS 235   MCV 94.6   NEUTROPHILS 67.5       PT/INR           Recent Labs  Lab 06/20/16  0454 06/19/16  2024   SODIUM 139 139   POTASSIUM 4.2 4.7   CHLORIDE 113* 106   CO2 19* 23   BUN 18.0 22.0   CREATININE 1.0 1.7*   GLUCOSE 73 122*   CALCIUM 8.2 9.0   PROTEIN, TOTAL  --  7.0   ALBUMIN  --  3.3*   AST (SGOT)  --  18   ALT  --  8   ALKALINE PHOSPHATASE  --  66   BILIRUBIN, TOTAL  --  0.4      Glucose POCT     Recent Labs  Lab 06/20/16  0454 06/19/16  2024   GLUCOSE 73 122*          Recent Labs  Lab 06/19/16  2024   TROPONIN I 0.01       Rads:   Ct Head Without Contrast    06/19/2016   Stable head CT  with age-appropriate findings. No acute abnormalities. Note: A combination of automatic exposure control, adjustment of the MA and/or KV according to patient size and/or use of iterative reconstructive technique was utilized. Lorenda Peck, MD 06/19/2016 9:06 PM     Chest Ap Portable    06/19/2016   No radiographic evidence for acute cardiopulmonary disease. Lorenda Peck, MD 06/19/2016 9:01 PM         Marily Memos Thelma Barge, MD  06/20/2016

## 2016-06-20 NOTE — Plan of Care (Signed)
Problem: Pain  Goal: Pain at adequate level as identified by patient  Outcome: Completed Date Met:  06/20/16      RN thoroughly assessed patient's learning abilities.    Patient Status:           Patient has no complaints of pain at this time ( 0/10 ). No evidence of grimace and any other signs of pain nor any kind of discomfort.     Individualized Plan of Care for today:         Continue to monitor pain level and medicate if needed. RN will continue to monitor pain status. Patient educated about about pain management and was asked to seek assistance if necessary. Patient was told that the RN is going to conduct a purposeful hourly rounding through out the entire shift to attend his needs. Hand out for pain management was given to the patient and explained on layman's term. Patient verbalized and demonstrated understanding of today's goals.   All questions and concerns were addressed.        Comments:   Low BS this AM  Carbs given PO  Will recheck after an hour    Teach Back:    Understanding Your Condition:  Question Outcome   Do you know why you were in the hospital?  Completed   Do you know your diagnosis? Completed   What is your understanding of your diagnosis? Completed     Medications:  Question Outcome   Have you received any prescriptions for new medications? Do you know the names? Completed   Do you remember any of the side effects from new or old medications you have been instructed on? Completed   Which medications from your previous home list are you modifying, stopping, or continuing? Completed   Which medications by name were changed? Completed   Do you know the schedule of your medications? Completed

## 2016-06-20 NOTE — Plan of Care (Signed)
Problem: Hemodynamic Status: Cardiac  Goal: Stable vital signs and fluid balance  Outcome: Progressing    06/20/16 0035   Goal/Interventions addressed this shift   Stable vital signs and fluid balance Monitor/assess vital signs and telemetry per unit protocol;Weigh on admission and record weight daily;Assess signs and symptoms associated with cardiac rhythm changes;Monitor lab values;Monitor intake/output per unit protocol and/or LIP order         Comments:   Patient vital sign remains stable. IVF at 100 ml/hr. No adverse reaction from Vancomycin.

## 2016-06-21 LAB — CBC
Absolute NRBC: 0 10*3/uL
Hematocrit: 29.3 % — ABNORMAL LOW (ref 42.0–52.0)
Hgb: 9.1 g/dL — ABNORMAL LOW (ref 13.0–17.0)
MCH: 28.9 pg (ref 28.0–32.0)
MCHC: 31.1 g/dL — ABNORMAL LOW (ref 32.0–36.0)
MCV: 93 fL (ref 80.0–100.0)
MPV: 10.8 fL (ref 9.4–12.3)
Nucleated RBC: 0 /100 WBC (ref 0.0–1.0)
Platelets: 169 10*3/uL (ref 140–400)
RBC: 3.15 10*6/uL — ABNORMAL LOW (ref 4.70–6.00)
RDW: 15 % (ref 12–15)
WBC: 4.94 10*3/uL (ref 3.50–10.80)

## 2016-06-21 LAB — BASIC METABOLIC PANEL
Anion Gap: 6 (ref 5.0–15.0)
BUN: 16 mg/dL (ref 9.0–28.0)
CO2: 22 mEq/L (ref 22–29)
Calcium: 8.4 mg/dL (ref 7.9–10.2)
Chloride: 111 mEq/L (ref 100–111)
Creatinine: 0.9 mg/dL (ref 0.7–1.3)
Glucose: 152 mg/dL — ABNORMAL HIGH (ref 70–100)
Potassium: 4.5 mEq/L (ref 3.5–5.1)
Sodium: 139 mEq/L (ref 136–145)

## 2016-06-21 LAB — GFR: EGFR: >60

## 2016-06-21 LAB — HEMOLYSIS INDEX: Hemolysis Index: 12 (ref 0–18)

## 2016-06-21 LAB — GLUCOSE WHOLE BLOOD - POCT
Whole Blood Glucose POCT: 85 mg/dL (ref 70–100)
Whole Blood Glucose POCT: 182 mg/dL — ABNORMAL HIGH (ref 70–100)

## 2016-06-21 NOTE — Progress Notes (Signed)
06/21/16         06/21/16 1427   Patient Type   Within 30 Days of Previous Admission? No   Healthcare Decisions   Interviewed: Patient   Orientation/Decision Making Abilities of Patient Alert and Oriented x3, able to make decisions   Advance Directive Patient does not have advance directive   Healthcare Agent Appointed No   Prior to admission   Prior level of function Independent with ADLs;Ambulates independently   Type of Residence Private residence   Home Layout One level   Have running water, electricity, heat, etc? Yes   Living Arrangements Alone   How do you get to your MD appointments? self   How do you get your groceries? self   Who fixes your meals? self   Who does your laundry? self   Who picks up your prescriptions? self   Dressing Independent   Grooming Independent   Feeding Independent   Bathing Independent   Toileting Independent   DME Currently at Home Other (Comment)  (none)   Name of Prior Assisted Living Facility n/s   Home Care/Community Services None   Prior SNF admission? (Detail) no   Prior Rehab admission? (Detail) no   Adult Protective Services (APS) involved? No   Discharge Planning   Support Systems Family members   Patient expects to be discharged to: home self-care   Anticipated [place] plan discussed with: Patient   Mode of transportation: Private car (family member)   Consults/Providers   PT Evaluation Needed 1   OT Evalulation Needed 1   SLP Evaluation Needed 2   Correct PCP listed in Epic? Yes   Important Message from [facility] Notice   Patient received 1st IMM Letter? Yes       Garry Heater, RN MSN  Clinical Care Manager  [phone]

## 2016-06-21 NOTE — Plan of Care (Signed)
Problem: Safety  Goal: Patient will be free from injury during hospitalization  Outcome: Completed Date Met:  06/21/16    06/21/16 0800   Goal/Interventions addressed this shift   Patient will be free from injury during hospitalization  Hourly rounding;Use appropriate transfer methods;Provide and maintain safe environment       RN thoroughly assessed patient's learning abilities.    Patient Status:           Patient is at moderate risk for falls. Bed is in the lowest position. Side rails (3/4) was up. Call bell, telephone, table, water and needs are all  within reach. Instructed patient to call for assistance prior to getting out of bed. Environmental check was conducted. Fall mats x2 is in place. Reinforced patient education on how to use the call bell and phone to get assistance during the bedside ISHAPED report handoff. Patient was told that the RN is going to conduct a purposeful hourly rounding through out the entire shift to attend to his needs. Patient agreed to the plan. Patient verbalized and demonstrated understanding. All questions and concerns were addressed.      Individualized Plan of Care for today:        Continue to implement safety precautions, strict intentional hourly rounding and provide assistance when needed.        Comments:   BS normal this AM    Teach Back:    Understanding Your Condition:  Question Outcome   Do you know why you were in the hospital?  Completed   Do you know your diagnosis? Completed   What is your understanding of your diagnosis? Completed     Medications:  Question Outcome   Have you received any prescriptions for new medications? Do you know the names? Completed   Do you remember any of the side effects from new or old medications you have been instructed on? Completed   Which medications from your previous home list are you modifying, stopping, or continuing? Completed   Which medications by name were changed? Completed   Do you know the schedule of your medications?  Completed

## 2016-06-21 NOTE — Discharge Summary -  Nursing (Signed)
Patient is discharged per Dr. Odessa Fleming order. Vital signs were stable and NSR on the telemetry monitor. PIV line was taken out, Telemetry box was discontinued. Patient's personal belongings were gathered and packed.  Patient education was conducted regarding the discharge instructions, new prescriptions and follow up appointments. Patient and family members doesn't have any further questions for the treatment team and they expressed appreciation for the excellent service that they received during this hospitalization.  All questions/concerns addressed.    Patient requested to stay for lunch meal.

## 2016-06-21 NOTE — PT Eval Note (Signed)
[facility]  Physical Therapy Evaluation and Treatment    Patient: [name]     MRN#: [id number]   Unit: [facility]  Bed: A2508/A2508-B    Time of Evaluation and Treatment:  Time Calculation  PT Received On: 06/21/16  Start Time: 0920  Stop Time: 0937  Time Calculation (min): 17 min    Chart Review and Collaboration with Care Team: 5 minutes (not included in time above)    Evaluation Time: 8 minutes  Treatment Time: 9 minutes (1 TA)    PT Visit Number: 1    Consult received for [name] for PT Evaluation and Treatment.  Patient's medical condition is appropriate for Physical therapy intervention at this time.    Activity Orders: None specified. RN ok'd pt to work with PT.    Precautions and Contraindications:   Precautions  Weight Bearing Status: no restrictions    Medical Diagnosis: Other specified hypotension [I95.89]  Dehydration [E86.0]  AKI (acute kidney injury) [N17.9]    History of Present Illness: [name] is a 82 y.o. male admitted on 06/19/2016 due to dizziness when he stand up on the day of admission. On arrival to ER, pt was hypotensive but no fever or tachycardia and was saturating well on RA. CT Head and chest xray-both negative.        Patient Active Problem List   Diagnosis   . Lacunar infarction   . Lightheadedness   . Gastrointestinal hemorrhage with melena   . Symptomatic anemia   . Type 2 diabetes mellitus without complication   . Mixed hyperlipidemia   . Acute blood loss anemia   . Syncope   . Other specified hypotension        Past Medical/Surgical History:  Past Medical History   Diagnosis Date   . No diagnosis    . Diabetes mellitus    . Hyperlipidemia    . Cerebrovascular accident      03/2015      Past Surgical History   Procedure Laterality Date   . Appendectomy     . Egd, biopsy N/A 06/30/2015     Procedure: EGD, BIOPSY;  Surgeon: [name], MD;  Location: [facility];  Service: Gastroenterology;  Laterality: N/A;   . Egd, colonoscopy N/A 08/04/2015      Procedure: EGD, COLONOSCOPY;  Surgeon: [name], MD;  Location: [facility];  Service: Gastroenterology;  Laterality: N/A;         X-Rays/Tests/Labs:  Lab Results   Component Value Date/Time    HGB 9.1* 06/21/2016 01:05 AM    HEMATOCRIT 29.3* 06/21/2016 01:05 AM    POTASSIUM 4.5 06/21/2016 01:05 AM    SODIUM 139 06/21/2016 01:05 AM    TROPONIN I 0.01 06/19/2016 08:24 PM       Social History:  Prior Level of Function  Prior level of function: Independent with ADLs, Ambulates independently  Baseline Activity Level: Community ambulation  DME Currently at Home: Other (Comment) (none)    Home Living Arrangements  Living Arrangements: Alone  Type of Home: Apartment  Home Layout: One level (stated having "a few steps" to access apt)  Bathroom Shower/Tub: Tub/shower unit  DME Currently at Home: Other (Comment) (none)    Subjective: Patient is agreeable to participation in the therapy session. Nursing clears patient for therapy.   Patient Goal: agreed to work with PT  Pain Assessment  Pain Assessment: No/denies pain    Objective:  Observation of Patient/Vital Signs: Sup BP 144/57 and  seated EOB BP 135/53. Pt reported no dizziness during session.    Patient received in bed with telemetry, bed alarm,  and SCDs in place.           Cognitive Status and Neuro Exam:  Cognition/Neuro Status  Arousal/Alertness: Appropriate responses to stimuli  Attention Span: Appears intact  Orientation Level: Oriented X4  Memory: Appears intact  Following Commands: Follows all commands and directions without difficulty  Safety Awareness: minimal verbal instruction  Insights: Fully aware of deficits;Educated in safety awareness  Problem Solving: Able to problem solve independently  Behavior: attentive;calm;cooperative  Motor Planning: intact      Musculoskeletal Examination  Gross ROM  Right Lower Extremity ROM: within functional limits  Left Lower Extremity ROM: within functional limits  Gross Strength  Right Lower Extremity Strength:  within functional limits  Left Lower Extremity Strength: within functional limits       Functional Mobility:  Functional Mobility  Supine to Sit: Modified Independent;HOB raised  Sit to Supine: Modified Independent (HOB raised)  Sit to Stand: Supervision  Stand to Sit: Supervision     Locomotion  Ambulation: Independent (initially supervision)  Ambulation Distance (Feet): 430 Feet  Pattern: within functional limits  Stair Management:  (pt declined practicing stairs. Pt did well with dynamic gait with no LOB and also had no difficulty with stair negotiation at baseline.)     Balance  Balance  Balance: within functional limits    Participation and Activity Tolerance  Participation and Endurance  Participation Effort: good  Endurance: Tolerates < 10 min exercise, no significant change in vital signs  [place] Dyspnea Scale: 0 Dyspnea    Educated the patient to role of physical therapy, plan of care, goals of therapy and safety with mobility and ADLs, discharge instructions, home safety.   Patient left in bed with SCDs and bed alarm in place and call bell and all personal items/needs within reach. RN notified of session outcome.     Assessment: [name] is a 82 y.o. male admitted 06/19/2016.  Pt's functional mobility is impacted by:  decreased activity tolerance.  There are a few comorbidities or other factors that affect plan of care and require modification of task including: has stairs to manage and lives alone.  Standardized tests and exams incorporated into evaluation include AMPAC mobility, balance, ROM  and Strength.  Pt demonstrates a stable clinical presentation. Pt did well with bed mobility, transfers, and ambulation with no AD. At this time, pt has no acute care PT needs and will be discharged from PT services.    Complexity Level Hx and Co  morbidites Examination Clinical Decision Making Clinical Presentation   Low no impact 1-2 elements Limited options Stable     Treatment: Supine to seated  EOB completed with mod I. Sat EOB independently with no report of dizziness with change in body position. BP recorded-see above. Sit to stand transfer completed with supervision with no LOB. Amb 430 ft with supervision that progressed to Independent with no LOB. Returned to supine in bed (declined to sit in bedside chair). Discussed [place] recommendation (no needs)-verbalized understanding. Encouraged pt to sit up in bedside chair throughout the day and ambulate often with RN/Clin tech-verbalized understanding.  Answered any additional questions prior to leaving room.     Plan: Treatment/Interventions: No skilled interventions needed at this time   PT Frequency: one time visit   Risks/Benefits/POC Discussed with Pt/Family: With patient     G codes: yes   Mobility,  Current Status [phone]): CH  Mobility, Goal Status ([id number]): CH       Goals:   Goals  Goal Formulation: With patient  Time for Goal Acheivement: 1 visit  Goals: Select goal  Pt Will Go Supine To Sit: independent, to maximize functional mobility and independence, Partly met  Pt Will Perform Sit to Stand: independent, to maximize functional mobility and independence, Partly met  Pt Will Ambulate: > 200 feet, independent, to maximize functional mobility and independence, Goal met    AM-PAC:yes        PT Basic Mobility Raw Score: 24  CMS 0-100% Score: 0.00%                    Discharge Recommendation: Home with no needs    Ranae Pila, PT, DPT  [phone]  06/21/2016 11:19 AM

## 2016-06-21 NOTE — Plan of Care (Signed)
Problem: Hemodynamic Status: Cardiac  Goal: Stable vital signs and fluid balance  Outcome: Progressing    06/21/16 0055   Goal/Interventions addressed this shift   Stable vital signs and fluid balance Monitor/assess vital signs and telemetry per unit protocol;Assess signs and symptoms associated with cardiac rhythm changes;Monitor intake/output per unit protocol and/or LIP order         Comments:   Patient remains stable. VSS. No c/o of pain or dizziness. Blood glucose and blood pressure stable as well.

## 2016-06-21 NOTE — Plan of Care (Signed)
Problem: Physical Therapy  Goal: By discharge, patient will perform mobility at the patient's highest functional potential. See PT evaluation for goals.  Outcome: Completed Date Met:  06/21/16  Discharge Recommendation: Home with no needs       Is an Occupational Therapy Evaluation Indicated at this time? No, the patient does not require a OT evaluation.    Treatment/Interventions: No skilled interventions needed at this time  PT Frequency: one time visit     Early/Progressive Mobility Protocol Level: Step 7: Ambulate out of Room (with assistance)    (Please See Therapy Evaluation for device and assistance level needed)    Goals:   Goals  Goal Formulation: With patient  Time for Goal Acheivement: 1 visit  Goals: Select goal  Pt Will Go Supine To Sit: independent, to maximize functional mobility and independence, Partly met  Pt Will Perform Sit to Stand: independent, to maximize functional mobility and independence, Partly met  Pt Will Ambulate: > 200 feet, independent, to maximize functional mobility and independence, Goal met

## 2016-06-21 NOTE — Discharge Summary (Signed)
DISCHARGE SUMMARY    Date Time: 06/21/2016 1:10 PM  Patient Name: [name]  Attending Physician: Gean Quint, MD    Date of Admission:   06/19/2016    Date of Discharge:   06/21/2016    Reason for Admission:   Other specified hypotension [I95.89]  Dehydration [E86.0]  AKI (acute kidney injury) [N17.9]    Discharge Diagnosis   Acute kidney injury, resolved  Hypotension, resolved.  Dehydration, resolved after IV hydration.  Anemia.  Diabetes mellitus type 2.  Hyperlipidemia.  History of CVA    Past Medical History:     Past Medical History   Diagnosis Date   . No diagnosis    . Diabetes mellitus    . Hyperlipidemia    . Cerebrovascular accident      03/2015       Past Surgical History:     Past Surgical History   Procedure Laterality Date   . Appendectomy     . Egd, biopsy N/A 06/30/2015     Procedure: EGD, BIOPSY;  Surgeon: [name], MD;  Location: [facility];  Service: Gastroenterology;  Laterality: N/A;   . Egd, colonoscopy N/A 08/04/2015     Procedure: EGD, COLONOSCOPY;  Surgeon: [name], MD;  Location: [facility];  Service: Gastroenterology;  Laterality: N/A;       Family History:   No family history on file.    Social History:     Social History     Social History   . Marital Status: Single     Spouse Name: N/A   . Number of Children: N/A   . Years of Education: N/A     Social History Main Topics   . Smoking status: Current Some Day Smoker     Types: Cigarettes   . Smokeless tobacco: Not on file      Comment: smokes 2-3 cigaretes wekly   . Alcohol Use: Yes      Comment: occassionally   . Drug Use: No   . Sexual Activity: Not on file     Other Topics Concern   . Not on file     Social History Narrative       Allergies:     Allergies   Allergen Reactions   . Penicillins        Code Status:    full code  Vaccination given :     There is no immunization history on file for this patient.  Influenza:   Pneumonia    Discharge Medications:        Medication List      CONTINUE taking these medications           aspirin 81 MG tablet       brimonidine 0.1 % Soln   Commonly known as:  ALPHAGAN P       iron-vitamin C 65-125 MG Tabs   Commonly known as:  VITRON C   Take 1 tablet by mouth 2 (two) times daily.       metFORMIN 1000 MG tablet   Commonly known as:  GLUCOPHAGE   Take 1 tablet (1,000 mg total) by mouth every morning with breakfast.       pantoprazole 40 MG tablet   Commonly known as:  PROTONIX   Take 1 tablet (40 mg total) by mouth 2 (two) times daily.       simvastatin 40 MG tablet   Commonly known as:  ZOCOR   Take 1 tablet (40 mg total)  by mouth nightly.       UNABLE TO FIND         STOP taking these medications          glimepiride 2 MG tablet   Commonly known as:  AMARYL       ondansetron 4 MG disintegrating tablet   Commonly known as:  ZOFRAN-ODT             Consultations:   Treatment Team:   Attending Provider: Gean Quint, MD  Consulting Physician: Gean Quint, MD    Physical Exam:   BP 122/53 mmHg  Pulse 68  Temp(Src) 96.5 F (35.8 C) (Oral)  Resp 16  Ht 1.753 m (5\' 9" )  Wt 74.844 kg (165 lb)  BMI 24.36 kg/m2  SpO2 100%      General: Not in acute distress  HEENT: NC/AT, pupil reacting to light  Neck: supple  Chest: Clear to auscultation  CV: normal S1 and S2  Abdomen: soft, BS+ve, non distended,nontender  Extremities: no edema  Neuro: AAOx3, moving all extremities    Labs:     Recent Labs  Lab 06/21/16  0105 06/20/16  0454 06/19/16  2024   GLUCOSE 152* 73 122*   BUN 16.0 18.0 22.0   CREATININE 0.9 1.0 1.7*   CALCIUM 8.4 8.2 9.0   SODIUM 139 139 139   POTASSIUM 4.5 4.2 4.7   CHLORIDE 111 113* 106   CO2 22 19* 23   ALBUMIN  --   --  3.3*   AST (SGOT)  --   --  18   ALT  --   --  8   BILIRUBIN, TOTAL  --   --  0.4   ALKALINE PHOSPHATASE  --   --  66       Recent Labs  Lab 06/21/16  0105 06/19/16  2024   WBC 4.94 6.00   HGB 9.1* 10.4*   HEMATOCRIT 29.3* 33.3*   MCV 93.0 94.6   MCH 28.9 29.5   MCHC 31.1* 31.2*   PLATELETS 169 235               Microbiology Results     Procedure Component  Value Units Date/Time    Blood culture #1 [[id number]] Collected:  06/19/16 2025    Specimen Information:  Blood from Blood Updated:  06/21/16 0121    Narrative:      ORDER#: [id number]                                    ORDERED BY: Ardis Hughs  SOURCE: Blood Arm                                    COLLECTED:  06/19/16 20:25  ANTIBIOTICS AT COLL.:                                RECEIVED :  06/20/16 00:40  Culture Blood Aerobic and Anaerobic        PRELIM      06/21/16 01:21  06/21/16   No Growth after 1 day/s of incubation.      Blood culture #2 [[id number]] Collected:  06/19/16 2024    Specimen Information:  Blood from Blood Updated:  06/21/16 0121    Narrative:  ORDER#: [id number]                                    ORDERED BY: Ardis Hughs  SOURCE: Blood Arm                                    COLLECTED:  06/19/16 20:24  ANTIBIOTICS AT COLL.:                                RECEIVED :  06/20/16 00:40  Culture Blood Aerobic and Anaerobic        PRELIM      06/21/16 01:21  06/21/16   No Growth after 1 day/s of incubation.      Urine culture [[id number]] Collected:  06/19/16 2133    Specimen Information:  Urine from Urine, Clean Catch Updated:  06/21/16 0239    Narrative:      ORDER#: [id number]                                    ORDERED BY: Ardis Hughs  SOURCE: Urine, Clean Catch                           COLLECTED:  06/19/16 21:33  ANTIBIOTICS AT COLL.:                                RECEIVED :  06/20/16 05:03  Culture Urine                              FINAL       06/21/16 02:39  06/21/16   No growth of >1,000 CFU/ML, No further work                ABGs:  No results found for: Paul Dykes, PHART, PCO2ART, PO2ART, HCO3ART, BEART, O2SATART    Urinalysis    Recent Labs  Lab 06/19/16  2133   URINE TYPE Clean Catch   COLOR, UA Amber*   CLARITY, UA Clear   SPECIFIC GRAVITY UA 1.025   URINE PH 5.0   NITRITE, UA Negative   KETONES UA Trace*   UROBILINOGEN, UA 2.0   BILIRUBIN, UA Negative   BLOOD, UA  Negative   RBC, UA 6 - 10*   WBC, UA 0 - 5       RADIOLOGY :  Ct Head Without Contrast    06/19/2016   Stable head CT with age-appropriate findings. No acute abnormalities. Note: A combination of automatic exposure control, adjustment of the MA and/or KV according to patient size and/or use of iterative reconstructive technique was utilized. Lorenda Peck, MD 06/19/2016 9:06 PM     Chest Ap Portable    06/19/2016   No radiographic evidence for acute cardiopulmonary disease. Lorenda Peck, MD 06/19/2016 9:01 PM       Echo Results     None            Procedures performed:   No orders of the defined types were placed in this encounter.   None  Hospital Course:   See daily progress notes.82 year old gentleman was admitted to the hospital with hypotension, dehydration, acute kidney injury was hydrated with IV fluid.  Patient's symptoms improved.  Her blood pressure also stabilized.  Creatinine normalized.  Patient is discharged home and will follow-up with his primary care physician.  Patient was also ruled out for myocardial infarction.  Patient also has been on oral hypoglycemic agents.  Patient blood.  Blood sugar was drifting at a lower level.  Hence, he was placed only on 1 oral hypoglycemic agent such as metformin and asked to follow up with his primary care physician          Discharge Instructions:   Discharge home    Follow-up with internal medicine in 1 week    [name], MD  [address]  [place] [address]  [phone]    In 1 week        Signed by: Gean Quint MD

## 2016-06-22 LAB — ECG 12-LEAD
Atrial Rate: 71 {beats}/min
P Axis: 47 degrees
P-R Interval: 168 ms
Q-T Interval: 372 ms
QRS Duration: 84 ms
QTC Calculation (Bezet): 404 ms
R Axis: 54 degrees
T Axis: 58 degrees
Ventricular Rate: 71 {beats}/min

## 2016-06-24 LAB — BETA-HYDROXYBUTYRATE

## 2016-07-24 ENCOUNTER — Emergency Department: Payer: Medicare (Managed Care)

## 2016-07-24 ENCOUNTER — Inpatient Hospital Stay
Admission: EM | Admit: 2016-07-24 | Discharge: 2016-07-29 | DRG: 149 | Disposition: A | Discharge status: Home Health | Payer: Medicare (Managed Care) | Attending: Internal Medicine | Admitting: Internal Medicine

## 2016-07-24 ENCOUNTER — Inpatient Hospital Stay: Payer: Medicare (Managed Care) | Admitting: Internal Medicine

## 2016-07-24 DIAGNOSIS — I959 Hypotension, unspecified: Secondary | ICD-10-CM | POA: Diagnosis present

## 2016-07-24 DIAGNOSIS — H5500 Unspecified nystagmus: Secondary | ICD-10-CM | POA: Diagnosis present

## 2016-07-24 DIAGNOSIS — D509 Iron deficiency anemia, unspecified: Secondary | ICD-10-CM | POA: Diagnosis present

## 2016-07-24 DIAGNOSIS — R42 Dizziness and giddiness: Principal | ICD-10-CM | POA: Diagnosis present

## 2016-07-24 DIAGNOSIS — E860 Dehydration: Secondary | ICD-10-CM | POA: Diagnosis present

## 2016-07-24 DIAGNOSIS — N390 Urinary tract infection, site not specified: Secondary | ICD-10-CM | POA: Diagnosis present

## 2016-07-24 DIAGNOSIS — Z7982 Long term (current) use of aspirin: Secondary | ICD-10-CM

## 2016-07-24 DIAGNOSIS — D649 Anemia, unspecified: Secondary | ICD-10-CM

## 2016-07-24 DIAGNOSIS — Z8639 Personal history of other endocrine, nutritional and metabolic disease: Secondary | ICD-10-CM

## 2016-07-24 DIAGNOSIS — Z7984 Long term (current) use of oral hypoglycemic drugs: Secondary | ICD-10-CM

## 2016-07-24 DIAGNOSIS — F17210 Nicotine dependence, cigarettes, uncomplicated: Secondary | ICD-10-CM | POA: Diagnosis present

## 2016-07-24 DIAGNOSIS — E119 Type 2 diabetes mellitus without complications: Secondary | ICD-10-CM | POA: Diagnosis present

## 2016-07-24 DIAGNOSIS — R079 Chest pain, unspecified: Secondary | ICD-10-CM

## 2016-07-24 DIAGNOSIS — N289 Disorder of kidney and ureter, unspecified: Secondary | ICD-10-CM

## 2016-07-24 DIAGNOSIS — Z8673 Personal history of transient ischemic attack (TIA), and cerebral infarction without residual deficits: Secondary | ICD-10-CM

## 2016-07-24 DIAGNOSIS — E785 Hyperlipidemia, unspecified: Secondary | ICD-10-CM | POA: Diagnosis present

## 2016-07-24 LAB — CBC AND DIFFERENTIAL
Absolute NRBC: 0 10*3/uL
Basophils Absolute Automated: 0.02 10*3/uL (ref 0.00–0.20)
Basophils Automated: 0.4 %
Eosinophils Absolute Automated: 0.05 10*3/uL (ref 0.00–0.70)
Eosinophils Automated: 0.9 %
Hematocrit: 31.5 % — ABNORMAL LOW (ref 42.0–52.0)
Hgb: 9.9 g/dL — ABNORMAL LOW (ref 13.0–17.0)
Immature Granulocytes Absolute: 0.01 10*3/uL
Immature Granulocytes: 0.2 %
Lymphocytes Absolute Automated: 0.76 10*3/uL (ref 0.50–4.40)
Lymphocytes Automated: 14.1 %
MCH: 29.3 pg (ref 28.0–32.0)
MCHC: 31.4 g/dL — ABNORMAL LOW (ref 32.0–36.0)
MCV: 93.2 fL (ref 80.0–100.0)
MPV: 10.6 fL (ref 9.4–12.3)
Monocytes Absolute Automated: 0.33 10*3/uL (ref 0.00–1.20)
Monocytes: 6.1 %
Neutrophils Absolute: 4.21 10*3/uL (ref 1.80–8.10)
Neutrophils: 78.3 %
Nucleated RBC: 0 /100 WBC (ref 0.0–1.0)
Platelets: 206 10*3/uL (ref 140–400)
RBC: 3.38 10*6/uL — ABNORMAL LOW (ref 4.70–6.00)
RDW: 16 % — ABNORMAL HIGH (ref 12–15)
WBC: 5.38 10*3/uL (ref 3.50–10.80)

## 2016-07-24 LAB — BASIC METABOLIC PANEL
Anion Gap: 9 (ref 5.0–15.0)
BUN: 19 mg/dL (ref 9.0–28.0)
CO2: 23 mEq/L (ref 22–29)
Calcium: 8.8 mg/dL (ref 7.9–10.2)
Chloride: 106 mEq/L (ref 100–111)
Creatinine: 1.6 mg/dL — ABNORMAL HIGH (ref 0.7–1.3)
Glucose: 121 mg/dL — ABNORMAL HIGH (ref 70–100)
Potassium: 5 mEq/L (ref 3.5–5.1)
Sodium: 138 mEq/L (ref 136–145)

## 2016-07-24 LAB — GLUCOSE WHOLE BLOOD - POCT: Whole Blood Glucose POCT: 129 mg/dL — ABNORMAL HIGH (ref 70–100)

## 2016-07-24 LAB — URINALYSIS, REFLEX TO MICROSCOPIC EXAM IF INDICATED
Bilirubin, UA: NEGATIVE
Blood, UA: NEGATIVE
Glucose, UA: NEGATIVE
Leukocyte Esterase, UA: NEGATIVE
Nitrite, UA: NEGATIVE
Protein, UR: 100 — AB
Specific Gravity UA: 1.023 (ref 1.001–1.035)
Urine pH: 5 (ref 5.0–8.0)
Urobilinogen, UA: NEGATIVE mg/dL

## 2016-07-24 LAB — GFR: EGFR: 50.3

## 2016-07-24 LAB — TROPONIN I
Troponin I: <0.01 ng/mL (ref 0.00–0.09)
Troponin I: <0.01 ng/mL (ref 0.00–0.09)

## 2016-07-24 LAB — LACTIC ACID, PLASMA: Lactic Acid: 2 mmol/L (ref 0.2–2.0)

## 2016-07-24 LAB — HEMOLYSIS INDEX: Hemolysis Index: 24 — ABNORMAL HIGH (ref 0–18)

## 2016-07-24 MED ORDER — NALOXONE HCL 0.4 MG/ML IJ SOLN (WRAP)
0.2000 mg | INTRAMUSCULAR | Status: DC | PRN
Start: 2016-07-24 — End: 2016-07-29

## 2016-07-24 MED ORDER — ASPIRIN 81 MG PO TABS
81.0000 mg | ORAL_TABLET | Freq: Every day | ORAL | Status: DC
Start: 2016-07-25 — End: 2016-07-24

## 2016-07-24 MED ORDER — ASPIRIN 325 MG PO TABS
324.0000 mg | ORAL_TABLET | Freq: Every day | ORAL | Status: DC
Start: 2016-07-25 — End: 2016-07-29
  Administered 2016-07-25 – 2016-07-28 (×4): 324 mg via ORAL
  Filled 2016-07-24 (×5): qty 1

## 2016-07-24 MED ORDER — BRIMONIDINE TARTRATE 0.2 % OP SOLN
1.0000 [drp] | Freq: Two times a day (BID) | OPHTHALMIC | Status: DC
Start: 2016-07-24 — End: 2016-07-29
  Administered 2016-07-24 – 2016-07-28 (×9): 1 [drp] via OPHTHALMIC
  Filled 2016-07-24: qty 5

## 2016-07-24 MED ORDER — ACETAMINOPHEN 325 MG PO TABS
650.0000 mg | ORAL_TABLET | Freq: Four times a day (QID) | ORAL | Status: AC
Start: 2016-07-24 — End: 2016-07-25
  Administered 2016-07-24 – 2016-07-25 (×3): 650 mg via ORAL
  Filled 2016-07-24 (×3): qty 2

## 2016-07-24 MED ORDER — PANTOPRAZOLE SODIUM 40 MG PO TBEC
40.0000 mg | DELAYED_RELEASE_TABLET | Freq: Every day | ORAL | Status: DC
Start: 2016-07-25 — End: 2016-07-29
  Administered 2016-07-25 – 2016-07-28 (×4): 40 mg via ORAL
  Filled 2016-07-24 (×5): qty 1

## 2016-07-24 MED ORDER — SIMVASTATIN 40 MG PO TABS
40.0000 mg | ORAL_TABLET | Freq: Every evening | ORAL | Status: DC
Start: 2016-07-24 — End: 2016-07-29
  Administered 2016-07-24 – 2016-07-28 (×5): 40 mg via ORAL
  Filled 2016-07-24 (×5): qty 1

## 2016-07-24 MED ORDER — ONDANSETRON HCL 4 MG/2ML IJ SOLN
4.0000 mg | Freq: Three times a day (TID) | INTRAMUSCULAR | Status: AC | PRN
Start: 2016-07-24 — End: 2016-07-25

## 2016-07-24 MED ORDER — HEPARIN SODIUM (PORCINE) 5000 UNIT/ML IJ SOLN
5000.0000 [IU] | Freq: Two times a day (BID) | INTRAMUSCULAR | Status: DC
Start: 2016-07-24 — End: 2016-07-29
  Administered 2016-07-24 – 2016-07-28 (×9): 5000 [IU] via SUBCUTANEOUS
  Filled 2016-07-24 (×9): qty 1

## 2016-07-24 MED ORDER — SODIUM CHLORIDE 0.9 % IV BOLUS
500.0000 mL | Freq: Once | INTRAVENOUS | Status: AC
Start: 2016-07-24 — End: 2016-07-24
  Administered 2016-07-24: 500 mL via INTRAVENOUS

## 2016-07-24 NOTE — ED Triage Notes (Signed)
According to ems pt walked from his home to a restaurant and outside the restaurant he called 911 for dizziness. Ems did ekg said it was borderline, bp 92/50. Hx htn, heart attack and type 2 diabetes.

## 2016-07-24 NOTE — H&P (Signed)
ADMISSION HISTORY AND PHYSICAL EXAM    Date Time: 07/24/16 9:43 PM  Patient Name: [name]  Attending Physician: Gean Quint, MD        History of Present Illness:   [name] is a 82 y.o. male with a history of type 2 diabetes mellitus and dyslipidemia, old CVA in 2016 with no residual and tobacco user came to emergency room because of sudden onset of dizziness.  He noted to have nausea and some sort of chest pain along with his dizziness while he was walking which was relieved on sitting.  He had similar type of admission back on August of this year which was responded with fluid bolus and discharged improved.  He denied a history of diplopia, facial palsy or limb weakness.  He denied a history of vomiting or diarrhea.  No history of melena, hematochezia or hematemesis    On arrival to emergency room, the patient was found to be hypotensive.  His total blood pressure of 90, which responded to fluid bolus to 118/58.  Blood work was significant for anemia with hematocrit of 31 and elevated creatinine at 1.6.  Troponin was negative.  Lactic acid was negative.  Urine is concentrated with no overt symptoms of urinary tract infection.  Chest x-ray was nonrevealing.  EKG reveals sinus rhythm at the rate of 70/m with no change compared from previous EKG patient was admitted for further workup and treatment    Past Medical History:     Past Medical History:   Diagnosis Date   . Cerebrovascular accident     03/2015   . Diabetes mellitus    . Hyperlipidemia    . No diagnosis        Past Surgical History:     Past Surgical History:   Procedure Laterality Date   . APPENDECTOMY     . EGD, BIOPSY N/A 06/30/2015    Procedure: EGD, BIOPSY;  Surgeon: [name], MD;  Location: [facility];  Service: Gastroenterology;  Laterality: N/A;   . EGD, COLONOSCOPY N/A 08/04/2015    Procedure: EGD, COLONOSCOPY;  Surgeon: [name], MD;  Location: [facility];  Service: Gastroenterology;  Laterality: N/A;       Family  History:   No family history on file.    Social History:     Social History     Social History   . Marital status: Single     Spouse name: N/A   . Number of children: N/A   . Years of education: N/A     Social History Main Topics   . Smoking status: Current Some Day Smoker     Packs/day: 0.50     Types: Cigarettes   . Smokeless tobacco: Current User      Comment: smokes 2-3 cigaretes wekly   . Alcohol use Yes      Comment: occassionally   . Drug use: No   . Sexual activity: Not on file     Other Topics Concern   . Not on file     Social History Narrative   . No narrative on file       Allergies:     Allergies   Allergen Reactions   . Penicillins        Medications:     Prescriptions Prior to Admission   Medication Sig   . aspirin 81 MG tablet Take 81 mg by mouth daily.   . brimonidine (ALPHAGAN P) 0.1 % Solution Place 1 drop into  both eyes 2 (two) times daily.      . iron-vitamin C (VITRON C) 65-125 MG Tab Take 1 tablet by mouth 2 (two) times daily.   . metFORMIN (GLUCOPHAGE) 1000 MG tablet Take 1 tablet (1,000 mg total) by mouth every morning with breakfast.   . pantoprazole (PROTONIX) 40 MG tablet Take 1 tablet (40 mg total) by mouth 2 (two) times daily.   . simvastatin (ZOCOR) 40 MG tablet Take 1 tablet (40 mg total) by mouth nightly.   Marland Kitchen UNABLE TO FIND Take 45 mg by mouth daily.Acton 45 mg       Review of Systems:    Noncontributory except mentioned in history of present illness    Physical Exam:   BP 142/55   Pulse 76   Temp (!) 95.5 F (35.3 C) (Oral)   Resp 20   Wt 70.1 kg (154 lb 9.6 oz)   SpO2 100%   BMI 22.83 kg/m       General appearance - Awake in no distress without dizziness  Eyes - pupils equal and reactive, extraocular eye movements intact  Ears - bilateral TM's and external ear canals normal  Nose - normal and patent, no erythema, discharge or polyps  Mouth - mucous membranes moist, pharynx normal without lesions  Neck - supple, no significant adenopathy  Lymphatics - no palpable  lymphadenopathy  Chest - clear to auscultation, no wheezes, rales or rhonchi, symmetric air entry  Heart - normal rate, regular rhythm, normal S1, S2, no murmurs, rubs, clicks or gallops  Abdomen - soft, nontender, nondistended, no masses or organomegaly  Back exam - full range of motion, no tenderness, palpable spasm or pain on motion  Neurological - no focal neurological deficit Musculoskeletal - no joint tenderness, deformity or swelling  Extremities - peripheral pulses normal, no pedal edema, no clubbing or cyanosis  Skin - normal coloration and turgor, no rashes, no suspicious skin lesions noted    Labs:       Recent Labs  Lab 07/24/16  1830   WBC 5.38   Hgb 9.9*   Hematocrit 31.5*   Platelets 206       Recent Labs  Lab 07/24/16  1830   Sodium 138   Potassium 5.0   Chloride 106   CO2 23   BUN 19.0   Creatinine 1.6*   Calcium 8.8   Glucose 121*       Recent Labs  Lab 07/24/16  1830   Troponin I <0.01       Microbiology Results     Procedure Component Value Units Date/Time    Blood Culture Aerobic/Anaerobic #1 [[id number]] Collected:  07/24/16 1830    Specimen:  Arm from Blood Updated:  07/24/16 1830    Narrative:       1 BLUE+1 PURPLE    Blood Culture Aerobic/Anaerobic #2 [[id number]] Collected:  07/24/16 1830    Specimen:  Arm from Blood Updated:  07/24/16 1830    Narrative:       1 BLUE+1 PURPLE          Rads:     Xr Chest  Ap Portable    Result Date: 07/24/2016   No acute cardiopulmonary findings. 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   Dehydration   AK I   Anemia   History of gastrointestinal bleeding   Type 2 diabetes mellitus  Dyslipidemia   Tobacco user    Plan  :      Admit to telemetry bed   Hydration   Neuro checks every shift   Cardiac enzymes   Monitor renal function test and H and H   Stool Hemoccult   Sliding scale coverage to control blood sugar   Aspirin   Statin   MRI of the brain to exclude brainstem ischemia or  infarct   Gastrointestinal and deep vein thrombosis prophylaxis   subsequent recommendation to follow    Hershal Coria, MD  [id number]  [facility]  [phone]  07/24/2016  9:43 PM

## 2016-07-24 NOTE — Progress Notes (Signed)
Admitted patient, an 82 year old pleasant gentleman @ 2100H from ED via stretcher with a chief complaints of Dizziness. Purposeful hourly rounding and plan of care discussed with the patient.

## 2016-07-24 NOTE — ED Notes (Signed)
Bed: GR12  Expected date: 07/24/16  Expected time: 5:10 PM  Means of arrival: [id number]  Comments:  [facility]

## 2016-07-24 NOTE — ED Provider Notes (Addendum)
EMERGENCY DEPARTMENT HISTORY AND PHYSICAL EXAM     Physician/Midlevel provider first contact with patient: 07/24/16 1749         Date: 07/24/2016  Patient Name: [name]    History of Presenting Illness     Chief Complaint   Patient presents with   . Dizziness       History Provided By: Patient    Chief Complaint: Lightheadedness  Onset: Today, 1PM  Timing: Constant  Location: Neurological  Quality: "feels like i'm going to pass out"  Severity: Moderate  Exacerbating factors: None  Alleviating factors: None  Associated Symptoms: CP, SOB, blurred vision, nausea  Pertinent Negatives: No cough, vomiting, diarrhea, dysuria, double vision, or numbness/tingling or weakness in extremities    Additional History: [name] is a 82 y.o. male w/ hx of DM, HLD and CVA, p/w constant lightheadedness x 1PM today. He was eating lunch at a restaurant during Sx onset. Denies exacerbating or alleviating factors. He feels like he's "going to pass out." He also c/o mild constant chest discomfort since 1p, that has now resolved, and SOB that is worse w/ talking. Pt also reports some blurry vision and nausea. Denies recent head trauma, cough, vomiting, diarrhea, dysuria, double vision, or numbness/tingling or weakness in extremities. Pt lives by himself.      PCP: [name], MD  SPECIALISTS:    Current Facility-Administered Medications   Medication Dose Route Frequency Provider Last Rate Last Dose   . acetaminophen (TYLENOL) tablet 650 mg  650 mg Oral Q6H Annett Fabian, MD   650 mg at 07/24/16 2253   . [START ON 07/25/2016] aspirin tablet 324 mg  324 mg Oral Daily Anbessie, Tedla B, MD       . brimonidine (ALPHAGAN) 0.2 % ophthalmic solution 1 drop  1 drop Both Eyes BID Anbessie, Tedla B, MD   1 drop at 07/24/16 2253   . heparin (porcine) injection 5,000 Units  5,000 Units Subcutaneous Q12H [facility] Anbessie, Tedla B, MD   5,000 Units at 07/24/16 2253   . naloxone [facility]) injection 0.2 mg  0.2 mg Intravenous PRN Anbessie, Tedla  B, MD       . ondansetron (ZOFRAN) injection 4 mg  4 mg Intravenous Q8H PRN Annett Fabian, MD       . Melene Muller ON 07/25/2016] pantoprazole (PROTONIX) EC tablet 40 mg  40 mg Oral Daily Anbessie, Tedla B, MD       . simvastatin (ZOCOR) tablet 40 mg  40 mg Oral QHS Anbessie, Tedla B, MD   40 mg at 07/24/16 2253       Past History     Past Medical History:  Past Medical History:   Diagnosis Date   . Cerebrovascular accident     03/2015   . Diabetes mellitus    . Hyperlipidemia    . No diagnosis        Past Surgical History:  Past Surgical History:   Procedure Laterality Date   . APPENDECTOMY     . EGD, BIOPSY N/A 06/30/2015    Procedure: EGD, BIOPSY;  Surgeon: [name], MD;  Location: [facility];  Service: Gastroenterology;  Laterality: N/A;   . EGD, COLONOSCOPY N/A 08/04/2015    Procedure: EGD, COLONOSCOPY;  Surgeon: [name], MD;  Location: [facility];  Service: Gastroenterology;  Laterality: N/A;       Family History:  noncontributory    Social History:  Social History   Substance Use Topics   .  Smoking status: Current Some Day Smoker     Packs/day: 0.50     Types: Cigarettes   . Smokeless tobacco: Current User      Comment: smokes 2-3 cigaretes wekly   . Alcohol use Yes      Comment: occassionally       Allergies:  Allergies   Allergen Reactions   . Penicillins        Review of Systems     Review of Systems   Constitutional: Negative for diaphoresis and fever.   HENT: Negative for drooling, hearing loss and sore throat.    Eyes: Positive for visual disturbance. Negative for discharge and redness.        + "Blurry vision"   Respiratory: Positive for shortness of breath. Negative for cough.    Cardiovascular: Positive for chest pain. Negative for leg swelling.   Gastrointestinal: Positive for nausea. Negative for abdominal pain, diarrhea and vomiting.   Genitourinary: Negative for difficulty urinating and dysuria.   Musculoskeletal: Negative for back pain and gait problem.   Skin: Negative for rash.    Allergic/Immunologic:        Allergic to Penicillins   Neurological: Positive for light-headedness. Negative for syncope, weakness and numbness.   Psychiatric/Behavioral: Negative for self-injury and suicidal ideas.         Physical Exam   BP 118/56   Pulse 71   Temp (!) 96.3 F (35.7 C) (Oral)   Resp 20   Wt 70.1 kg   SpO2 99%   BMI 22.83 kg/m     Physical Exam   Constitutional: He is oriented to person, place, and time. He appears well-developed and well-nourished. No distress.   HENT:   Head: Normocephalic and atraumatic.   Mouth/Throat: Oropharynx is clear and moist.   Eyes: Conjunctivae are normal. Right eye exhibits no discharge. Left eye exhibits no discharge. No scleral icterus.   R eye cornea cloudy, pt reports as old problem    L pupil sluggishly reactive to light, 2mm   Neck: No JVD present. No tracheal deviation present.   Cardiovascular: Normal rate, regular rhythm, normal heart sounds and intact distal pulses.    No murmur heard.  2+ L radial pulse     Pulmonary/Chest: Effort normal and breath sounds normal. No stridor. No respiratory distress. He has no wheezes.   Abdominal: Soft. He exhibits no distension and no mass. There is no tenderness. There is no rebound and no guarding.   Musculoskeletal: Normal range of motion. He exhibits no edema or tenderness.   Neurological: He is alert and oriented to person, place, and time. He exhibits normal muscle tone. Coordination normal.   MAES   Skin: Skin is warm and dry. No rash noted. He is not diaphoretic.   Psychiatric: He has a normal mood and affect. Judgment normal.   Nursing note and vitals reviewed.        Diagnostic Study Results     Labs -     Results     Procedure Component Value Units Date/Time    Troponin I [[id number]] Collected:  07/24/16 2258    Specimen:  Blood Updated:  07/24/16 2326     Troponin I <0.01 ng/mL     Lipid panel [[id number]] Collected:  07/24/16 2258    Specimen:  Blood Updated:  07/24/16 2258    Narrative:       Fasting  specimen    Ferritin [[id number]] Collected:  07/24/16 1830    Specimen:  Blood  Updated:  07/24/16 2215    IRON PROFILE [[id number]] Collected:  07/24/16 1830     Updated:  07/24/16 2215    Lactic acid [[id number]] Collected:  07/24/16 2011    Specimen:  Blood Updated:  07/24/16 2011     Lactic acid 2.0 mmol/L     UA, Reflex to Microscopic (pts 3 + yrs) [[id number]]  (Abnormal) Collected:  07/24/16 1829    Specimen:  Urine Updated:  07/24/16 1946     Urine Type Clean Catch     Color, UA Amber (A)     Clarity, UA Sl Cloudy (A)     Specific Gravity UA 1.023     Urine pH 5.0     Leukocyte Esterase, UA Negative     Nitrite, UA Negative     Protein, UR 100 (A)     Glucose, UA Negative     Ketones UA Trace (A)     Urobilinogen, UA Negative mg/dL      Bilirubin, UA Negative     Blood, UA Negative     RBC, UA 6 - 10 (A) /hpf      WBC, UA 6 - 10 (A) /hpf      Squamous Epithelial Cells, Urine 0 - 5 /hpf      Hyaline Casts, UA 26 - 50 (A) /lpf      Urine Mucus Present    Troponin I [[id number]] Collected:  07/24/16 1830    Specimen:  Blood Updated:  07/24/16 1923     Troponin I <0.01 ng/mL     Basic Metabolic Panel [[id number]]  (Abnormal) Collected:  07/24/16 1830    Specimen:  Blood Updated:  07/24/16 1915     Glucose 121 (H) mg/dL      BUN 24.4 mg/dL      Creatinine 1.6 (H) mg/dL      Calcium 8.8 mg/dL      Sodium 010 mEq/L      Potassium 5.0 mEq/L      Chloride 106 mEq/L      CO2 23 mEq/L      Anion Gap 9.0    Hemolysis index [[id number]]  (Abnormal) Collected:  07/24/16 1830     Updated:  07/24/16 1915     Hemolysis Index 24 (H)    GFR [[id number]] Collected:  07/24/16 1830     Updated:  07/24/16 1915     EGFR 50.3    CBC with differential [[id number]]  (Abnormal) Collected:  07/24/16 1830    Specimen:  Blood from Blood Updated:  07/24/16 1842     WBC 5.38 x10 3/uL      Hgb 9.9 (L) g/dL      Hematocrit 29.5 (L) %      Platelets 206 x10 3/uL      RBC 3.38 (L) x10 6/uL      MCV 93.2 fL      MCH 29.3 pg      MCHC 31.4 (L) g/dL       RDW 16 (H) %      MPV 10.6 fL      Neutrophils 78.3 %      Lymphocytes Automated 14.1 %      Monocytes 6.1 %      Eosinophils Automated 0.9 %      Basophils Automated 0.4 %      Immature Granulocyte 0.2 %      Nucleated RBC 0.0 /100 WBC      Neutrophils Absolute 4.21 x10 3/uL  Abs Lymph Automated 0.76 x10 3/uL      Abs Mono Automated 0.33 x10 3/uL      Abs Eos Automated 0.05 x10 3/uL      Absolute Baso Automated 0.02 x10 3/uL      Absolute Immature Granulocyte 0.01 x10 3/uL      Absolute NRBC 0.00 x10 3/uL     Blood Culture Aerobic/Anaerobic #1 [[id number]] Collected:  07/24/16 1830    Specimen:  Arm from Blood Updated:  07/24/16 1830    Narrative:       1 BLUE+1 PURPLE    Blood Culture Aerobic/Anaerobic #2 [[id number]] Collected:  07/24/16 1830    Specimen:  Arm from Blood Updated:  07/24/16 1830    Narrative:       1 BLUE+1 PURPLE    Glucose Whole Blood - POCT [[id number]]  (Abnormal) Collected:  07/24/16 1738     Updated:  07/24/16 1741     POCT - Glucose Whole blood 129 (H) mg/dL           Radiologic Studies -   Radiology Results (24 Hour)     Procedure Component Value Units Date/Time    XR Chest  AP Portable [[id number]] Collected:  07/24/16 1841    Order Status:  Completed Updated:  07/24/16 1845    Narrative:       Frontal chest x-ray    Indication: Syncope    Comparison: 06/19/2016 x-ray    Findings: Cardio mediastinal silhouette is stable. There are chronic  interstitial changes similar to the prior exam. No pneumonia or pleural  effusion.      Impression:        No acute cardiopulmonary findings.    Lorinda Creed, MD   07/24/2016 6:41 PM        .    Medical Decision Making   I am the first provider for this patient.    I reviewed the vital signs, available nursing notes, past medical history, past surgical history, family history and social history.    Vital Signs-Reviewed the patient's vital signs.     Patient Vitals for the past 12 hrs:   BP Temp Pulse Resp   07/24/16 2305 118/56 (!) 96.3 F (35.7 C) 71 20    07/24/16 2101 142/55 (!) 95.5 F (35.3 C) 76 20   07/24/16 2018 118/58 - 72 18   07/24/16 1920 111/54 97.8 F (36.6 C) 72 18   07/24/16 1730 90/54 98.1 F (36.7 C) 72 20       Pulse Oximetry Analysis - Normal 100% on RA    Cardiac Monitor:  Rate: 72  Rhythm:  Normal Sinus Rhythm     EKG:  Interpreted by the EP.   Time Interpreted: 5:53p   Rate: 70   Rhythm: Normal Sinus Rhythm    Interpretation: LAE, TWI V1, elevated J point V1-6, I   Comparison: 06/19/16 similar      Admit Decision Time:  The decision to admit this patient was made by the emergency provider at 6:30PM on 07/24/2016     Clinical Decision Support:       Old Medical Records: Old medical records.     Admitted on 8/19 and discharged on 8/21 for hypotension and acute kidney injury. Resolved w/ IV meds. BP 122/53 on day of discharge. Creatanine as high as 1.7.    ED Course:     6:00 PM - Discussed plan for UA, CXR, and blood work. Pt agrees w/ plan and has  dizziness, nausea, and SOB in ED, but no CP.     Creat similar to when he was admitted, but Hgb less than on 8/19. Pt not orthostatic based on V/S, but standing BP still low after IVF, not at baseline BP as when D/C on 8/21. Pt lives by himself, he has CAD & CVA risk factors. Will need admission    8:26 PM - Discussed w/ Dr. Griffin Dakin ([facility]) who accepts pt on behalf of Dr. Graciela Husbands to inpatient, [facility].    8:30 PM - Updated pt on plan for hospitalization. He agrees w/ plan    Provider Notes: 1p while eating, felt lightheaded like he was going to pass out. Constant. Also w/ constant chest discomfort. CP resolved. SOB worse w/ talking. + nausea. No other Sx. I asked him if Sx were similar to when he was admitted last month, states he wasn't admitted last month. Was hypotensive then as well. Labs, r/o infection, r/o ACS, reassess    CRITICAL CARE: The high probability of sudden, clinically significant deterioration in the patient's condition required the highest level of my  preparedness to intervene urgently.    The services I provided to this patient were to treat and/or prevent clinically significant deterioration that could result in: death.  Services included the following: chart data review, reviewing nursing notes and/or old charts, documentation time, consultant collaboration regarding findings and treatment options, medication orders and management, direct patient care, re-evaluations, vital sign assessments and ordering, interpreting and reviewing diagnostic studies/lab tests.    Aggregate critical care time was 41 minutes, which includes only time during which I was engaged in work directly related to the patient's care, as described above, whether at the bedside or elsewhere in the Emergency Department.  It did not include time spent performing other reported procedures or the services of residents, students, nurses or physician assistants.          Diagnosis     Clinical Impression:   1. Lightheadedness    2. Normocytic anemia    3. Renal insufficiency    4. Chest pain in adult    5. Hypotension, unspecified    6. H/O: CVA (cerebrovascular accident)    7. H/O hyperlipidemia        Treatment Plan:   ED Disposition     ED Disposition Condition Date/Time Comment    Admit  Sat Jul 24, 2016  8:27 PM Admitting Physician: Gean Quint [[id number]]   Diagnosis: Lightheadedness [865784]   Estimated Length of Stay: > or = to 2 midnights   Tentative Discharge Plan?: Home or Self Care [1]   Patient Class: Inpatient [101]              _______________________________      Attestations: This note is prepared by Rosalio Macadamia, acting as scribe for Carles Collet, MD.    Carles Collet, MD - The scribe's documentation has been prepared under my direction and personally reviewed by me in its entirety.  I confirm that the note above accurately reflects all work, treatment, procedures, and medical decision making performed by me.    _______________________________     Annett Fabian, MD  07/25/16 1048        Annett Fabian, MD  07/25/16 1050

## 2016-07-25 ENCOUNTER — Inpatient Hospital Stay: Payer: Medicare (Managed Care)

## 2016-07-25 LAB — LIPID PANEL
Cholesterol / HDL Ratio: 3.2
Cholesterol: 129 mg/dL (ref 0–199)
HDL: 40 mg/dL (ref 40–9999)
LDL Calculated: 80 mg/dL (ref 0–99)
Triglycerides: 47 mg/dL (ref 34–149)
VLDL Calculated: 9 mg/dL — ABNORMAL LOW (ref 10–40)

## 2016-07-25 LAB — BASIC METABOLIC PANEL
Anion Gap: 5 (ref 5.0–15.0)
BUN: 21 mg/dL (ref 9.0–28.0)
CO2: 25 mEq/L (ref 22–29)
Calcium: 8.8 mg/dL (ref 7.9–10.2)
Chloride: 108 mEq/L (ref 100–111)
Creatinine: 1.2 mg/dL (ref 0.7–1.3)
Glucose: 141 mg/dL — ABNORMAL HIGH (ref 70–100)
Potassium: 4 mEq/L (ref 3.5–5.1)
Sodium: 138 mEq/L (ref 136–145)

## 2016-07-25 LAB — CBC
Absolute NRBC: 0 10*3/uL
Hematocrit: 28.5 % — ABNORMAL LOW (ref 42.0–52.0)
Hgb: 9 g/dL — ABNORMAL LOW (ref 13.0–17.0)
MCH: 29 pg (ref 28.0–32.0)
MCHC: 31.6 g/dL — ABNORMAL LOW (ref 32.0–36.0)
MCV: 91.9 fL (ref 80.0–100.0)
MPV: 10.2 fL (ref 9.4–12.3)
Nucleated RBC: 0 /100 WBC (ref 0.0–1.0)
Platelets: 189 10*3/uL (ref 140–400)
RBC: 3.1 10*6/uL — ABNORMAL LOW (ref 4.70–6.00)
RDW: 16 % — ABNORMAL HIGH (ref 12–15)
WBC: 4.66 10*3/uL (ref 3.50–10.80)

## 2016-07-25 LAB — IRON PROFILE
Iron Saturation: 10 % — ABNORMAL LOW (ref 15–50)
Iron: 38 ug/dL — ABNORMAL LOW (ref 40–160)
TIBC: 371 ug/dL (ref 261–462)
UIBC: 333 ug/dL (ref 126–382)

## 2016-07-25 LAB — HEMOLYSIS INDEX
Hemolysis Index: 26 — ABNORMAL HIGH (ref 0–18)
Hemolysis Index: 23 — ABNORMAL HIGH (ref 0–18)
Hemolysis Index: 47 — ABNORMAL HIGH (ref 0–18)
Hemolysis Index: 8 (ref 0–18)

## 2016-07-25 LAB — GLUCOSE WHOLE BLOOD - POCT
Whole Blood Glucose POCT: 119 mg/dL — ABNORMAL HIGH (ref 70–100)
Whole Blood Glucose POCT: 55 mg/dL — ABNORMAL LOW (ref 70–100)
Whole Blood Glucose POCT: 182 mg/dL — ABNORMAL HIGH (ref 70–100)
Whole Blood Glucose POCT: 114 mg/dL — ABNORMAL HIGH (ref 70–100)
Whole Blood Glucose POCT: 235 mg/dL — ABNORMAL HIGH (ref 70–100)

## 2016-07-25 LAB — GFR: EGFR: >60

## 2016-07-25 LAB — FERRITIN: Ferritin: 8.34 ng/mL — ABNORMAL LOW (ref 21.80–274.70)

## 2016-07-25 LAB — TROPONIN I: Troponin I: <0.01 ng/mL (ref 0.00–0.09)

## 2016-07-25 MED ORDER — GADOBUTROL 1 MMOL/ML IV SOLN
10.0000 mL | Freq: Once | INTRAVENOUS | Status: AC | PRN
Start: 2016-07-25 — End: 2016-07-25
  Administered 2016-07-25: 10 mmol via INTRAVENOUS

## 2016-07-25 MED ORDER — GLUCAGON 1 MG IJ SOLR (WRAP)
1.0000 mg | INTRAMUSCULAR | Status: DC | PRN
Start: 2016-07-25 — End: 2016-07-29

## 2016-07-25 MED ORDER — GADOBUTROL 1 MMOL/ML IV SOLN
10.0000 mL | Freq: Once | INTRAVENOUS | Status: DC | PRN
Start: 2016-07-25 — End: 2016-07-29

## 2016-07-25 MED ORDER — INSULIN LISPRO 100 UNIT/ML SC SOLN
1.0000 [IU] | Freq: Every evening | SUBCUTANEOUS | Status: DC | PRN
Start: 2016-07-25 — End: 2016-07-29
  Administered 2016-07-25 – 2016-07-26 (×2): 1 [IU] via SUBCUTANEOUS
  Filled 2016-07-25 (×3): qty 3

## 2016-07-25 MED ORDER — METFORMIN HCL 500 MG PO TABS
1000.0000 mg | ORAL_TABLET | Freq: Every day | ORAL | Status: DC
Start: 2016-07-27 — End: 2016-07-26

## 2016-07-25 MED ORDER — GLUCOSE 40 % PO GEL
15.0000 g | ORAL | Status: DC | PRN
Start: 2016-07-25 — End: 2016-07-29

## 2016-07-25 MED ORDER — INSULIN LISPRO 100 UNIT/ML SC SOLN
1.0000 [IU] | Freq: Three times a day (TID) | SUBCUTANEOUS | Status: DC | PRN
Start: 2016-07-25 — End: 2016-07-29
  Administered 2016-07-26: 1 [IU] via SUBCUTANEOUS

## 2016-07-25 MED ORDER — DEXTROSE 10 % IV BOLUS
125.0000 mL | INTRAVENOUS | Status: DC | PRN
Start: 2016-07-25 — End: 2016-07-29

## 2016-07-25 NOTE — Plan of Care (Signed)
Problem: Safety  Goal: Patient will be free from injury during hospitalization  Outcome: Progressing   07/25/16 0246   Goal/Interventions addressed this shift   Patient will be free from injury during hospitalization  Assess patient's risk for falls and implement fall prevention plan of care per policy;Provide and maintain safe environment;Use appropriate transfer methods;Ensure appropriate safety devices are available at the bedside;Include patient/ family/ care giver in decisions related to safety;Hourly rounding;Provide alternative method of communication if needed (communication boards, writing);Assess for patients risk for elopement and implement Elopement Risk Plan per policy   Safety protocol maintained, purposeful  Rounding, bed alarm in place.    Problem: Neurological Deficit  Goal: Neurological status is stable or improving  Outcome: Progressing   07/25/16 0246   Goal/Interventions addressed this shift   Neurological status is stable or improving Monitor/assess/document neurological assessment (Stroke: every 4 hours);Monitor/assess NIH Stroke Scale;Re-assess NIH Stroke Scale for any change in status;Observe for seizure activity and initiate seizure precautions if indicated;Perform CAM Assessment   New admit due to Dizziness,  Improved. No neuro deficit noted.

## 2016-07-25 NOTE — UM Notes (Signed)
[facility]  Admit to Inpatient (Order [id number]) 07/24/16 2027    Final diagnoses   Normocytic anemia   Renal insufficiency   Lightheadedness   Chest pain in adult   Hypotension, unspecified         82 y.o. male w/ hx of DM, HLD and CVA, p/w constant lightheadedness x 1PM today. He was eating lunch at a restaurant during Sx onset. Denies exacerbating or alleviating factors. He feels like he's "going to pass out." He also c/o mild constant chest discomfort since 1p, that has now resolved, and SOB that is worse w/ talking. Pt also reports some blurry vision and nausea. Denies recent head trauma, cough, vomiting, diarrhea, dysuria, double vision, or numbness/tingling or weakness in extremities.       95.5 F (35.3 C) Oral 76 100 % -- 20 90/54       Past Medical History:   Diagnosis Date   . Cerebrovascular accident     03/2015   . Diabetes mellitus    . Hyperlipidemia    . No diagnosis        Color, UA Amber (A)       Clarity, UA Sl Cloudy (A)     Specific Gravity UA 1.023     Urine pH 5.0     Leukocyte Esterase, UA Negative     Nitrite, UA Negative     Protein, UR 100 (A)     Glucose, UA Negative     Ketones UA Trace (A)     Urobilinogen, UA Negative mg/dL      Bilirubin, UA Negative     Blood, UA Negative     RBC, UA 6 - 10 (A) /hpf      WBC, UA 6 - 10 (A) /hpf      Squamous Epithelial Cells, Urine 0 - 5 /hpf      Hyaline Casts, UA 26 - 50 (A) /lpf      Urine Mucus Present      Glucose 121 (H) mg/dL       BUN 40.9 mg/dL      Creatinine 1.6 (H) mg/dL      Calcium 8.8 mg/dL      Sodium 811 mEq/L      Potassium 5.0 mEq/L      Chloride 106 mEq/L      CO2 23 mEq/L      Anion Gap 9.0    Hemolysis index [[id number]]  (Abnormal) Collected:  07/24/16 1830     Updated:  07/24/16 1915     Hemolysis Index 24 (H)     Hgb 9.9 (L) g/dL        Hematocrit 21.3 (L) %      Platelets 206 x10 3/uL      RBC 3.38 (L) x10 6/uL      MCV  93.2 fL      MCH 29.3 pg      MCHC 31.4 (L) g/dL      RDW 16 (H) %        Blood cx pending        ER MEDS:   07/24/2016 1832 sodium chloride 0.9 % bolus 500 mL 500 mL Intravenous           ADMIT TO TELE    NEURO CONSULT    Current Facility-Administered Medications   Medication Dose Route Frequency   . acetaminophen  650 mg Oral Q6H   . aspirin  324 mg Oral Daily   .  brimonidine  1 drop Both Eyes BID   . heparin (porcine)  5,000 Units Subcutaneous Q12H [facility]   . pantoprazole  40 mg Oral Daily   . simvastatin  40 mg Oral QHS         9/24-MD PROG NOTE-  Assessment/Plan:     1. Dizziness:  Mostly orthostatic in nature, continue neuro checks, Imaging studies were ordered (MRI/MRA), neurology was consulted.    2. 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    3. Dehydration:  IVFs    4. AKI:  Most probably prerenal secondary to dehydration, resolved, resume Metformin    5. Anemia.    6. History of gastrointestinal bleeding.    7. Type 2 diabetes mellitus:  Fairly controlled, continue current regimen and add accu checks with Insulin Lispro ss coverage.    8. Dyslipidemia:  Continue Statin.    Reason for continued hospitalization:             Dizziness R/O TIA/CVA              D/C Plan:            On stabilization of the pt's condition and clearance by the neurologist.      Recent Labs  Lab 07/25/16  0325   WBC 4.66   Hgb 9.0*   Hematocrit 28.5*   Platelets 189       Recent Labs  Lab 07/25/16  0325   Sodium 138   Potassium 4.0   Chloride 108   CO2 25   BUN 21.0   Creatinine 1.2   EGFR >60.0   Glucose 141*         Homero Fellers, RN, BSN, ACM, CCM  Utilization Review Nurse  [facility]  [phone] [facility] Staff ONLY)  [phone] (Carriers)    Please submit all clinical review requests via fax to [phone]

## 2016-07-25 NOTE — Plan of Care (Signed)
Problem: Pain  Goal: Pain at adequate level as identified by patient  Outcome: Progressing  Pt a/o x3,able to verbalized needs well,vswnl,afebrile, no distress, c/o gen pain 6/10,medicated as ordered,continue regimen and monitor for any changes.

## 2016-07-25 NOTE — Progress Notes (Signed)
MRI Brain W WO contrast, screening not completed, not signed by the patient and this Clinical research associate, Charity fundraiser. Computer no signal. Try again in the morning.

## 2016-07-25 NOTE — PT Eval Note (Addendum)
[facility]  Physical Therapy Evaluation and Treatment    Patient: [name]     MRN#: [id number]   Unit: [place] [facility] [facility] INTERMEDIATE CARE  Bed: J4782/N5621-H    Time of Evaluation and Treatment:  Time Calculation  PT Received On: 07/25/16  Start Time: 1124  Stop Time: 1145  Time Calculation (min): 21 min    Chart Review and Collaboration with Care Team: 5 minutes  Evaluation Time: 12 minutes  Treatment Time: 10 minutes     PT Visit Number: 1    Consult received for [name] for PT Evaluation and Treatment.  Patient's medical condition is appropriate for Physical therapy intervention at this time.    Activity Orders: PT eval and treat    Precautions and Contraindications:   Precautions  Weight Bearing Status: no restrictions  Other Precautions:  (Diziness)    Medical Diagnosis: Hypotension, unspecified [I95.9]  Lightheadedness [R42]  Normocytic anemia [D64.9]  Renal insufficiency [N28.9]  H/O hyperlipidemia [Z86.39]  H/O: CVA (cerebrovascular accident) [Z86.73]  Chest pain in adult [R07.9]    History of Present Illness: [name] is a 82 y.o. male admitted on 07/24/2016 with    diabetes mellitus and dyslipidemia, old CVA in 2016 with no residual and tobacco user came to emergency room because of sudden onset of dizziness.  He noted to have nausea and some sort of chest pain along with his dizziness while he was walking which was relieved on sitting.  He had similar type of admission back on August of this year which was responded with fluid bolus and discharged improved.  He denied a history of diplopia, facial palsy or limb weakness.  He denied a history of vomiting or diarrhea.  No history of melena, hematochezia or hematemesis  Patient Active Problem List   Diagnosis   . Lacunar infarction   . Lightheadedness   . Gastrointestinal hemorrhage with melena   . Symptomatic anemia   . Type 2 diabetes mellitus without complication   . Mixed hyperlipidemia   . Acute blood loss anemia   . Syncope    . Other specified hypotension        Past Medical/Surgical History:  Past Medical History:   Diagnosis Date   . Cerebrovascular accident     03/2015   . Diabetes mellitus    . Hyperlipidemia    . No diagnosis       Past Surgical History:   Procedure Laterality Date   . APPENDECTOMY     . EGD, BIOPSY N/A 06/30/2015    Procedure: EGD, BIOPSY;  Surgeon: [name], MD;  Location: [facility];  Service: Gastroenterology;  Laterality: N/A;   . EGD, COLONOSCOPY N/A 08/04/2015    Procedure: EGD, COLONOSCOPY;  Surgeon: [name], MD;  Location: [facility];  Service: Gastroenterology;  Laterality: N/A;         X-Rays/Tests/Labs:  Lab Results   Component Value Date/Time    HGB 9.0 (L) 07/25/2016 03:25 AM    HCT 28.5 (L) 07/25/2016 03:25 AM    HCT 25.2 08/05/2015 11:39 AM    K 4.0 07/25/2016 03:25 AM    NA 138 07/25/2016 03:25 AM    TROPI <0.01 07/25/2016 03:25 AM    TROPI <0.01 07/24/2016 10:58 PM    TROPI <0.01 07/24/2016 06:30 PM   MRI Brain W WO Contrast       IMPRESSION:    Mild chronic small vessel ischemic changes. No acute  intracranial findings.    Social  History:  Prior Level of Function  Prior level of function: Independent with ADLs, Ambulates independently  Baseline Activity Level: Community ambulation  Driving: independent    Home Living Arrangements  Living Arrangements: Alone  Type of Home: Apartment  Home Layout: One level    Subjective: Patient is agreeable to participation in the therapy session. Nursing clears patient for therapy.   Patient Goal:  ("go home.")  Pain Assessment  Pain Assessment: No/denies pain    Objective:  Observation of Patient/Vital Signs Blood Pressure/Vitals Assessment with Activity   Supine Sit Stand Ambulation   HR (bpm) NT 76 84 NT   BP (mmHg) NT 116/58 110/56 NT   SpO2 NT 97% 98% NT         Patient received in bed with telemetry  and intravenous (IV) line in place.           Cognitive Status and Neuro Exam:  Cognition/Neuro Status  Arousal/Alertness: Appropriate responses to  stimuli  Attention Span: Appears intact  Orientation Level: Oriented X4  Memory: Appears intact  Following Commands: Follows all commands and directions without difficulty  Safety Awareness: independent  Insights: Fully aware of deficits  Problem Solving: Able to problem solve independently  Behavior: calm;cooperative      Musculoskeletal Examination  Gross ROM  Right Upper Extremity ROM: within functional limits  Left Upper Extremity ROM: within functional limits  Right Lower Extremity ROM: within functional limits  Left Lower Extremity ROM: within functional limits  Gross Strength  Right Upper Extremity Strength: within functional limits  Left Upper Extremity Strength: within functional limits  Right Lower Extremity Strength: within functional limits  Left Lower Extremity Strength: within functional limits       Functional Mobility:  Functional Mobility  Rolling: Independent  Supine to Sit: Independent  Scooting to HOB: Independent  Scooting to EOB: Independent  Sit to Supine: Independent  Sit to Stand: Supervision  Stand to Sit: Supervision  Transfers  Bed to Chair: Supervision  Locomotion  Ambulation:  (40 ft with supervision without AD)  Pattern:  (dec cadence)     Balance  Balance  Standing - Dynamic:  (Good -)    Participation and Activity Tolerance  Participation and Endurance  Participation Effort: good  Endurance: Tolerates < 10 min exercise with changes in vital signs    Educated the patient to role of physical therapy, plan of care, goals of therapy and safety with mobility and ADLs, discharge instructions, home safety.   Patient left in bed with in place and call bell and all personal items/needs within reach. RN notified of session outcome.     Assessment: [name] is a 82 y.o. male admitted 07/24/2016.  Pt's functional mobility is impacted by:  dizziness/vertigo and gait impairment.  There are a few comorbidities or other factors that affect plan of care and require modification of task  including: vertigo/dizziness, home alone for a portion of the day and lives alone, h/o CVA, syncope, DM .  Standardized tests and exams incorporated into evaluation include AMPAC mobility, balance, ROM  and Strength.  Pt demonstrates a evolving clinical presentation due to precautions that need clarification.   Pt would continue to benefit from PT to address these deficits and increase functional independence. Highly recommend supervision when [place] home 2/2 dizziness and fall risk.     Complexity Level Hx and Co  morbidites Examination Clinical Decision Making Clinical Presentation   Moderate   1-2 factors 3 or more   Several options Evolving,  plan may alter     Treatment: Pt completed bed mobility with Indpendent. Pt completed transfers supervision . Pt completed functional mobility with 40 ft without AD supervision with dec hip/knee flex,dec step length, dec cadence.  Standing tolerance x 2 min with noted dizziness. Pt educated on use of call pt verbalizes understanding.          Plan: Treatment/Interventions: Exercise, Gait training, Functional transfer training, Endurance training   PT Frequency: 3-4x/wk   Risks/Benefits/POC Discussed with Pt/Family: With patient     G codes: no  Mobility, Current Status (U2725): CJ  Mobility, Goal Status (D6644): CH       Goals:   Goals  Goal Formulation: With patient  Time for Goal Acheivement: 3 visits  Pt Will Transfer Bed/Chair: independent, to maximize functional mobility and independence  Pt Will Ambulate: > 200 feet, independent, to maximize functional mobility and independence  Pt Will Perform Home Exer Program: independent, to maximize functional mobility and independence    AM-PAC:yes        PT Basic Mobility Raw Score: 22  CMS 0-100% Score: 20.91%                 DME Recommended for Discharge:  (likely none )  Discharge Recommendation: Home with supervision, Home with home health PT (Pt will need supervision when up and moving 2/2 diziness )    Dois Davenport, PT, MSPT    [phone]  07/25/2016 2:19 PM

## 2016-07-25 NOTE — Plan of Care (Signed)
Pt is back from MRI per wheelchair,tolerated well,coninute POC,

## 2016-07-25 NOTE — Consults (Signed)
NEUROLOGY CONSULTATION    Date Time: 07/25/16 1:13 PM  Patient Name: [name]  Attending Physician: Gean Quint, MD      Assessment & Plan:   Dizziness - exam is highly suggestive of orthostasis, despite somewhat unusual complaints which don't correlate with this.     Check orthostatics including pulses   Await official MRI brain reading   Will follow    History of Present Illness:   82 yo male with hx CVA, DM, HLD who is reporting 'dizziness' which mainly was occurring yesterday - but has improved but persists today.  Pt has various descriptions of his 'dizziness' and even with family interpreting, it doesn't appear consistent.  At one point he endorses spinning. Certainly he endorses eventual nausea.  But also says he has double and even "seeing 4" of items.  He says he is especially symptomatic sitting and standing.  He denies focal weakness or numbness.    Past Medical History:     Past Medical History:   Diagnosis Date   . Cerebrovascular accident     03/2015   . Diabetes mellitus    . Hyperlipidemia    . No diagnosis        Meds:      Scheduled Meds: PRN Meds:      acetaminophen 650 mg Oral Q6H   aspirin 324 mg Oral Daily   brimonidine 1 drop Both Eyes BID   heparin (porcine) 5,000 Units Subcutaneous Q12H [facility]   pantoprazole 40 mg Oral Daily   simvastatin 40 mg Oral QHS       Continuous Infusions:      gadobutrol 10 mL ONCE PRN   naloxone 0.2 mg PRN   ondansetron 4 mg Q8H PRN         I personally reviewed all of the medications    Allergies   Allergen Reactions   . Penicillins        Social & Family History:     Social History     Social History   . Marital status: Single     Spouse name: N/A   . Number of children: N/A   . Years of education: N/A     Occupational History   . Not on file.     Social History Main Topics   . Smoking status: Current Some Day Smoker     Packs/day: 0.50     Types: Cigarettes   . Smokeless tobacco: Current User      Comment: smokes 2-3 cigaretes wekly   . Alcohol use Yes       Comment: occassionally   . Drug use: No   . Sexual activity: Not on file     Other Topics Concern   . Not on file     Social History Narrative   . No narrative on file     No family history on file.    Review of Systems:   No headache, eye, ear nose, throat problems; no coughing or wheezing or shortness of breath, No chest pain or orthopnea, no abdominal pain, nausea or vomiting, No pain in the body or extremities, no psychiatric, neurological, endocrine, hematological or cardiac complaints except as noted above.     Physical Exam:   Blood pressure 116/59, pulse 77, temperature 97.1 F (36.2 C), temperature source Oral, resp. rate 18, height 1.676 m (5\' 6" ), weight 70.7 kg (155 lb 14.4 oz), SpO2 98 %.    HEENT: Normocephalic. Non-icter, no congestion, no carotid bruits  Optho:  unable to visualize.  R eye with opaque lens  Lungs:  CTA bil  Cardiac:  S1,S2, normal rate and rhythm  Neck: supple, no lymphadenopathy, no thyromegaly, no JVD, no cartoid bruits  Extremities: no clubbing, cyanosis, or edema  Skin: no rashes or lesions noted    Neuro:  Level of consciousness:  Alert and appropriate  Oriented:  X 3  Cognition:  Intact naming, recognition, concentration and following complex commands  Cranial Nerves:  II-XII intact  Strength:  No upper extremity drift, 5/5 strength x 4 extremities  Coordination:  Intact FTN testing  Reflexes:  +2 throughout, down going toes bil  Sensation: Intact x 4 extremities to LT, temp, vibration  Gait:  Pt cannot walk because he gets dizzy just standing with me  Olivia Mackie - no real symptoms with head down on either side.  + symptoms upon sitting up  ++ symptoms upon standing - needs to sit down    Labs:     Recent Labs  Lab 07/25/16  0325 07/24/16  1830   Glucose 141* 121*   BUN 21.0 19.0   Creatinine 1.2 1.6*   Calcium 8.8 8.8   Sodium 138 138   Potassium 4.0 5.0   Chloride 108 106   CO2 25 23       Recent Labs  Lab 07/25/16  0325 07/24/16  1830   WBC 4.66 5.38   Hgb 9.0* 9.9*    Hematocrit 28.5* 31.5*   MCV 91.9 93.2   MCH 29.0 29.3   MCHC 31.6* 31.4*   Platelets 189 206         No results for input(s): PTT, PT, INR in the last 72 hours.       Radiology Results (24 Hour)     Procedure Component Value Units Date/Time    MRI Angiogram Neck W WO Contrast [[id number]] Updated:  07/25/16 1117    Order Status:  Sent     MRI Angiogram Head WO Contrast [[id number]] Updated:  07/25/16 1112    Order Status:  Sent     MRI Brain W WO Contrast [[id number]] Updated:  07/25/16 1108    Order Status:  Sent     XR Chest  AP Portable [[id number]] Collected:  07/24/16 1841    Order Status:  Completed Updated:  07/24/16 1845    Narrative:       Frontal chest x-ray    Indication: Syncope    Comparison: 06/19/2016 x-ray    Findings: Cardio mediastinal silhouette is stable. There are chronic  interstitial changes similar to the prior exam. No pneumonia or pleural  effusion.      Impression:        No acute cardiopulmonary findings.    Lorinda Creed, MD   07/24/2016 6:41 PM             All recent brain and spine imaging (MRI, CT) personally reviewed.    Chart reviewed    Case discussed with: patient and family    40 minutes; >50% time spent in counseling or coordination of care    Signed by: Ardelle Anton, MD  Spectralink: [phone]       Answering Service: [phone]

## 2016-07-25 NOTE — Plan of Care (Signed)
Problem: Compromised Hemodynamic Status  Goal: Vital signs and fluid balance maintained/improved  Outcome: Progressing   07/25/16 2025   Goal/Interventions addressed this shift   Vital signs and fluid balance are maintained/improved Position patient for maximum circulation/cardiac output;Monitor/assess vitals and hemodynamic parameters with position changes;Monitor and compare daily weight;Monitor intake and output. Notify LIP if urine output is less than 30 mL/hour.;Monitor/assess lab values and report abnormal values

## 2016-07-25 NOTE — Progress Notes (Signed)
PROGRESS NOTE      Date Time: 07/25/16 9:02 AM  Patient Name: [name], [name]  Patient Status: Inpatient  Hospital Day:1    Assessment/Plan:     1. Dizziness:  Mostly orthostatic in nature, continue neuro checks, Imaging studies were ordered (MRI/MRA), neurology was consulted.    2. 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    3. Dehydration:  IVFs    4. AKI:  Most probably prerenal secondary to dehydration, resolved, resume Metformin    5. Anemia.    6. History of gastrointestinal bleeding.    7. Type 2 diabetes mellitus:  Fairly controlled, continue current regimen and add accu checks with Insulin Lispro ss coverage.    8. Dyslipidemia:  Continue Statin.    9. Tobacco user  Unfractionated Heparin.              10. DVT prophylaxis              11. Fall precautions.              12. PT/OT/SLP              13. Daily labs.              Reason for continued hospitalization:             Dizziness R/O TIA/CVA              D/C Plan:            On stabilization of the pt's condition and clearance by the neurologist.    Subjective:      No new complaint, no significant medical events overnight, IH was reviewed.    Medications:     Current Facility-Administered Medications   Medication Dose Route Frequency   . acetaminophen  650 mg Oral Q6H   . aspirin  324 mg Oral Daily   . brimonidine  1 drop Both Eyes BID   . heparin (porcine)  5,000 Units Subcutaneous Q12H [facility]   . pantoprazole  40 mg Oral Daily   . simvastatin  40 mg Oral QHS       Review of Systems:    General ROS: negative, no weight loss/gain, no fever, no chills, no rigor   ENT ROS: negative   Endocrine ROS: negative, no fatigue, no polydipsia   Respiratory ROS: no cough, shortness of breath, or wheezing   Cardiovascular ROS: no chest pain or dyspnea on exertion   Gastrointestinal ROS: no abdominal pain, change in bowel habits, or black or bloody stools   Genito-Urinary ROS: no dysuria, trouble voiding, or hematuria   Musculoskeletal ROS: negative   Neurological  ROS:  no encephalopathy, right lower extremity weakness.   Dermatological ROS: negative, no rash, no ulcer    Physical Exam:   BP 125/58   Pulse 78   Temp (!) 96.3 F (35.7 C) (Oral)   Resp 18   Ht 1.676 m (5\' 6" )   Wt 70.7 kg (155 lb 14.4 oz)   SpO2 97%   BMI 25.16 kg/m       Intake/Output Summary (Last 24 hours) at 07/25/16 0902  Last data filed at 07/25/16 0600   Gross per 24 hour   Intake                0 ml   Output              500 ml   Net             -  500 ml       General appearance - alert, well appearing, and in no distress and oriented to person, place, and time  Mental status - alert, oriented to person, place, and time  Eyes - pupils equal and reactive, extraocular eye movements intact  Nose - normal and patent, no erythema, discharge or polyps  Mouth - mucous membranes moist, pharynx normal without lesions  Neck - Supple, no JVD, no thyromegaly  Lymphatics - no palpable lymphadenopathy  Chest - clear to auscultation, no wheezes, rales or rhonchi, symmetric air entry  Heart - normal rate, regular rhythm, normal S1, S2, no murmurs, rubs, clicks or gallops  Abdomen - soft, nontender, nondistended, no masses or organomegaly, bowel sounds normal  Neurological - alert, oriented, normal speech, no focal findings or movement disorder noted  Extremities - peripheral pulses normal, no pedal edema, no clubbing or cyanosis  Skin - normal coloration and turgor, no rashes    Labs:     Recent Labs  Lab 07/25/16  0325   WBC 4.66   Hgb 9.0*   Hematocrit 28.5*   Platelets 189       Recent Labs  Lab 07/25/16  0325   Sodium 138   Potassium 4.0   Chloride 108   CO2 25   BUN 21.0   Creatinine 1.2   EGFR >60.0   Glucose 141*   Calcium 8.8          Recent Labs  Lab 07/25/16  0325   Sodium 138   Potassium 4.0   Chloride 108   CO2 25   BUN 21.0   Creatinine 1.2   Calcium 8.8   Glucose 141*         Rads:   Radiological Procedure reviewed.    Denzil Magnuson, MD  07/25/2016  9:02 AM

## 2016-07-25 NOTE — Plan of Care (Signed)
Problem: Physical Therapy  Goal: By discharge, patient will perform mobility at the  patient's highest functional potential. See PT evaluation for goals.  Outcome: Progressing  Discharge Recommendation: Home with supervision, Home with home health PT (Pt will need supervision when up and moving 2/2 diziness )  DME Recommended for Discharge:  (likely none )    Is an Occupational Therapy Evaluation Indicated at this time? No, the patient does not require a OT evaluation.    Treatment/Interventions: Exercise, Gait training, Functional transfer training, Endurance training  PT Frequency: 3-4x/wk     Early/Progressive Mobility Protocol Level: Step 6: Ambulate in Room (with assistance)  Updated on Patient's Flip Chart in Room  (Please See Therapy Evaluation for device and assistance level needed)    Goals:   Goals  Goal Formulation: With patient  Time for Goal Acheivement: 3 visits  Pt Will Transfer Bed/Chair: independent, to maximize functional mobility and independence  Pt Will Ambulate: > 200 feet, independent, to maximize functional mobility and independence  Pt Will Perform Home Exer Program: independent, to maximize functional mobility and independence

## 2016-07-25 NOTE — Plan of Care (Signed)
Problem: Hemodynamic Status: Cardiac  Goal: Stable vital signs and fluid balance  Outcome: Progressing      Comments: Pt remains a/o x3,vswnl,afebrile, no distress, SR on monitor ,denies pain continue current regimen,monitor for any changes.

## 2016-07-26 LAB — GLUCOSE WHOLE BLOOD - POCT
Whole Blood Glucose POCT: 87 mg/dL (ref 70–100)
Whole Blood Glucose POCT: 188 mg/dL — ABNORMAL HIGH (ref 70–100)
Whole Blood Glucose POCT: 109 mg/dL — ABNORMAL HIGH (ref 70–100)
Whole Blood Glucose POCT: 217 mg/dL — ABNORMAL HIGH (ref 70–100)

## 2016-07-26 LAB — STOOL OCCULT BLOOD: Stool Occult Blood: NEGATIVE

## 2016-07-26 MED ORDER — MECLIZINE HCL 12.5 MG PO TABS
12.5000 mg | ORAL_TABLET | Freq: Three times a day (TID) | ORAL | Status: AC
Start: 2016-07-26 — End: 2016-07-28
  Administered 2016-07-26 – 2016-07-28 (×6): 12.5 mg via ORAL
  Filled 2016-07-26 (×6): qty 1

## 2016-07-26 MED ORDER — OXYCODONE-ACETAMINOPHEN 5-325 MG PO TABS
2.0000 | ORAL_TABLET | ORAL | Status: DC | PRN
Start: 2016-07-26 — End: 2016-07-29
  Administered 2016-07-26: 2 via ORAL
  Filled 2016-07-26: qty 2

## 2016-07-26 MED ORDER — SODIUM CHLORIDE 0.9 % IV SOLN
INTRAVENOUS | Status: DC
Start: 2016-07-26 — End: 2016-07-29

## 2016-07-26 MED ORDER — IRON SUCROSE 20 MG/ML IV SOLN
100.0000 mg | INTRAVENOUS | Status: DC
Start: 2016-07-26 — End: 2016-07-29
  Administered 2016-07-26 – 2016-07-28 (×3): 100 mg via INTRAVENOUS
  Filled 2016-07-26 (×4): qty 5

## 2016-07-26 MED ORDER — CIPROFLOXACIN HCL 250 MG PO TABS
250.0000 mg | ORAL_TABLET | Freq: Two times a day (BID) | ORAL | Status: DC
Start: 2016-07-26 — End: 2016-07-29
  Administered 2016-07-26 – 2016-07-28 (×6): 250 mg via ORAL
  Filled 2016-07-26 (×8): qty 1

## 2016-07-26 MED ORDER — SODIUM CHLORIDE 0.9 % IV SOLN
INTRAVENOUS | Status: DC
Start: 2016-07-26 — End: 2016-07-26

## 2016-07-26 NOTE — Progress Notes (Signed)
MEDICINE PROGRESS NOTE                                                 Gean Quint MD, [place], CMD                                                             [phone]    Date Time: 07/26/16 1:08 PM  Patient Name: [name],[name]  Patient status: Inpatient  Patient hospital day: 2    Pt seen/examined by dr Urbano Heir NP concurrently, plan d/w MD in detail  Assessment:   Dizziness, suspect vertigo (+nystagmus), MRI brain, MRA neck normal, not orthostatic, MI ruled out  UTI  Iron deficiency anemia  DM2, A1c 5.8% in august, does have some hypoglycemic episodes blood sugars to 70s,  HLP  History of CVA  Plan:   Will treat for vertigo with antivert every 8 hours for 2 days scheduled   Appreciate neuro input  PT for vestibular exercises  guiac stool  IV iron while in hospital  cipro po for UTI  Hold metformin for now, use sliding scale prn  DVT prophylaxis, heparin  GI  Prophylaxis protonix  D/w nurse and pt  Reason cont hosp: dizziness, vertigo  Subjective:   C/o dizziness not related to positioning, no chest pain,     Medications:     Current Facility-Administered Medications   Medication Dose Route Frequency   . aspirin  324 mg Oral Daily   . brimonidine  1 drop Both Eyes BID   . ciprofloxacin  250 mg Oral Q12H [facility]   . heparin (porcine)  5,000 Units Subcutaneous Q12H [facility]   . iron sucrose  100 mg Intravenous Q24H [facility]   . meclizine  12.5 mg Oral Q8H   . pantoprazole  40 mg Oral Daily   . simvastatin  40 mg Oral QHS       Review of Systems:    General ROS: , no fever,    ENT ROS: no nasal or sinus congestion   Endocrine ROS: , no fatigue,   Respiratory ROS: no cough, shortness of breath,   Cardiovascular ROS: no chest pain    Gastrointestinal ROS: no abdominal pain,    Genito-Urinary ROS: no dysuria,    Musculoskeletal ROS: negative   Neurological ROS:  + dizziness   Dermatological ROS: negative, no rash,     Physical Exam:   BP 123/62   Pulse 78   Temp (!) 95.8 F (35.4 C) (Oral)   Resp 18   Ht  1.676 m (5\' 6" )   Wt 70.1 kg (154 lb 8 oz)   SpO2 100%   BMI 24.94 kg/m         Intake/Output Summary (Last 24 hours) at 07/26/16 1308  Last data filed at 07/25/16 2325   Gross per 24 hour   Intake              240 ml   Output              900 ml   Net             -660 ml  General appearance - alert,  and in no distress  Mental status - alert, oriented to person, place, and time  Eyes - pupils equal and reactive, extraocular eye movements intact, +nystagmus  Nose - normal and patent,  Mouth - mucous membranes moist,  Neck - Supple,  Lymphatics -deferred  Chest - clear to auscultation,  Heart - normal rate, regular rhythm, normal S1, S2  Abdomen - soft, nontender,bowel sounds normal  Neurological - alert, oriented, normal speech, no focal findings , strength 5/5 UE/LE bilaterally  Extremities  no pedal edema  Skin - normal coloration    Labs:     CBC w/Diff CMP     Recent Labs  Lab 07/25/16  0325 07/24/16  1830   WBC 4.66 5.38   Hgb 9.0* 9.9*   Hematocrit 28.5* 31.5*   Platelets 189 206   MCV 91.9 93.2   Neutrophils  --  78.3       PT/INR           Recent Labs  Lab 07/25/16  0325 07/24/16  1830   Sodium 138 138   Potassium 4.0 5.0   Chloride 108 106   CO2 25 23   BUN 21.0 19.0   Creatinine 1.2 1.6*   Glucose 141* 121*   Calcium 8.8 8.8      Glucose POCT     Recent Labs  Lab 07/25/16  0325 07/24/16  1830   Glucose 141* 121*          Recent Labs  Lab 07/25/16  0325 07/24/16  2258 07/24/16  1830   Troponin I <0.01 <0.01 <0.01         ABGs:  No results found for: ABGCOLLECTIO, ALLENSTEST, PHART, PCO2ART, PO2ART, HCO3ART, BEART, O2SATART    Urinalysis    Recent Labs  Lab 07/24/16  1829   Urine Type Clean Catch   Color, UA Amber*   Clarity, UA Sl Cloudy*   Specific Gravity UA 1.023   Urine pH 5.0   Nitrite, UA Negative   Ketones UA Trace*   Urobilinogen, UA Negative   Bilirubin, UA Negative   Blood, UA Negative   RBC, UA 6 - 10*   WBC, UA 6 - 10*         Rads:     Radiology Results (24 Hour)     Procedure  Component Value Units Date/Time    MRI Angiogram Head WO Contrast [[id number]] Collected:  07/26/16 0907    Order Status:  Completed Updated:  07/26/16 0918    Narrative:       CLINICAL HISTORY:    Dizziness. Past history of CVA.    COMPARISON: None available.    TECHNIQUE:  2-D time of flight noncontrast MR angiography was performed  through the neck arterial system. Coronal 3D gadolinium enhanced MRA was  performed for administration of 10 cc Gadavist. Multiplanar MIP  reconstructions were done from the dataset.    3-D time-of-flight methodology was utilized to assess the vessels at  the base of the brain and the major branches of the circle of Willis.  Multiplanar reconstructed images were created from the source data.      FINDINGS:  MRA Neck Vessels: NASCET criteria utilized for carotid vessel  measurements The subclavian arteries are normal in caliber and flow.  There is no  evidence of hemodynamically significant stenosis of the  common and internal carotid arteries in the neck.  There is patent  antegrade flow of the vertebral arteries and  proximal basilar artery.  The left vertebral artery is dominant. The right vertebral artery is  very small distally past the PICA but does demonstrate patent flow.    MRA Circle of Willis: The distal internal carotid arteries and the  basilar artery are normal in course and caliber. The proximal portions  of the anterior, middle and posterior cerebral arteries are normal in  course and caliber.  No aneurysms or vascular abnormalities are evident.      Impression:         1. MRA Neck Vessels: No significant stenosis. Antegrade flow in both  vertebral arteries.   2. MRA Circle of Willis: Within normal limits. No intracranial  high-grade stenosis or large vessel occlusion.    Heron Nay, MD   07/26/2016 9:13 AM      MRI Angiogram Neck W WO Contrast [[id number]] Collected:  07/26/16 1478    Order Status:  Completed Updated:  07/26/16 0918    Narrative:       CLINICAL  HISTORY:    Dizziness. Past history of CVA.    COMPARISON: None available.    TECHNIQUE:  2-D time of flight noncontrast MR angiography was performed  through the neck arterial system. Coronal 3D gadolinium enhanced MRA was  performed for administration of 10 cc Gadavist. Multiplanar MIP  reconstructions were done from the dataset.    3-D time-of-flight methodology was utilized to assess the vessels at  the base of the brain and the major branches of the circle of Willis.  Multiplanar reconstructed images were created from the source data.      FINDINGS:  MRA Neck Vessels: NASCET criteria utilized for carotid vessel  measurements The subclavian arteries are normal in caliber and flow.  There is no  evidence of hemodynamically significant stenosis of the  common and internal carotid arteries in the neck.  There is patent  antegrade flow of the vertebral arteries and proximal basilar artery.  The left vertebral artery is dominant. The right vertebral artery is  very small distally past the PICA but does demonstrate patent flow.    MRA Circle of Willis: The distal internal carotid arteries and the  basilar artery are normal in course and caliber. The proximal portions  of the anterior, middle and posterior cerebral arteries are normal in  course and caliber.  No aneurysms or vascular abnormalities are evident.      Impression:         1. MRA Neck Vessels: No significant stenosis. Antegrade flow in both  vertebral arteries.   2. MRA Circle of Willis: Within normal limits. No intracranial  high-grade stenosis or large vessel occlusion.    Heron Nay, MD   07/26/2016 9:13 AM      MRI Brain W WO Contrast [[id number]] Collected:  07/25/16 1253    Order Status:  Completed Updated:  07/25/16 1436    Narrative:       HISTORY:  Ataxia.    TECHNIQUE:    Multiplanar, multisequence MR the brain obtained without  and with IV contrast, 10 cc gadavist    PRIORS: 03/21/2015    FINDINGS:         The ventricular system is normal in  size and contour. Mild volume loss.  There is no acute intracranial hemorrhage. There is no herniation.   There are no extra-axial fluid collections.   There are no concerning areas of abnormal signal intensity. Mild T2  hyperintensity in the white matter consistent with chronic small  ischemic changes. The diffusion sequences show no restriction to suggest  an acute infarct.   The cerebellar tonsils are normally positioned.   There was no abnormal enhancement. The visualized portions of the  paranasal sinuses are clear.      Impression:        Mild chronic small vessel ischemic changes. No acute  intracranial findings.    Lorinda Creed, MD   07/25/2016 2:32 PM              Gean Quint, MD    07/26/2016  1:08 PM

## 2016-07-26 NOTE — UM Notes (Signed)
cont'd stay 07/26/16    Patient hospital day: 2    Pt seen/examined by dr Vilma Prader bagla NP concurrently, plan d/w MD in detail  Assessment:   Dizziness, suspect vertigo (+nystagmus), MRI brain, MRA neck normal, not orthostatic, MI ruled out  UTI  Iron deficiency anemia  DM2, A1c 5.8% in august, does have some hypoglycemic episodes blood sugars to 70s,  HLP  History of CVA  Plan:   Will treat for vertigo with antivert every 8 hours for 2 days scheduled   Appreciate neuro input  PT for vestibular exercises  guiac stool  IV iron while in hospital  cipro po for UTI  Hold metformin for now, use sliding scale prn  DVT prophylaxis, heparin  GI  Prophylaxis protonix  D/w nurse and pt  Reason cont hosp: dizziness, vertigo  Subjective:   C/o dizziness not related to positioning, no chest pain,     Medications:            Current Facility-Administered Medications   Medication Dose Route Frequency   . aspirin  324 mg Oral Daily   . brimonidine  1 drop Both Eyes BID   . ciprofloxacin  250 mg Oral Q12H [facility]   . heparin (porcine)  5,000 Units Subcutaneous Q12H [facility]   . iron sucrose  100 mg Intravenous Q24H [facility]   . meclizine  12.5 mg Oral Q8H   . pantoprazole  40 mg Oral Daily   . simvastatin  40 mg Oral QHS       Review of Systems:    General ROS: , no fever,    ENT ROS: no nasal or sinus congestion   Endocrine ROS: , no fatigue,   Respiratory ROS: no cough, shortness of breath,   Cardiovascular ROS: no chest pain    Gastrointestinal ROS: no abdominal pain,    Genito-Urinary ROS: no dysuria,    Musculoskeletal ROS: negative   Neurological ROS:  + dizziness   Dermatological ROS: negative, no rash,     Physical Exam:   BP 123/62   Pulse 78   Temp (!) 95.8 F (35.4 C) (Oral)   Resp 18   Ht 1.676 m (5\' 6" )   Wt 70.1 kg (154 lb 8 oz)   SpO2 100%   BMI 24.94 kg/m       CBC w/Diff CMP     Recent Labs  Lab 07/25/16  0325 07/24/16  1830   WBC 4.66 5.38   Hgb 9.0* 9.9*   Hematocrit 28.5* 31.5*   Platelets 189 206    MCV 91.9 93.2   Neutrophils  --  78.3       PT/INR           Recent Labs  Lab 07/25/16  0325 07/24/16  1830   Sodium 138 138   Potassium 4.0 5.0   Chloride 108 106   CO2 25 23   BUN 21.0 19.0   Creatinine 1.2 1.6*   Glucose 141* 121            Homero Fellers, RN, BSN, [employer], CCM  Utilization Review Nurse  [facility]  [phone] [facility] Staff ONLY)  [phone] ([employer])    Please submit all clinical review requests via fax to [phone]

## 2016-07-26 NOTE — Progress Note - Problem Oriented Charting Notewrit (Signed)
Progress Note    Date Time: 07/26/16 11:30 AM  Patient Name: [name]  Attending Physician: Gean Quint, MD      Assessment & Plan:   82 yo male with Hx CVA, DM, HLD admitted with dizziness.    Symptoms occur with sitting up or standing.    Despite this finding, patient had orthostatics done which were normal.    -  Unclear etiology of dizziness -> given description, would focus on hydration for now  -  PT evaluation completed.    Subjective:   Patient Seen and Examined. The notes from the last 24 hours were reviewed.   Pt still says when he walks he feels dizzy    Review of Systems:   No headache, eye, ear nose, throat problems; no coughing or wheezing or shortness of breath, No chest pain or orthopnea, no abdominal pain, nausea or vomiting, No pain in the body or extremities, no psychiatric, neurological, endocrine, hematological or cardiac complaints except as noted above.     Physical Exam:   Blood pressure 120/59, pulse 65, temperature (!) 93.7 F (34.3 C), temperature source Oral, resp. rate 18, height 1.676 m (5\' 6" ), weight 70.1 kg (154 lb 8 oz), SpO2 98 %.    HEENT: Normocephalic. No icter or congestion  Neck: supple, no lymphadenopathy, no thyromegaly, no JVD  Extremities: no clubbing, cyanosis, or edema  Skin: no rashes or lesions noted    Neuro:  Level of consciousness:  Alert and appropriate  Facial Movements: symmetric  Strength:  No upper extremity drift  Sensation to light touch: Intact bilaterally    Meds:      Scheduled Meds: PRN Meds:      aspirin 324 mg Oral Daily   brimonidine 1 drop Both Eyes BID   ciprofloxacin 250 mg Oral Q12H SCH   heparin (porcine) 5,000 Units Subcutaneous Q12H [facility]   iron sucrose 100 mg Intravenous Q24H SCH   meclizine 12.5 mg Oral Q8H   pantoprazole 40 mg Oral Daily   simvastatin 40 mg Oral QHS       Continuous Infusions:      dextrose 15 g of glucose PRN   And     dextrose 125 mL PRN   And     glucagon (rDNA) 1 mg PRN   gadobutrol 10 mL ONCE PRN   insulin  lispro 1-3 Units QHS and 0300 PRN   insulin lispro 1-5 Units TID AC PRN   naloxone 0.2 mg PRN           I personally reviewed all of the medications    Labs:     Recent Labs  Lab 07/25/16  0325 07/24/16  1830   Glucose 141* 121*   BUN 21.0 19.0   Creatinine 1.2 1.6*   Calcium 8.8 8.8   Sodium 138 138   Potassium 4.0 5.0   Chloride 108 106   CO2 25 23       Recent Labs  Lab 07/25/16  0325 07/24/16  1830   WBC 4.66 5.38   Hgb 9.0* 9.9*   Hematocrit 28.5* 31.5*   MCV 91.9 93.2   MCH 29.0 29.3   MCHC 31.6* 31.4*   Platelets 189 206         No results for input(s): PTT, PT, INR in the last 72 hours.       Radiology Results (24 Hour)     Procedure Component Value Units Date/Time    MRI Angiogram Head WO Contrast [[id number]]  Collected:  07/26/16 1610    Order Status:  Completed Updated:  07/26/16 0918    Narrative:       CLINICAL HISTORY:    Dizziness. Past history of CVA.    COMPARISON: None available.    TECHNIQUE:  2-D time of flight noncontrast MR angiography was performed  through the neck arterial system. Coronal 3D gadolinium enhanced MRA was  performed for administration of 10 cc Gadavist. Multiplanar MIP  reconstructions were done from the dataset.    3-D time-of-flight methodology was utilized to assess the vessels at  the base of the brain and the major branches of the circle of Willis.  Multiplanar reconstructed images were created from the source data.      FINDINGS:  MRA Neck Vessels: NASCET criteria utilized for carotid vessel  measurements The subclavian arteries are normal in caliber and flow.  There is no  evidence of hemodynamically significant stenosis of the  common and internal carotid arteries in the neck.  There is patent  antegrade flow of the vertebral arteries and proximal basilar artery.  The left vertebral artery is dominant. The right vertebral artery is  very small distally past the PICA but does demonstrate patent flow.    MRA Circle of Willis: The distal internal carotid arteries and  the  basilar artery are normal in course and caliber. The proximal portions  of the anterior, middle and posterior cerebral arteries are normal in  course and caliber.  No aneurysms or vascular abnormalities are evident.      Impression:         1. MRA Neck Vessels: No significant stenosis. Antegrade flow in both  vertebral arteries.   2. MRA Circle of Willis: Within normal limits. No intracranial  high-grade stenosis or large vessel occlusion.    Heron Nay, MD   07/26/2016 9:13 AM      MRI Angiogram Neck W WO Contrast [[id number]] Collected:  07/26/16 1191    Order Status:  Completed Updated:  07/26/16 0918    Narrative:       CLINICAL HISTORY:    Dizziness. Past history of CVA.    COMPARISON: None available.    TECHNIQUE:  2-D time of flight noncontrast MR angiography was performed  through the neck arterial system. Coronal 3D gadolinium enhanced MRA was  performed for administration of 10 cc Gadavist. Multiplanar MIP  reconstructions were done from the dataset.    3-D time-of-flight methodology was utilized to assess the vessels at  the base of the brain and the major branches of the circle of Willis.  Multiplanar reconstructed images were created from the source data.      FINDINGS:  MRA Neck Vessels: NASCET criteria utilized for carotid vessel  measurements The subclavian arteries are normal in caliber and flow.  There is no  evidence of hemodynamically significant stenosis of the  common and internal carotid arteries in the neck.  There is patent  antegrade flow of the vertebral arteries and proximal basilar artery.  The left vertebral artery is dominant. The right vertebral artery is  very small distally past the PICA but does demonstrate patent flow.    MRA Circle of Willis: The distal internal carotid arteries and the  basilar artery are normal in course and caliber. The proximal portions  of the anterior, middle and posterior cerebral arteries are normal in  course and caliber.  No aneurysms or  vascular abnormalities are evident.      Impression:  1. MRA Neck Vessels: No significant stenosis. Antegrade flow in both  vertebral arteries.   2. MRA Circle of Willis: Within normal limits. No intracranial  high-grade stenosis or large vessel occlusion.    Heron Nay, MD   07/26/2016 9:13 AM      MRI Brain W WO Contrast [[id number]] Collected:  07/25/16 1253    Order Status:  Completed Updated:  07/25/16 1436    Narrative:       HISTORY:  Ataxia.    TECHNIQUE:    Multiplanar, multisequence MR the brain obtained without  and with IV contrast, 10 cc gadavist    PRIORS: 03/21/2015    FINDINGS:         The ventricular system is normal in size and contour. Mild volume loss.  There is no acute intracranial hemorrhage. There is no herniation.   There are no extra-axial fluid collections.   There are no concerning areas of abnormal signal intensity. Mild T2  hyperintensity in the white matter consistent with chronic small  ischemic changes. The diffusion sequences show no restriction to suggest  an acute infarct.   The cerebellar tonsils are normally positioned.   There was no abnormal enhancement. The visualized portions of the  paranasal sinuses are clear.      Impression:        Mild chronic small vessel ischemic changes. No acute  intracranial findings.    Lorinda Creed, MD   07/25/2016 2:32 PM             All recent brain and spine imaging (MRI, CT) personally reviewed.    Chart reviewed    Case discussed with: patient and nurse    35 minutes; >50% time spent in counseling or coordination of care    Signed by: Ardelle Anton, MD  Spectralink: [phone]       Answering Service: [phone]

## 2016-07-26 NOTE — Plan of Care (Signed)
Problem: Hemodynamic Status: Cardiac  Goal: Stable vital signs and fluid balance  Outcome: Progressing      Comments: Pt awake,alert, vswnl,afebrile, no distress on RA,denies pain SR on monitor, orthostatic vs negative, complain of feeling dizzy when standing up,continue current regimen.

## 2016-07-26 NOTE — Progress Notes (Signed)
Pt admitted from home and lives alone d/t hypotension and lightheaded. CM met with pt at bedside and he was alert and oriented. No hx of SNF, HH or acute rehab. Pt confirmed PCP, Dr. Selena Batten.     DCP is return home with [facility] PT, CM sent referral, and might need DME.    CM to meet with pt regarding HH aide.      Jimi Schappert,MSW  Care Coordinator  [phone]  ]     07/26/16 1346   Patient Type   Within 30 Days of Previous Admission? No   Healthcare Decisions   Interviewed: Patient   Orientation/Decision Making Abilities of Patient Alert and Oriented x3, able to make decisions   Advance Directive Patient does not have advance directive   Healthcare Agent Appointed Yes   Healthcare Agent's Name [name], sister   Healthcare Agent's Phone Number [phone]   Prior to admission   Prior level of function Independent with ADLs;Ambulates independently   Type of Residence Private residence   Home Layout One level   Have running water, electricity, heat, etc? Yes   Living Arrangements Alone   How do you get to your MD appointments? self   How do you get your groceries? self   Who fixes your meals? self   Who does your laundry? self   Who picks up your prescriptions? self   Dressing Independent   Grooming Independent   Feeding Independent   Bathing Independent   Toileting Independent   Adult Protective Services (APS) involved? No   Discharge Planning   Support Systems Family members   Patient expects to be discharged to: home   Anticipated [place] plan discussed with: Same as interviewed   Follow up appointment scheduled? No   Mode of transportation: Private car (family member)   Consults/Providers   PT Evaluation Needed 1   OT Evalulation Needed 1   SLP Evaluation Needed 2   Outcome Palliative Care Screen Screened but did not meet criteria for intervention   Correct PCP listed in Epic? Yes

## 2016-07-26 NOTE — Plan of Care (Signed)
Problem: Peripheral Neurovascular Impairment  Goal: Extremity color, movement, sensation are maintained or improved  Outcome: Progressing   07/26/16 2308   Goal/Interventions addressed this shift   Extremity color, movement, sensation are maintained or improved  Increase mobility as tolerated/progressive mobility;Assess and monitor application of corrective devices (cast, brace, splint), check skin integrity;Assess extremity for proper alignment;VTE Prevention: Administer anticoagulant(s) and/or apply anti-embolism stockings/devices as ordered;Teach/review/reinforce ankle pump exercises

## 2016-07-27 LAB — CBC AND DIFFERENTIAL
Absolute NRBC: 0 10*3/uL
Basophils Absolute Automated: 0.02 10*3/uL (ref 0.00–0.20)
Basophils Automated: 0.5 %
Eosinophils Absolute Automated: 0.13 10*3/uL (ref 0.00–0.70)
Eosinophils Automated: 3 %
Hematocrit: 27.5 % — ABNORMAL LOW (ref 42.0–52.0)
Hgb: 8.6 g/dL — ABNORMAL LOW (ref 13.0–17.0)
Immature Granulocytes Absolute: 0.01 10*3/uL
Immature Granulocytes: 0.2 %
Lymphocytes Absolute Automated: 1.29 10*3/uL (ref 0.50–4.40)
Lymphocytes Automated: 30 %
MCH: 29.4 pg (ref 28.0–32.0)
MCHC: 31.3 g/dL — ABNORMAL LOW (ref 32.0–36.0)
MCV: 93.9 fL (ref 80.0–100.0)
MPV: 10.7 fL (ref 9.4–12.3)
Monocytes Absolute Automated: 0.31 10*3/uL (ref 0.00–1.20)
Monocytes: 7.2 %
Neutrophils Absolute: 2.54 10*3/uL (ref 1.80–8.10)
Neutrophils: 59.1 %
Nucleated RBC: 0 /100 WBC (ref 0.0–1.0)
Platelets: 197 10*3/uL (ref 140–400)
RBC: 2.93 10*6/uL — ABNORMAL LOW (ref 4.70–6.00)
RDW: 16 % — ABNORMAL HIGH (ref 12–15)
WBC: 4.3 10*3/uL (ref 3.50–10.80)

## 2016-07-27 LAB — ECG 12-LEAD
Atrial Rate: 70 {beats}/min
Atrial Rate: 71 {beats}/min
P Axis: 35 degrees
P Axis: -2 degrees
P-R Interval: 174 ms
P-R Interval: 184 ms
Q-T Interval: 382 ms
Q-T Interval: 382 ms
QRS Duration: 82 ms
QRS Duration: 82 ms
QTC Calculation (Bezet): 412 ms
QTC Calculation (Bezet): 415 ms
R Axis: 49 degrees
R Axis: 9 degrees
T Axis: 56 degrees
T Axis: 17 degrees
Ventricular Rate: 70 {beats}/min
Ventricular Rate: 71 {beats}/min

## 2016-07-27 LAB — BASIC METABOLIC PANEL
Anion Gap: 4 — ABNORMAL LOW (ref 5.0–15.0)
BUN: 15 mg/dL (ref 9.0–28.0)
CO2: 25 mEq/L (ref 22–29)
Calcium: 8.5 mg/dL (ref 7.9–10.2)
Chloride: 111 mEq/L (ref 100–111)
Creatinine: 1 mg/dL (ref 0.7–1.3)
Glucose: 138 mg/dL — ABNORMAL HIGH (ref 70–100)
Potassium: 4.2 mEq/L (ref 3.5–5.1)
Sodium: 140 mEq/L (ref 136–145)

## 2016-07-27 LAB — PHOSPHORUS: Phosphorus: 2.9 mg/dL (ref 2.3–4.7)

## 2016-07-27 LAB — GLUCOSE WHOLE BLOOD - POCT
Whole Blood Glucose POCT: 91 mg/dL (ref 70–100)
Whole Blood Glucose POCT: 196 mg/dL — ABNORMAL HIGH (ref 70–100)
Whole Blood Glucose POCT: 143 mg/dL — ABNORMAL HIGH (ref 70–100)
Whole Blood Glucose POCT: 136 mg/dL — ABNORMAL HIGH (ref 70–100)

## 2016-07-27 LAB — GFR: EGFR: >60

## 2016-07-27 LAB — MAGNESIUM: Magnesium: 1.7 mg/dL (ref 1.6–2.6)

## 2016-07-27 LAB — HEMOLYSIS INDEX: Hemolysis Index: 17 (ref 0–18)

## 2016-07-27 MED ORDER — SODIUM CHLORIDE 0.9 % IV SOLN
100.0000 mL/h | INTRAVENOUS | Status: AC
Start: 2016-07-27 — End: 2016-07-28

## 2016-07-27 NOTE — Progress Notes (Signed)
Referral sent to home health awaiting placement.

## 2016-07-27 NOTE — PT Progress Note (Signed)
Physical Therapy Note    [facility]  Physical Therapy Treatment    Patient:  [name] MRN#:  [id number]  Unit:  [facility] INTERMEDIATE CARE Room/Bed:  Y7829/F6213-Y    Time of treatment: Time Calculation  PT Received On: 07/27/16  Start Time: 1520  Stop Time: 1544  Time Calculation (min): 24 min           Chart Review and Collaboration with Care Team: 5 minutes      PT Visit Number: 2    Precautions:   Precautions  Weight Bearing Status: no restrictions  Other Precautions:  (Diziness)    Updated X-Rays/Tests/Labs:  Lab Results   Component Value Date/Time    HGB 8.6 (L) 07/27/2016 04:35 AM    HCT 27.5 (L) 07/27/2016 04:35 AM    HCT 25.2 08/05/2015 11:39 AM    K 4.2 07/27/2016 04:35 AM    NA 140 07/27/2016 04:35 AM    TROPI <0.01 07/25/2016 03:25 AM    TROPI <0.01 07/24/2016 10:58 PM    TROPI <0.01 07/24/2016 06:30 PM    TROPI 0.01 06/19/2016 08:24 PM     New imaging reviewed prior to PT treatment-see Epic for details    Subjective: "I still have dizziness. I don't think it is getting better."       Pain Assessment  Pain Assessment: No/denies pain           Patient's medical condition is appropriate for Physical Therapy intervention at this time.  Patient is agreeable to participation in the therapy session. Nursing clears patient for therapy.    Objective:  Observation of Patient/Vital Signs: BP 141/63 SpO2 98%. VSS throughout session.    Patient received in bed with telemetry  and intravenous (IV) line in place.    Cognition/Neuro Status  Arousal/Alertness: Appropriate responses to stimuli  Attention Span: Appears intact  Orientation Level: Oriented X4  Memory: Appears intact  Following Commands: Follows all commands and directions without difficulty  Safety Awareness: minimal verbal instruction  Insights: Fully aware of deficits;Educated in safety awareness  Problem Solving: Able to problem solve independently  Behavior: calm;cooperative               Functional Mobility  Rolling:  Independent;to Left;to Right (instruction for technique)  Supine to Sit: Independent  Sit to Stand: Supervision  Stand to Sit: Supervision     Locomotion  Ambulation: Contact Guard Assist (progressed to SBA)  Ambulation Distance (Feet): 170 Feet  Pattern: R foot decreased clearance;L foot decreased clearance;Wide BOS    Therapeutic Exercise:   Completed rolling in bed prior to OOB activity: reported slight dizziness with R sidelying that lasted briefly (no nystagmus); no dizziness with returning to supine or with L sidelying  Smooth Pursuit and Saccades performed-no nystagmus in any direction-dizziness remained the same  Head thrust test: negative  VOR Cancellation: horizontal and vertical head movements-completed sitting EOB and standing after amb; reported improvement of symptoms upon completion        Neuro Re-Ed  Sitting Balance: without support;without instruction;independent  Standing Balance: without instruction;without support;supervision           Educated the patient to role of physical therapy, plan of care, goals of therapy and HEP, safety with mobility and ADLs, discharge instructions, home safety. Encouraged pt to sit up in bedside chair throughout the day and ambulate often with RN/Clin tech-verbalized understanding. Patient left seated EOB with RN present and call bell and all personal items/needs within reach. RN  notified of session outcome.       Assessment: Pt tolerated longer ambulation distance with no AD and with no LOB. Performed vestibular assessment and exercises and no nystagmus present with activities. Did report some improvement with VOR cancellation exercises. At times, it was difficult to determine if pt has true vertigo based on report of symptoms and performance during session. Will continue to benefit from skilled PT interventions to maximize independence and safety with all functional mobility. Home with supervision (pt stated he could call some one to assist as needed) and HHPT  remains Leonard recommendation.      Plan: Treatment/Interventions: Exercise, Gait training, Functional transfer training, Endurance training      PT Frequency: 3-4x/wk   Continue plan of care.    Goals: Goals  Goal Formulation: With patient  Time for Goal Acheivement: 3 visits  Pt Will Transfer Bed/Chair: independent, to maximize functional mobility and independence, Not met  Pt Will Ambulate: > 200 feet, independent, to maximize functional mobility and independence, Partly met  Pt Will Perform Home Exer Program: independent, to maximize functional mobility and independence, Partly met        DME Recommended for Discharge: Other (Comment) (none currently)  Discharge Recommendation: Home with supervision, Home with home health PT, Other (Comment) (will continue to benefit from supervision when OOB-but dizziness is improving)    Ranae Pila, PT, DPT  [phone]  07/27/2016 3:57 PM

## 2016-07-27 NOTE — Plan of Care (Signed)
Problem: Physical Therapy  Goal: By discharge, patient will perform mobility at the  patient's highest functional potential. See PT evaluation for goals.  Outcome: Progressing  Discharge Recommendation: Home with supervision, Home with home health PT, Other (Comment) (will continue to benefit from supervision when OOB-but dizziness is improving)  DME Recommended for Discharge: Other (Comment) (none currently)    Is an Occupational Therapy Evaluation Indicated at this time? No, the patient does not require a OT evaluation.    Treatment/Interventions: Exercise, Gait training, Functional transfer training, Endurance training  PT Frequency: 3-4x/wk     Early/Progressive Mobility Protocol Level: Step 7: Ambulate out of Room (with assistance)    (Please See Therapy Evaluation for device and assistance level needed)    Goals:   Goals  Goal Formulation: With patient  Time for Goal Acheivement: 3 visits  Pt Will Transfer Bed/Chair: independent, to maximize functional mobility and independence, Not met  Pt Will Ambulate: > 200 feet, independent, to maximize functional mobility and independence, Partly met  Pt Will Perform Home Exer Program: independent, to maximize functional mobility and independence, Partly met

## 2016-07-27 NOTE — Progress Notes (Signed)
Home Health Referral          Referral from Dorine Ndayigiziye (Case Manager) for home health care upon discharge.    By [employer], the patient has the right to freely choose a home care provider.  Arrangements have been made with:     A company of the patients choosing. We have supplied the patient with a listing of providers in your area who asked to be included and participate in Medicare.   [facility], a home care agency that provides both adult home care services which is a wholly owned and operated by [employer] and participates in [employer]   The preferred provider of your insurance company. Choosing a home care provider other than your insurance company's preferred provider may affect your insurance coverage.    The Home Health Care Referral Form acknowledging the voluntary selection of the home care company has been completed, signed, and is on file.    Home Health Discharge Information     Your doctor has ordered Skilled Nursing and Physical Therapy in-home service(s) for you while you recuperate at home, to assist you in the transition from hospital to home.      The agency that you or your representative chose to provide the service:  Name of Home Health Agency: Other (comment box) ([facility] [phone]   )]    The above services were set up by:  Janelle Floor  [facility] Liaison)   Phone  [phone]      Signed by: Janelle Floor  Date Time: 07/27/16 3:19 PM

## 2016-07-27 NOTE — Plan of Care (Signed)
Problem: Neurological Deficit  Goal: Neurological status is stable or improving  Outcome: Progressing  Pt a/o x3, vswnl,no distress on RA, SR on monitor, complain of slight dizziness on standing, Orthostatic vitals signs negative, MRI/MRA of neck and Brain done, followed by Neuro, continue med as ordered, and montor for any changes.

## 2016-07-27 NOTE — Progress Notes (Signed)
PROGRESS NOTE                                                 Gean Quint MD, [place], CMD                                                             [phone]    Date Time: 07/27/16 1:52 PM  Patient Name: [name], [name] 82 y.o. male admitted with <principal problem not specified>  Admit Date: 07/24/2016    Patient status: Inpatient  Hospital Day: 3           Assessment:     Vertigo.  Still not improving  Urinary tract infection.  Iron deficiency anemia.  Diabetes mellitus  Hyperlipidemia    Plan:        Antivert  Physical therapy for vestibular exercises.  MRI ruled out stroke.  Broad-spectrum antibiotic for urinary tract infection      Reason for contd. Hospitalization = treatment of vertigo  Subjective:       Patient still has vertigo, unable to ambulate without help            Medications:     Current Facility-Administered Medications   Medication Dose Route Frequency   . aspirin  324 mg Oral Daily   . brimonidine  1 drop Both Eyes BID   . ciprofloxacin  250 mg Oral Q12H [facility]   . heparin (porcine)  5,000 Units Subcutaneous Q12H [facility]   . iron sucrose  100 mg Intravenous Q24H [facility]   . meclizine  12.5 mg Oral Q8H   . pantoprazole  40 mg Oral Daily   . simvastatin  40 mg Oral QHS       Review of Systems:        Gen. no fever, chills   ENT no sore throat.  Has vertigo.   Endocrine has no fatigue   Respiratory no shortness of breath or cough.   Cardio vascular no chest pain or palpitation   Gastrointestinal no nausea, vomiting or diarrhea   Genitourinary no burning in the urine or hematuria   Musculoskeletal no deformities next neurological patient has vertigo at rest as well as with ambulation   Dermatology no skin rash       Physical Exam:     Vitals:    07/27/16 1204   BP: 141/63   Pulse: 81   Resp: 18   Temp: (!) 96.2 F (35.7 C)   SpO2: 98%         Intake/Output Summary (Last 24 hours) at 07/27/16 1352  Last data filed at 07/27/16 1300   Gross per 24 hour   Intake              240 ml   Output               800 ml   Net             -560 ml           Gen. appearance patient does not appear to be in any distress  Mental status fully alert awake and oriented  Eyes both pupils are  reactive to the light had nystagmus on deviation to the left  Mouth clear  Neck no elevated JVD, lymphadenopathy, thyromegaly  Chest without any crackles or wheezing  Heart without any abnormal heart rate is no murmur.  Abdomen soft, nontender  Neuro no focal weakness  Extremities no edema, clubbing or cyanosis  Skin without any rash    Labs:     CBC w/Diff CMP     Recent Labs  Lab 07/27/16  0435 07/25/16  0325 07/24/16  1830   WBC 4.30 4.66 5.38   Hgb 8.6* 9.0* 9.9*   Hematocrit 27.5* 28.5* 31.5*   Platelets 197 189 206   MCV 93.9 91.9 93.2   Neutrophils 59.1  --  78.3       PT/INR           Recent Labs  Lab 07/27/16  0435 07/25/16  0325 07/24/16  1830   Sodium 140 138 138   Potassium 4.2 4.0 5.0   Chloride 111 108 106   CO2 25 25 23    BUN 15.0 21.0 19.0   Creatinine 1.0 1.2 1.6*   Glucose 138* 141* 121*   Calcium 8.5 8.8 8.8   Magnesium 1.7  --   --    Phosphorus 2.9  --   --       Glucose POCT     Recent Labs  Lab 07/27/16  0435 07/25/16  0325 07/24/16  1830   Glucose 138* 141* 121*          Recent Labs  Lab 07/25/16  0325 07/24/16  2258 07/24/16  1830   Troponin I <0.01 <0.01 <0.01         ABGs:  No results found for: ABGCOLLECTIO, ALLENSTEST, PHART, PCO2ART, PO2ART, HCO3ART, BEART, O2SATART    Urinalysis    Recent Labs  Lab 07/24/16  1829   Urine Type Clean Catch   Color, UA Amber*   Clarity, UA Sl Cloudy*   Specific Gravity UA 1.023   Urine pH 5.0   Nitrite, UA Negative   Ketones UA Trace*   Urobilinogen, UA Negative   Bilirubin, UA Negative   Blood, UA Negative   RBC, UA 6 - 10*   WBC, UA 6 - 10*         Rads:   No results found.    Gean Quint, MD  07/27/2016  1:52 PM

## 2016-07-28 LAB — GLUCOSE WHOLE BLOOD - POCT: Whole Blood Glucose POCT: 158 mg/dL — ABNORMAL HIGH (ref 70–100)

## 2016-07-28 MED ORDER — CIPROFLOXACIN HCL 250 MG PO TABS
250.0000 mg | ORAL_TABLET | Freq: Two times a day (BID) | ORAL | 0 refills | Status: DC
Start: 2016-07-28 — End: 2016-07-29

## 2016-07-28 MED ORDER — MECLIZINE HCL 12.5 MG PO TABS
12.5000 mg | ORAL_TABLET | Freq: Three times a day (TID) | ORAL | Status: DC | PRN
Start: 2016-07-28 — End: 2016-07-29

## 2016-07-28 NOTE — Progress Notes (Signed)
MEDICINE PROGRESS NOTE                                                 Gean Quint MD, [place], CMD                                                             [phone]    Date Time: 07/28/16 10:06 AM  Patient Name: [name],[name]  Patient status: Inpatient  Patient hospital day: 4    Pt seen/examined by dr Vilma Prader bagla NP concurrently, plan d/w MD in detail      Assessment:   Dizziness, suspect vertigo (+nystagmus), MRI brain, MRA neck normal, not orthostatic, MI ruled out, pt reports improvement in dizziness but upon getting up, still dizzy  UTI on cipro  Iron deficiency anemia, hct stable  DM2, A1c 5.8% in august, does have some hypoglycemic episodes blood sugars to 70s, on actos and metformin at home  HLP  History of CVA  Plan:   Cancel plans for [place] since dizzy when he got up and lives alone  Nurse to help pt get up during the day with assist, to get up slowly  antivert prn  PT vestibular exercises  Continue cipro for UTI  Sliding scale prn for sugars, pt states on actos and metformin at home, rec d/c actos and keep metformin since unlikely to cause hypoglycemic episodes  Continue IV iron while in hospital, resume vitron C on [place]  DVT prophylaxis, heparin  GI  Prophylaxis, protonix  Plan d/w nurse, case manager, pt  Reason cont hosp: vertigo  Subjective:   Feels overall better, but got dizzy when nurse got him up to ambulate    Medications:     Current Facility-Administered Medications   Medication Dose Route Frequency   . aspirin  324 mg Oral Daily   . brimonidine  1 drop Both Eyes BID   . ciprofloxacin  250 mg Oral Q12H [facility]   . heparin (porcine)  5,000 Units Subcutaneous Q12H [facility]   . iron sucrose  100 mg Intravenous Q24H [facility]   . pantoprazole  40 mg Oral Daily   . simvastatin  40 mg Oral QHS       Review of Systems:    General ROS:, no fever,   ENT ROS: negative   Endocrine ROS: negative,    Respiratory ROS: no cough, shortness of breath,   Cardiovascular ROS: no chest pain    Gastrointestinal  ROS: no abdominal pain,    Genito-Urinary ROS: no dysuria,    Musculoskeletal ROS: negative   Neurological ROS:+ dizziness   Dermatological ROS: negative, no rash,     Physical Exam:   BP 134/58   Pulse 83   Temp (!) 96.1 F (35.6 C) (Oral)   Resp 17   Ht 1.676 m (5\' 6" )   Wt 72.8 kg (160 lb 9.6 oz)   SpO2 98%   BMI 25.92 kg/m         Intake/Output Summary (Last 24 hours) at 07/28/16 1006  Last data filed at 07/28/16 0800   Gross per 24 hour   Intake  4170 ml   Output             2300 ml   Net             1870 ml       General appearance - alert,  and in no distress  Mental status - alert, oriented to person, place, and time  Eyes - pupils equal and reactive,   Nose - normal and patent,   Mouth - mucous membranes moist,   Neck - Supple,   Lymphatics - deferred  Chest - clear to auscultation,  Heart - normal rate, regular rhythm, normal S1, S2  Abdomen - soft, nontender, , bowel sounds normal  Neurological -  no focal findings  Extremities -  no pedal edema,   Skin - normal coloration     Labs:     CBC w/Diff CMP     Recent Labs  Lab 07/27/16  0435 07/25/16  0325 07/24/16  1830   WBC 4.30 4.66 5.38   Hgb 8.6* 9.0* 9.9*   Hematocrit 27.5* 28.5* 31.5*   Platelets 197 189 206   MCV 93.9 91.9 93.2   Neutrophils 59.1  --  78.3       PT/INR           Recent Labs  Lab 07/27/16  0435 07/25/16  0325 07/24/16  1830   Sodium 140 138 138   Potassium 4.2 4.0 5.0   Chloride 111 108 106   CO2 25 25 23    BUN 15.0 21.0 19.0   Creatinine 1.0 1.2 1.6*   Glucose 138* 141* 121*   Calcium 8.5 8.8 8.8   Magnesium 1.7  --   --    Phosphorus 2.9  --   --       Glucose POCT     Recent Labs  Lab 07/27/16  0435 07/25/16  0325 07/24/16  1830   Glucose 138* 141* 121*          Recent Labs  Lab 07/25/16  0325 07/24/16  2258 07/24/16  1830   Troponin I <0.01 <0.01 <0.01         ABGs:  No results found for: ABGCOLLECTIO, ALLENSTEST, PHART, PCO2ART, PO2ART, HCO3ART, BEART, O2SATART    Urinalysis    Recent Labs  Lab  07/24/16  1829   Urine Type Clean Catch   Color, UA Amber*   Clarity, UA Sl Cloudy*   Specific Gravity UA 1.023   Urine pH 5.0   Nitrite, UA Negative   Ketones UA Trace*   Urobilinogen, UA Negative   Bilirubin, UA Negative   Blood, UA Negative   RBC, UA 6 - 10*   WBC, UA 6 - 10*         Rads:     Radiology Results (24 Hour)     ** No results found for the last 24 hours. Gean Quint, MD    07/28/2016  10:06 AM

## 2016-07-28 NOTE — UM Notes (Signed)
Interval notes 07/28/16    Date Time: 07/27/16 1:52 PM  Patient Name: [name], [name] 82 y.o. male admitted with <principal problem not specified>  Admit Date: 07/24/2016    Patient status: Inpatient  Hospital Day: 3   Assessment:   Vertigo.  Still not improving  Urinary tract infection.  Iron deficiency anemia.  Diabetes mellitus  Hyperlipidemia  Plan:   Antivert  Physical therapy for vestibular exercises.  MRI ruled out stroke.  Broad-spectrum antibiotic for urinary tract infection    MEDICINE PROGRESS NOTE  Date Time: 07/28/16 10:06 AM  Patient hospital day: 4  Assessment:   Dizziness, suspect vertigo (+nystagmus), MRI brain, MRA neck normal, not orthostatic, MI ruled out, pt reports improvement in dizziness but upon getting up, still dizzy  UTI on cipro  Iron deficiency anemia, hct stable  DM2, A1c 5.8% in august, does have some hypoglycemic episodes blood sugars to 70s, on actos and metformin at home  HLP  History of CVA  Plan:   Cancel plans for [place] since dizzy when he got up and lives alone  Nurse to help pt get up during the day with assist, to get up slowly  antivert prn  PT vestibular exercises  Continue cipro for UTI  Sliding scale prn for sugars, pt states on actos and metformin at home, rec d/c actos and keep metformin since unlikely to cause hypoglycemic episodes  Continue IV iron while in hospital, resume vitron C on [place]  DVT prophylaxis, heparin  GI  Prophylaxis, protonix  Plan d/w nurse, case manager, pt  Reason cont hosp: vertigo  Subjective:   Feels overall better, but got dizzy when nurse got him up to ambulate    Temp:  [95 F (35 C)-97.2 F (36.2 C)]   Heart Rate:  [76-91]   Resp Rate:  [17-19]   BP: (94-169)/(52-71)   SpO2:  [96 %-99 %]   Weight:  [72.8 kg (160 lb 9.6 oz)]     Last recorded pain score:  Pain Scale Used: Numeric Scale (0-10)  Pain Score: 0-No pain     Lab Results last 48 Hours     Glucose Whole Blood - POCT [[id number]]  (Abnormal) Collected:  07/27/16 2018     Updated:   07/27/16 2051     POCT - Glucose Whole blood 136 (H) mg/dL     Glucose Whole Blood - POCT [[id number]]  (Abnormal) Collected:  07/27/16 1625     Updated:  07/27/16 1658     POCT - Glucose Whole blood 143 (H) mg/dL     Glucose Whole Blood - POCT [[id number]]  (Abnormal) Collected:  07/27/16 1201     Updated:  07/27/16 1330     POCT - Glucose Whole blood 196 (H) mg/dL     Basic Metabolic Panel [[id number]]  (Abnormal) Collected:  07/27/16 0435    Specimen:  Blood Updated:  07/27/16 0511     Glucose 138 (H) mg/dL      Anion Gap 4.0 (L)    GFR [[id number]] Collected:  07/27/16 0435     Updated:  07/27/16 0511     EGFR >60.0    CBC and differential [[id number]]  (Abnormal) Collected:  07/27/16 0435    Specimen:  Blood from Blood Updated:  07/27/16 0452     Hgb 8.6 (L) g/dL      Hematocrit 53.6 (L) %      RBC 2.93 (L) x10 6/uL      MCHC 31.3 (L) g/dL  RDW 16 (H) %      Absolute NRBC 0.00 x10 3/uL     Glucose Whole Blood - POCT [[id number]]  (Abnormal) Collected:  07/26/16 2020     Updated:  07/26/16 2026     POCT - Glucose Whole blood 217 (H) mg/dL     Glucose Whole Blood - POCT [[id number]]  (Abnormal) Collected:  07/26/16 1758     Updated:  07/26/16 1805     POCT - Glucose Whole blood 109 (H) mg/dL         Scheduled Meds:  Current Facility-Administered Medications   Medication Dose Route Frequency   . aspirin  324 mg Oral Daily   . brimonidine  1 drop Both Eyes BID   . ciprofloxacin  250 mg Oral Q12H [facility]   . heparin (porcine)  5,000 Units Subcutaneous Q12H [facility]   . iron sucrose  100 mg Intravenous Q24H [facility]   . pantoprazole  40 mg Oral Daily   . simvastatin  40 mg Oral QHS     Orders: labs, POCT glucose monitoring, I&O, VS, telemetry, SCDs, PT    Devin Going , RN BSN  Utilization Review Case Manager  Case Management  [facility]  [address]  [place], [place] [address]  T [phone] S [phone]  F [phone]

## 2016-07-28 NOTE — Progress Notes (Addendum)
Pt to discharge home with needs tomorrow morning at 10:30 and will use PTS (wheelchair) to transportation.    CM called PCP, Dr. Selena Batten whoever the office was closed.    Annalisia Ingber,MSW  Care Coordinator  [phone]       07/28/16 [phone]   Discharge Disposition   Patient preference/choice provided? Yes   Physical Discharge Disposition Home with Needs   Mode of Transportation Wheelchair [place]   Patient/Family/POA notified of transfer plan Yes   CM Interventions   Follow up appointment scheduled? No   Reason no follow up scheduled? Left voicemail   Multidisciplinary rounds/family meeting before d/c? Yes   Medicare Checklist   Is this a Medicare patient? No

## 2016-07-28 NOTE — Plan of Care (Signed)
Problem: Safety  Goal: Patient will be free from injury during hospitalization  Outcome: Progressing   07/28/16 0313   Goal/Interventions addressed this shift   Patient will be free from injury during hospitalization  Assess patient's risk for falls and implement fall prevention plan of care per policy;Provide and maintain safe environment;Ensure appropriate safety devices are available at the bedside;Include patient/ family/ care giver in decisions related to safety;Hourly rounding       Problem: Neurological Deficit  Goal: Neurological status is stable or improving  Outcome: Progressing   07/28/16 0313   Goal/Interventions addressed this shift   Neurological status is stable or improving Monitor/assess/document neurological assessment (Stroke: every 4 hours)       Comments: Pt is aox4;still having dizziness but it improved after taking his antivert as verbalized by the patient;vss;SR on tele;denies pain;fall precaution observed assisted to the bathroom;bed alarm on & call bell with in reach.Pt is on IV fluids infusing well;due meds given ;needs attended;no distress;will continue plan of care.

## 2016-07-28 NOTE — Progress Notes (Signed)
07/28/16 0956   Vitals   Heart Rate 91   BP 169/71   Patient Position Sitting     Attempted patient to get OOB to walk in hallway. Patient stated that he was feeling dizzy. BP checked when sitting and standing. Standing 134/58. Patient stated that he did not feel dizzy after standing for 5 mins.

## 2016-07-28 NOTE — Plan of Care (Signed)
Problem: Hemodynamic Status: Cardiac  Goal: Stable vital signs and fluid balance  Outcome: Progressing   07/28/16 1624   Goal/Interventions addressed this shift   Stable vital signs and fluid balance Monitor/assess vital signs and telemetry per unit protocol     Patient still having complaints of dizziness when getting OOB to standing. Patient educated on getting up slowly. Will continue to monitor.

## 2016-07-29 LAB — GLUCOSE WHOLE BLOOD - POCT: Whole Blood Glucose POCT: 86 mg/dL (ref 70–100)

## 2016-07-29 MED ORDER — CIPROFLOXACIN HCL 250 MG PO TABS
250.0000 mg | ORAL_TABLET | Freq: Two times a day (BID) | ORAL | 0 refills | Status: AC
Start: 2016-07-29 — End: 2016-07-31

## 2016-07-29 MED ORDER — MECLIZINE HCL 12.5 MG PO TABS
12.5000 mg | ORAL_TABLET | Freq: Three times a day (TID) | ORAL | 0 refills | Status: DC | PRN
Start: 2016-07-29 — End: 2017-04-23

## 2016-07-29 NOTE — UM Notes (Signed)
Discharge notes 07/28/16  Per nursing:  Patient, [name], seen by attending physician on 07/29/2016 and discharge orders noted. Current vital signs are   Vitals       Vitals:    07/29/16 0649   BP: 127/59   Pulse: 73   Resp: 17   Temp: 97 F (36.1 C)   SpO2: 96%      . Patient provided with discharge instructions, discharge prescriptions, and follow up instructions. Patient verbalizes understanding of all discharge information given.   Patient escorted out of facility by staff in a wheelchair.  Patient to go home via wheelchair van. Tele and IV d/c. Telemetry box returned to telemetry room per clin tech.     Devin Going , RN BSN  Utilization Review Case Manager  Case Management  [facility]  [address]  [place], [place] [address]  T [phone] S [phone]  F [phone]

## 2016-07-29 NOTE — Progress Notes (Signed)
Patient, [name], seen by attending physician on 07/29/2016 and discharge orders noted. Current vital signs are   Vitals:    07/29/16 0649   BP: 127/59   Pulse: 73   Resp: 17   Temp: 97 F (36.1 C)   SpO2: 96%   . Patient provided with discharge instructions, discharge prescriptions, and follow up instructions. Patient verbalizes understanding of all discharge information given.   Patient escorted out of facility by staff in a wheelchair.  Patient to go home via wheelchair van. Tele and IV d/c. Telemetry box returned to telemetry room per clin tech.

## 2016-07-29 NOTE — Discharge Instr - Activity (Signed)
Activity as tolerated

## 2016-07-29 NOTE — Discharge Instr - AVS First Page (Signed)
Reason for your Hospital Admission:  dizziness    Instructions for after your discharge:  Get up slowly from a lying down position  antivert as needed for dizziness  Follow up with dr Selena Batten within one week

## 2016-07-29 NOTE — Progress Notes (Signed)
Pt to discharge home with wheelchair at 10:30 am. CM informed pt's RN regarding discharge time.    Charge RN aware and MD. [facility] services set up.    Seona Clemenson,MSW  Care Coordinator  [phone]       07/29/16 [phone]   Discharge Disposition   Patient preference/choice provided? Yes   Physical Discharge Disposition Home with Needs   Name of Home Health Agency Other (comment box)   Mode of Transportation Wheelchair [place]   Patient/Family/POA notified of transfer plan Yes   Patient agreeable to discharge plan/expected d/c date? Yes   Family/POA agreeable to discharge plan/expected d/c date? Yes   Bedside nurse notified of transport plan? Yes   CM Interventions   Follow up appointment scheduled? No   Reason no follow up scheduled? Left voicemail   Multidisciplinary rounds/family meeting before d/c? Yes   Medicare Checklist   Is this a Medicare patient? No

## 2016-07-29 NOTE — Progress Notes (Signed)
Home Health Referral          Referral from Dorine Ndayigiziye (Case Manager) for home health care upon discharge.    By [employer], the patient has the right to freely choose a home care provider.  Arrangements have been made with:     A company of the patients choosing. We have supplied the patient with a listing of providers in your area who asked to be included and participate in Medicare.   [facility], a home care agency that provides both adult home care services which is a wholly owned and operated by [employer] and participates in [employer]   The preferred provider of your insurance company. Choosing a home care provider other than your insurance company's preferred provider may affect your insurance coverage.    The Home Health Care Referral Form acknowledging the voluntary selection of the home care company has been completed, signed, and is on file.      Home Health Discharge Information     Your doctor has ordered Skilled Nursing in-home service(s) for you while you recuperate at home, to assist you in the transition from hospital to home.    The agency that you or your representative chose to provide the service:  Name of Home Health Agency: Other (comment box) [facility]  [phone]     )]    The above services were set up by:  Janelle Floor  [facility] Liaison)   Phone  [phone]                 Signed by: Janelle Floor  Date Time: 07/29/16 4:23 PM

## 2016-07-29 NOTE — Plan of Care (Signed)
Problem: Hemodynamic Status: Cardiac  Goal: Stable vital signs and fluid balance  Outcome: Progressing   07/29/16 0026   Goal/Interventions addressed this shift   Stable vital signs and fluid balance Monitor/assess vital signs and telemetry per unit protocol;Weigh on admission and record weight daily;Monitor intake/output per unit protocol and/or LIP order;Monitor for leg swelling/edema and report to LIP if abnormal;Monitor lab values;Assess signs and symptoms associated with cardiac rhythm changes

## 2016-07-29 NOTE — Discharge Instructions (Signed)
Managing Balance Problems: Vestibular Rehabilitation Therapy  An inner ear problem can knock out part of the balance system. Vestibular rehabilitation therapy helps you learn how to rely on specific parts of your balance system.  Checking eye movement  The therapist will check for nystagmus. This is an automatic, jumpy eye movement. Nystagmus in certain positions can mean an inner ear problem. It can even show which semicircular canal is affected. The therapist will watch your eyes while you move into different positions.  Treating your condition    Treatment can include:   Canalith repositioning. This is a series of guided head and body movements. It helps move crystals, easing symptoms of benign positional vertigo (BPV).   Habituation exercises to retrain your balance system. The therapist will teach you how to do these exercises at home.   Gaze stabilization exercises. These areto retrain the eyes to stay in focus while your head moves. This helps ease dysequilibrium.   Gait and balance training. This includes standing and walking on different surfaces. The therapist can teach you how to keep your balance and prevent falls.  Doing habituation exercises  The therapist will teach exercises suited to your condition. Habituation exercises will make you dizzy at first. Just remind yourself that symptoms probably will last for only a minute. If you keep doing the exercises, they will help lessen your dizziness. Then when you do a similar movement in daily life, it is less likely to bring on symptoms.  Getting back into action  Dizziness doesn't have to keep you from exercising. Start with activities that don't make you dizzy. Then ease into more challenging activities. Try these tips:   Always exercise with a partner.   Stop if you have nausea or faintness.   Walk on a treadmill, holding on to the handles for support.   Use a ball machine for sports like tennis until you're ready for a live game. This way  you know where to expect the ball.   Don't give up. With time, most people can return to activities and sports.  Date Last Reviewed: 09/02/2015   2000-2016 [employer] [address], [place], [place] [address]. All rights reserved. This information is not intended as a substitute for professional medical care. Always follow your healthcare professional's instructions.

## 2016-07-29 NOTE — Discharge Summary (Signed)
DISCHARGE SUMMARY    Date Time: 07/29/16 9:46 AM  Patient Name: [name]  Attending Physician: Gean Quint, MD    Date of Admission:       07/24/2016    Date of Discharge:       07/29/16    Reason for Admission:     DIZZINESS    Discharge Diagnosis   Dizziness c/w vertigo, MRI brain, MRI neck normal, orthostatics negative, MI ruled out  UTI on cipro  IDA, hct stable  DM2, hgA1c low for age at 5.8% and some low blood sugars in 70s noted  HLP  Hx CVA    Past Medical History:     Past Medical History:   Diagnosis Date   . Cerebrovascular accident     03/2015   . Diabetes mellitus    . Hyperlipidemia    . No diagnosis        Past Surgical History:     Past Surgical History:   Procedure Laterality Date   . APPENDECTOMY     . EGD, BIOPSY N/A 06/30/2015    Procedure: EGD, BIOPSY;  Surgeon: [name], MD;  Location: [facility];  Service: Gastroenterology;  Laterality: N/A;   . EGD, COLONOSCOPY N/A 08/04/2015    Procedure: EGD, COLONOSCOPY;  Surgeon: [name], MD;  Location: [facility];  Service: Gastroenterology;  Laterality: N/A;       Family History:     No family history on file.    Social History:     Social History     Social History   . Marital status: Single     Spouse name: N/A   . Number of children: N/A   . Years of education: N/A     Social History Main Topics   . Smoking status: Current Some Day Smoker     Packs/day: 0.50     Types: Cigarettes   . Smokeless tobacco: Current User      Comment: smokes 2-3 cigaretes wekly   . Alcohol use Yes      Comment: occassionally   . Drug use: No   . Sexual activity: Not on file     Other Topics Concern   . Not on file     Social History Narrative   . No narrative on file       Allergies:     Allergies   Allergen Reactions   . Penicillins        Code Status:        full code    Vaccination given :       There is no immunization history on file for this patient.        Discharge Medications:        Medication List      START taking these medications     ciprofloxacin 250 MG tablet  Commonly known as:  CIPRO  Take 1 tablet (250 mg total) by mouth every 12 (twelve) hours.for 2 days     meclizine 12.5 MG tablet  Commonly known as:  ANTIVERT  Take 1 tablet (12.5 mg total) by mouth 3 (three) times daily as needed for Dizziness.        CONTINUE taking these medications    aspirin 81 MG tablet     brimonidine 0.1 % Soln  Commonly known as:  ALPHAGAN P     iron-vitamin C 65-125 MG Tabs  Commonly known as:  VITRON C  Take 1 tablet by mouth 2 (two)  times daily.     metFORMIN 1000 MG tablet  Commonly known as:  GLUCOPHAGE  Take 1 tablet (1,000 mg total) by mouth every morning with breakfast.     pantoprazole 40 MG tablet  Commonly known as:  PROTONIX  Take 1 tablet (40 mg total) by mouth 2 (two) times daily.     simvastatin 40 MG tablet  Commonly known as:  ZOCOR  Take 1 tablet (40 mg total) by mouth nightly.        STOP taking these medications    pioglitazone 45 MG tablet  Commonly known as:  ACTOS     UNABLE TO FIND           Where to Get Your Medications      You can get these medications from any pharmacy    Bring a paper prescription for each of these medications   ciprofloxacin 250 MG tablet   meclizine 12.5 MG tablet         Consultations:     Treatment Team:   Attending Provider: Gean Quint, MD  Consulting Physician: Ardelle Anton, MD    Physical Exam:     BP 127/59   Pulse 73   Temp 97 F (36.1 C) (Oral)   Resp 17   Ht 1.676 m (5\' 6" )   Wt 75.4 kg (166 lb 3.2 oz)   SpO2 96%   BMI 26.83 kg/m           General: Not in acute distress  HEENT: NC/AT, pupil reacting to light  Neck: supple  Chest: Clear to auscultation  CV: normal S1 and S2  Abdomen: soft, BS+ve, non distended,nontender  Extremities: no edema  Neuro: AAOx3, moving all extremities    Labs:     Recent Labs  Lab 07/27/16  0435 07/25/16  0325 07/24/16  1830   Glucose 138* 141* 121*   BUN 15.0 21.0 19.0   Creatinine 1.0 1.2 1.6*   Calcium 8.5 8.8 8.8   Sodium 140 138 138   Potassium 4.2 4.0  5.0   Chloride 111 108 106   CO2 25 25 23    Phosphorus 2.9  --   --    Magnesium 1.7  --   --        Recent Labs  Lab 07/27/16  0435 07/25/16  0325 07/24/16  1830   WBC 4.30 4.66 5.38   Hgb 8.6* 9.0* 9.9*   Hematocrit 27.5* 28.5* 31.5*   MCV 93.9 91.9 93.2   MCH 29.4 29.0 29.3   MCHC 31.3* 31.6* 31.4*   Platelets 197 189 206         Microbiology Results     Procedure Component Value Units Date/Time    Blood Culture Aerobic/Anaerobic #1 [[id number]] Collected:  07/24/16 1830    Specimen:  Arm from Blood Updated:  07/29/16 0021    Narrative:       ORDER#: [id number]                                    ORDERED BY: LE, LIZA  SOURCE: Blood arm                                    COLLECTED:  07/24/16 18:30  ANTIBIOTICS AT COLL.:  RECEIVED :  07/25/16 00:18  Culture Blood Aerobic and Anaerobic        PRELIM      07/29/16 00:21  07/26/16   No Growth after 1 day/s of incubation.  07/27/16   No Growth after 2 day/s of incubation.  07/28/16   No Growth after 3 day/s of incubation.  07/29/16   No Growth after 4 day/s of incubation.      Blood Culture Aerobic/Anaerobic #2 [[id number]] Collected:  07/24/16 1830    Specimen:  Arm from Blood Updated:  07/29/16 0021    Narrative:       ORDER#: [id number]                                    ORDERED BY: LE, LIZA  SOURCE: Blood arm                                    COLLECTED:  07/24/16 18:30  ANTIBIOTICS AT COLL.:                                RECEIVED :  07/25/16 00:18  Culture Blood Aerobic and Anaerobic        PRELIM      07/29/16 00:21  07/26/16   No Growth after 1 day/s of incubation.  07/27/16   No Growth after 2 day/s of incubation.  07/28/16   No Growth after 3 day/s of incubation.  07/29/16   No Growth after 4 day/s of incubation.                ABGs:  No results found for: ABGCOLLECTIO, ALLENSTEST, PHART, PCO2ART, PO2ART, HCO3ART, BEART, O2SATART    Urinalysis    Recent Labs  Lab 07/24/16  1829   Urine Type Clean Catch   Color, UA Amber*   Clarity, UA  Sl Cloudy*   Specific Gravity UA 1.023   Urine pH 5.0   Nitrite, UA Negative   Ketones UA Trace*   Urobilinogen, UA Negative   Bilirubin, UA Negative   Blood, UA Negative   RBC, UA 6 - 10*   WBC, UA 6 - 10*       RADIOLOGY :  Mri Angiogram Head Wo Contrast    Result Date: 07/26/2016  1. MRA Neck Vessels: No significant stenosis. Antegrade flow in both vertebral arteries. 2. MRA Circle of Willis: Within normal limits. No intracranial high-grade stenosis or large vessel occlusion. Heron Nay, MD 07/26/2016 9:13 AM     Mri Angiogram Neck W Wo Contrast    Result Date: 07/26/2016  1. MRA Neck Vessels: No significant stenosis. Antegrade flow in both vertebral arteries. 2. MRA Circle of Willis: Within normal limits. No intracranial high-grade stenosis or large vessel occlusion. Heron Nay, MD 07/26/2016 9:13 AM     Mri Brain W Wo Contrast    Result Date: 07/25/2016   Mild chronic small vessel ischemic changes. No acute intracranial findings. Lorinda Creed, MD 07/25/2016 2:32 PM     Xr Chest  Ap Portable    Result Date: 07/24/2016   No acute cardiopulmonary findings. Lorinda Creed, MD 07/24/2016 6:41 PM       Echo Results     None  Procedures performed:     No orders of the defined types were placed in this encounter.        Hospital Course:     See daily progress notes. 82 year old male with hx of DM, HLP, CVA admitted for c/o dizziness.  On exam he had nystagmus and responded well to antivert so dx with vertigo.  MRI brain/neck negative. Troponin and orthostatics normal.  Seen by neuro and cleared.  Noted to have UTI and treated with cipro.  Hemoglobin A1c was low for age 38.8%.  Will hold actos on [place] and continue metformin.  rx for cipro and prn antivert given at [place].  Seen by PT, rec home with home PT.  Pt ambulated prior to [place] without any symptoms of dizziness.          Discharge Instructions:     [place] home with services     Follow-up with     [name], MD  [address]  [place]  [address]  [phone]    Follow up in 1 week(s)  hospital follow up      Signed by:  Gean Quint, MD    Routed to dr Selena Batten

## 2016-07-29 NOTE — Discharge Instr - Diet (Signed)
Diet: consistent carbohydrate diet

## 2016-08-07 ENCOUNTER — Emergency Department: Payer: Medicare (Managed Care)

## 2016-08-07 ENCOUNTER — Emergency Department
Admission: EM | Admit: 2016-08-07 | Discharge: 2016-08-07 | Disposition: A | Discharge status: Home / Self Care | Payer: Medicare (Managed Care) | Attending: Emergency Medicine | Admitting: Emergency Medicine

## 2016-08-07 DIAGNOSIS — R42 Dizziness and giddiness: Secondary | ICD-10-CM | POA: Insufficient documentation

## 2016-08-07 DIAGNOSIS — Z7984 Long term (current) use of oral hypoglycemic drugs: Secondary | ICD-10-CM | POA: Insufficient documentation

## 2016-08-07 DIAGNOSIS — E785 Hyperlipidemia, unspecified: Secondary | ICD-10-CM | POA: Insufficient documentation

## 2016-08-07 DIAGNOSIS — F17210 Nicotine dependence, cigarettes, uncomplicated: Secondary | ICD-10-CM | POA: Insufficient documentation

## 2016-08-07 DIAGNOSIS — E119 Type 2 diabetes mellitus without complications: Secondary | ICD-10-CM | POA: Insufficient documentation

## 2016-08-07 DIAGNOSIS — Z7982 Long term (current) use of aspirin: Secondary | ICD-10-CM | POA: Insufficient documentation

## 2016-08-07 DIAGNOSIS — Z8673 Personal history of transient ischemic attack (TIA), and cerebral infarction without residual deficits: Secondary | ICD-10-CM | POA: Insufficient documentation

## 2016-08-07 LAB — CBC AND DIFFERENTIAL
Absolute NRBC: 0 10*3/uL
Basophils Absolute Automated: 0.02 10*3/uL (ref 0.00–0.20)
Basophils Automated: 0.4 %
Eosinophils Absolute Automated: 0.15 10*3/uL (ref 0.00–0.70)
Eosinophils Automated: 3.3 %
Hematocrit: 28.7 % — ABNORMAL LOW (ref 42.0–52.0)
Hgb: 9 g/dL — ABNORMAL LOW (ref 13.0–17.0)
Immature Granulocytes Absolute: 0 10*3/uL
Immature Granulocytes: 0 %
Lymphocytes Absolute Automated: 1.14 10*3/uL (ref 0.50–4.40)
Lymphocytes Automated: 24.8 %
MCH: 30.1 pg (ref 28.0–32.0)
MCHC: 31.4 g/dL — ABNORMAL LOW (ref 32.0–36.0)
MCV: 96 fL (ref 80.0–100.0)
MPV: 10.9 fL (ref 9.4–12.3)
Monocytes Absolute Automated: 0.32 10*3/uL (ref 0.00–1.20)
Monocytes: 7 %
Neutrophils Absolute: 2.97 10*3/uL (ref 1.80–8.10)
Neutrophils: 64.5 %
Nucleated RBC: 0 /100 WBC (ref 0.0–1.0)
Platelets: 232 10*3/uL (ref 140–400)
RBC: 2.99 10*6/uL — ABNORMAL LOW (ref 4.70–6.00)
RDW: 18 % — ABNORMAL HIGH (ref 12–15)
WBC: 4.6 10*3/uL (ref 3.50–10.80)

## 2016-08-07 LAB — COMPREHENSIVE METABOLIC PANEL
ALT: 13 U/L (ref 0–55)
AST (SGOT): 21 U/L (ref 5–34)
Albumin/Globulin Ratio: 0.8 — ABNORMAL LOW (ref 0.9–2.2)
Albumin: 2.9 g/dL — ABNORMAL LOW (ref 3.5–5.0)
Alkaline Phosphatase: 58 U/L (ref 38–106)
Anion Gap: 8 (ref 5.0–15.0)
BUN: 26 mg/dL (ref 9.0–28.0)
Bilirubin, Total: 0.2 mg/dL (ref 0.2–1.2)
CO2: 21 mEq/L — ABNORMAL LOW (ref 22–29)
Calcium: 8.4 mg/dL (ref 7.9–10.2)
Chloride: 109 mEq/L (ref 100–111)
Creatinine: 1.3 mg/dL (ref 0.7–1.3)
Globulin: 3.5 g/dL (ref 2.0–3.6)
Glucose: 215 mg/dL — ABNORMAL HIGH (ref 70–100)
Potassium: 5.2 mEq/L — ABNORMAL HIGH (ref 3.5–5.1)
Protein, Total: 6.4 g/dL (ref 6.0–8.3)
Sodium: 138 mEq/L (ref 136–145)

## 2016-08-07 LAB — URINALYSIS, REFLEX TO MICROSCOPIC EXAM IF INDICATED
Bilirubin, UA: NEGATIVE
Blood, UA: NEGATIVE
Glucose, UA: 500 — AB
Ketones UA: NEGATIVE
Leukocyte Esterase, UA: NEGATIVE
Nitrite, UA: NEGATIVE
Protein, UR: NEGATIVE
Specific Gravity UA: 1.016 (ref 1.001–1.035)
Urine pH: 6 (ref 5.0–8.0)
Urobilinogen, UA: NEGATIVE mg/dL

## 2016-08-07 LAB — GFR: EGFR: >60

## 2016-08-07 LAB — TROPONIN I: Troponin I: <0.01 ng/mL (ref 0.00–0.09)

## 2016-08-07 LAB — HEMOLYSIS INDEX: Hemolysis Index: 83 — ABNORMAL HIGH (ref 0–18)

## 2016-08-07 MED ORDER — MECLIZINE HCL 12.5 MG PO TABS
25.0000 mg | ORAL_TABLET | Freq: Once | ORAL | Status: AC
Start: 2016-08-07 — End: 2016-08-07
  Administered 2016-08-07: 25 mg via ORAL
  Filled 2016-08-07: qty 2

## 2016-08-07 NOTE — ED Notes (Signed)
Pt ambulated with RN standby assist. Ambulated well with steady gait, denies dizziness.

## 2016-08-07 NOTE — Discharge Instructions (Signed)
Dizziness, Nonspecific     You have been seen for dizziness.      Dizziness can mean different things to different people. Some people use dizziness to mean the feeling of spinning when there is no actual movement. This often causes nausea (feeling sick). The medical term for this is “vertigo.” Others people use the word dizzy to mean “feeling lightheaded,” like you might faint. This feeling is usually made better when lying down. For some people, neither of these describes how they are feeling. It can just be a feeling that makes you unsteady. This feeling is common in older people. It can be caused by a number of things. These include poor vision or hearing, foot problems and arthritis. It can also be caused by middle ear or sinus problems. The feeling can come and go.     Dizziness is also caused by more serious things. This includes strokes and heart problems.     It is NEVER normal to have the kind of dizziness you have today together with:  · Chest pain.  · Problems walking because of problems with balance. Especially if you are falling to one side.  · Weakness, numbness or tingling in a part of your body.  · Drooping of one side of your face.  · Confusion.  · Severe headache.  · Problems speaking.     If you have these symptoms, it is VERY IMPORTANT to go to the nearest emergency department.     Your tests today were negative (normal). This means we found no life-threatening causes for your dizziness. It is OK for you to go home.     See your primary care doctor for more work-up of your dizziness.      YOU SHOULD SEEK MEDICAL ATTENTION IMMEDIATELY, EITHER HERE OR AT THE NEAREST EMERGENCY DEPARTMENT, IF ANY OF THE FOLLOWING OCCUR:  · You cannot speak clearly (slurring), one side of your face droops or you feel weak in the arms or legs (especially on one side).  · You have problems with your balance.  · You have problems hearing or there is ringing or a feeling of fullness in your ear.  · You lose consciousness  (“pass out” or faint).  · You have severe headache with dizziness.  · You have fever (temperature higher than 100.4ºF / 38ºC).  · You fall and hit your head.       Vertigo     You have been diagnosed with vertigo.     Vertigo means "the feeling of spinning." People with vertigo have an intense feeling that the room is spinning. This is often called “dizziness.”     Most of the time the cause of vertigo is not serious. The most common cause is a balance problem in the inner ear.     The usual treatment is medication to help relieve the spinning feeling and to control nausea.     DO NOT drive a motor vehicle or operate any other equipment that requires concentration until your symptoms have resolved. Be very careful going up and down stairs.     If your symptoms continue your doctor may order an MRI of your brain to make sure there is not a more serious cause of your vertigo.     YOU SHOULD SEEK MEDICAL ATTENTION IMMEDIATELY, EITHER HERE OR AT THE NEAREST EMERGENCY DEPARTMENT, IF ANY OF THE FOLLOWING OCCURS:  · You feel numbness, tingling, or weakness in your arms or legs or become unable to walk.  · Your symptoms become worse, even with medication.  · You have a   severe headache.  · You have vomiting that makes it hard to take or keep down medication.

## 2016-08-07 NOTE — ED Notes (Signed)
Bed: GR7  Expected date:   Expected time:   Means of arrival: [id number] [place]  Comments:  M206 DIZZY

## 2016-08-07 NOTE — ED Provider Notes (Signed)
EMERGENCY DEPARTMENT HISTORY AND PHYSICAL EXAM     Physician/Midlevel provider first contact with patient: 08/07/16 2029         Date: 08/07/2016  Patient Name: [name]    History of Presenting Illness     Chief Complaint   Patient presents with   . Dizziness       History Provided By: Patient    Chief Complaint: dizziness  Onset: 3 days ago  Timing: intermittent  Location: neuro  Quality: unsteadiness  Severity: moderate  Exacerbating factors: none  Alleviating factors: lying supine  Associated Symptoms: none  Pertinent Negatives: fever, vomiting, diarrhea, rhinorrhea, congestion    Additional History: [name] is a 82 y.o. male presenting to the ED with dizziness for the past 3 days. His symptoms are intermittent in nature. He has not fallen. He does not have known history of HTN or CAD.       PCP: [name], MD  SPECIALISTS:    No current facility-administered medications for this encounter.      Current Outpatient Prescriptions   Medication Sig Dispense Refill   . aspirin 81 MG tablet Take 81 mg by mouth daily.     . brimonidine (ALPHAGAN P) 0.1 % Solution Place 1 drop into both eyes 2 (two) times daily.        . iron-vitamin C (VITRON C) 65-125 MG Tab Take 1 tablet by mouth 2 (two) times daily. 60 tablet 0   . meclizine (ANTIVERT) 12.5 MG tablet Take 1 tablet (12.5 mg total) by mouth 3 (three) times daily as needed for Dizziness. 20 tablet 0   . metFORMIN (GLUCOPHAGE) 1000 MG tablet Take 1 tablet (1,000 mg total) by mouth every morning with breakfast.     . pantoprazole (PROTONIX) 40 MG tablet Take 1 tablet (40 mg total) by mouth 2 (two) times daily. 60 tablet 0   . simvastatin (ZOCOR) 40 MG tablet Take 1 tablet (40 mg total) by mouth nightly. 30 tablet 0       Past History     Past Medical History:  Past Medical History:   Diagnosis Date   . Cerebrovascular accident     03/2015   . Diabetes mellitus    . Hyperlipidemia    . No diagnosis        Past Surgical History:  Past Surgical History:    Procedure Laterality Date   . APPENDECTOMY     . EGD, BIOPSY N/A 06/30/2015    Procedure: EGD, BIOPSY;  Surgeon: [name], MD;  Location: [facility];  Service: Gastroenterology;  Laterality: N/A;   . EGD, COLONOSCOPY N/A 08/04/2015    Procedure: EGD, COLONOSCOPY;  Surgeon: [name], MD;  Location: [facility];  Service: Gastroenterology;  Laterality: N/A;       Family History:  History reviewed. No pertinent family history.    Social History:  Social History   Substance Use Topics   . Smoking status: Current Some Day Smoker     Packs/day: 0.50     Types: Cigarettes   . Smokeless tobacco: Current User      Comment: smokes 2-3 cigaretes wekly   . Alcohol use Yes      Comment: occassionally       Allergies:  Allergies   Allergen Reactions   . Penicillins        Review of Systems     Review of Systems    Constitutional: Negative for fever or chills.   Neurological:  Negative for speech changes, weakness, or numbness. Positive for dizziness.   Eyes: Negative for visual changes or eye pain.  HENT: No headache. Negative for sore throat, neck pain, or runny nose.   Cardiovascular: Negative for chest pain.   Respiratory: Negative for shortness of breath.   Gastrointestinal: Negative for abdominal pain, nausea, vomiting, diarrhea, or blood in stool.   Genitourinary: Negative for dysuria or hematuria.  Musculoskeletal: Negative for gait changes, joint pain or muscle pain.   Skin: Negative for itching or rash.   Hematological: Negative for easy bruising      Physical Exam   BP 123/60   Pulse 68   Temp 97.9 F (36.6 C) (Oral)   Resp 18   SpO2 100%     Physical Exam   Constitutional: Oriented to person, place, and time and well-developed, well-nourished, and in no distress.   Head: Normocephalic and atraumatic.   Mouth/Throat: Oropharynx is clear and moist.   Eyes: Conjunctivae normal and EOM are normal. Pupils are equal, round, and reactive to light.    Neck: Normal range of motion. Neck supple.   Cardiovascular:  Distant heart sounds. Systolic murmur radiating to carotids.   Pulmonary/Chest: Effort normal and breath sounds normal.   Abdominal: Soft. Non distended. Non tender. No rebound or guarding  Musculoskeletal: No peripheral edema. No calf swelling or tenderness.  Cool feet. Normal pedal pulses. Normal capillary refill.  Skin: Skin is warm and dry. No rash  Psychiatric: Affect normal.   Neurological: Patient is alert and oriented to person, place, and time. No cranial nerve deficit. Gait normal. GCS score is 15. 5/5 muscle strength in bilateral upper and lower extremities. No pronator drift. Normal finger to nose. No sensory deficits      Diagnostic Study Results     Labs -     Results     Procedure Component Value Units Date/Time    UA, Reflex to Microscopic [[id number]]  (Abnormal) Collected:  08/07/16 2251    Specimen:  Urine Updated:  08/07/16 2310     Urine Type Clean Catch     Color, UA Yellow     Clarity, UA Clear     Specific Gravity UA 1.016     Urine pH 6.0     Leukocyte Esterase, UA Negative     Nitrite, UA Negative     Protein, UR Negative     Glucose, UA 500 (A)     Ketones UA Negative     Urobilinogen, UA Negative mg/dL      Bilirubin, UA Negative     Blood, UA Negative     RBC, UA 0 - 2 /hpf      WBC, UA 0 - 5 /hpf      Hyaline Casts, UA 6 - 10 (A) /lpf     Troponin I [[id number]] Collected:  08/07/16 2039    Specimen:  Blood Updated:  08/07/16 2125     Troponin I <0.01 ng/mL     Comprehensive metabolic panel [[id number]]  (Abnormal) Collected:  08/07/16 2039    Specimen:  Blood Updated:  08/07/16 2117     Glucose 215 (H) mg/dL      BUN 93.2 mg/dL      Creatinine 1.3 mg/dL      Sodium 355 mEq/L      Potassium 5.2 (H) mEq/L      Chloride 109 mEq/L      CO2 21 (L) mEq/L      Calcium 8.4 mg/dL  Protein, Total 6.4 g/dL      Albumin 2.9 (L) g/dL      AST (SGOT) 21 U/L      ALT 13 U/L      Alkaline Phosphatase 58 U/L      Bilirubin, Total 0.2 mg/dL      Globulin 3.5 g/dL      Albumin/Globulin Ratio 0.8 (L)      Anion Gap 8.0    Hemolysis index [[id number]]  (Abnormal) Collected:  08/07/16 2039     Updated:  08/07/16 2117     Hemolysis Index 83 (H)    GFR [[id number]] Collected:  08/07/16 2039     Updated:  08/07/16 2117     EGFR >60.0    CBC with differential [[id number]]  (Abnormal) Collected:  08/07/16 2039    Specimen:  Blood from Blood Updated:  08/07/16 2059     WBC 4.60 x10 3/uL      Hgb 9.0 (L) g/dL      Hematocrit 08.6 (L) %      Platelets 232 x10 3/uL      RBC 2.99 (L) x10 6/uL      MCV 96.0 fL      MCH 30.1 pg      MCHC 31.4 (L) g/dL      RDW 18 (H) %      MPV 10.9 fL      Neutrophils 64.5 %      Lymphocytes Automated 24.8 %      Monocytes 7.0 %      Eosinophils Automated 3.3 %      Basophils Automated 0.4 %      Immature Granulocyte 0.0 %      Nucleated RBC 0.0 /100 WBC      Neutrophils Absolute 2.97 x10 3/uL      Abs Lymph Automated 1.14 x10 3/uL      Abs Mono Automated 0.32 x10 3/uL      Abs Eos Automated 0.15 x10 3/uL      Absolute Baso Automated 0.02 x10 3/uL      Absolute Immature Granulocyte 0.00 x10 3/uL      Absolute NRBC 0.00 x10 3/uL           Radiologic Studies -   Radiology Results (24 Hour)     Procedure Component Value Units Date/Time    Chest AP Portable [[id number]] Collected:  08/07/16 2104    Order Status:  Completed Updated:  08/07/16 2108    Narrative:       INDICATION: Acute chest pain    TECHNIQUE: An AP portable radiograph of the chest was obtained.     COMPARISON: 07/24/2016    FINDINGS:  The cardiac silhouette is within normal limits.  The lungs  are clear of acute disease.  There is no evidence of pleural effusion or  pneumothorax.      Impression:         No evidence of acute process.    Aquilla Hacker, MD   08/07/2016 9:04 PM        .    Medical Decision Making   I am the first provider for this patient.    I reviewed the vital signs, available nursing notes, past medical history, past surgical history, family history and social history.    Vital Signs-Reviewed the patient's vital signs.      Patient Vitals for the past 12 hrs:   BP Temp Pulse Resp   08/07/16 2251 123/60 - 68 -  08/07/16 2200 118/57 97.9 F (36.6 C) 74 18   08/07/16 2007 99/44 98.1 F (36.7 C) 74 16       Pulse Oximetry Analysis - Normal 100% on RA    EKG:  Interpreted by the EP.   Time Interpreted: 2035   Rate: 68 bpm   Rhythm: SR with 1st degree AV block   Interpretation: Normal QRS. No ectopy. No STEMI.    Comparison: No prior study is available for comparison.          Old Medical Records: Nursing notes.     ED Course:     2201. Ordered Antivert. Will await ED road testing after observation period.     2259. Patient is able to ambulate with steady gait.     2323. Re-evaluated patient, who is resting in ED in no acute distress. The patient is stable for discharge, counseling is provided as documented, discussed symptomatic treatment and specific conditions for return. ED results were reviewed with patient. Explained all discharge instructions, which were understood. All questions were answered from patient.       Provider Notes:  39 YOM with dizziness x 4 days. Pt has been recently admitted for dizziness with recent brain imaging. Normal neuro exam. Pt given IVF bolus and meclizine in ED with sig sx improvement. Labs unremarkable. EKG unchanged. No readmission at this time. Pt safe for d/c home. Follow up with PCP.           Diagnosis     Clinical Impression:   1. Dizziness    2. Vertigo        Treatment Plan:   ED Disposition     ED Disposition Condition Date/Time Comment    Discharge  Sat Aug 07, 2016 11:24 PM [name] discharge to home/self care.    Condition at disposition: Stable            _______________________________      Attestations: This note is prepared by Leonia Reader, acting as scribe for Dr. Audley Hose, MD.    Dr. Audley Hose, MD - The scribe's documentation has been prepared under my direction and personally reviewed by me in its entirety.  I confirm that the note above accurately reflects all work,  treatment, procedures, and medical decision making performed by me.    _______________________________       Cherlyn Roberts, MD  08/08/16 2122

## 2016-08-07 NOTE — ED Triage Notes (Signed)
[name] is a 82 y.o. male, BIBA for dizziness x 4 days. Pt has hx of DM and high cholesterol. Dexi 275. Denies LOC or CP at this time. A/O x 3.

## 2016-08-08 LAB — ECG 12-LEAD
Atrial Rate: 68 {beats}/min
P Axis: 50 degrees
P-R Interval: 210 ms
Q-T Interval: 398 ms
QRS Duration: 90 ms
QTC Calculation (Bezet): 423 ms
R Axis: 46 degrees
T Axis: 37 degrees
Ventricular Rate: 68 {beats}/min

## 2017-04-23 ENCOUNTER — Emergency Department
Admission: EM | Admit: 2017-04-23 | Discharge: 2017-04-23 | Disposition: A | Discharge status: Home / Self Care | Payer: 59 | Attending: Emergency Medicine | Admitting: Emergency Medicine

## 2017-04-23 DIAGNOSIS — E119 Type 2 diabetes mellitus without complications: Secondary | ICD-10-CM | POA: Insufficient documentation

## 2017-04-23 DIAGNOSIS — E785 Hyperlipidemia, unspecified: Secondary | ICD-10-CM | POA: Insufficient documentation

## 2017-04-23 DIAGNOSIS — K0381 Cracked tooth: Secondary | ICD-10-CM | POA: Insufficient documentation

## 2017-04-23 DIAGNOSIS — K0889 Other specified disorders of teeth and supporting structures: Secondary | ICD-10-CM

## 2017-04-23 DIAGNOSIS — Z7984 Long term (current) use of oral hypoglycemic drugs: Secondary | ICD-10-CM | POA: Insufficient documentation

## 2017-04-23 DIAGNOSIS — F17210 Nicotine dependence, cigarettes, uncomplicated: Secondary | ICD-10-CM | POA: Insufficient documentation

## 2017-04-23 DIAGNOSIS — Z8673 Personal history of transient ischemic attack (TIA), and cerebral infarction without residual deficits: Secondary | ICD-10-CM | POA: Insufficient documentation

## 2017-04-23 LAB — GLUCOSE WHOLE BLOOD - POCT: Whole Blood Glucose POCT: 192 mg/dL — ABNORMAL HIGH (ref 70–100)

## 2017-04-23 MED ORDER — IBUPROFEN 600 MG PO TABS
600.0000 mg | ORAL_TABLET | Freq: Once | ORAL | Status: AC
Start: 2017-04-23 — End: 2017-04-23
  Administered 2017-04-23: 600 mg via ORAL
  Filled 2017-04-23: qty 1

## 2017-04-23 MED ORDER — ACETAMINOPHEN 325 MG PO TABS
650.0000 mg | ORAL_TABLET | ORAL | 0 refills | Status: AC | PRN
Start: 2017-04-23 — End: ?

## 2017-04-23 MED ORDER — ACETAMINOPHEN 325 MG PO TABS
650.0000 mg | ORAL_TABLET | Freq: Once | ORAL | Status: AC
Start: 2017-04-23 — End: 2017-04-23
  Administered 2017-04-23: 650 mg via ORAL
  Filled 2017-04-23: qty 2

## 2017-04-23 NOTE — Discharge Instructions (Signed)
Toothache    You have been seen for a toothache.    A toothache happens when the nerve of the tooth gets irritated. Infection, trauma, decay or cavities may cause this irritation. There may be pain after a tooth is lost (from trauma or being pulled).    Symptoms may include:   Pain with chewing.   Sensitivity to hot and cold.   The gums may get beefy red or inflamed in color.    Treatment depends on the toothache s cause. Follow up with a dentist right away for cavities and chipped teeth. Also see a dentist right away for post-extraction (after pulling) pain. Non-steroidal anti-inflammatory medicines (ibuprofen (Advil or Motrin); naproxen (Aleve, Naprosyn), etc.) can usually treat dental pain. Often, simple toothaches do not need narcotic pain medicines.    Emergency and Urgent Care Doctors are not Dentists. Urgent Care and Emergency Department treatment IS NO SUBSTITUTE for treatment by a licensed dentist. Follow up with a dentist right away and plan to see your dentist on a regular schedule.    YOU SHOULD SEEK MEDICAL ATTENTION IMMEDIATELY, EITHER HERE OR AT THE NEAREST EMERGENCY DEPARTMENT, IF ANY OF THE FOLLOWING OCCURS:   Swelling of the face, neck, and cheeks.   High fever (temperature higher than 100.4F / 38C), chills, vomiting, signs of dehydration.   You can't swallow your saliva (spit) or medicine.          Clinics: Dental    DENTAL RESOURCES      [place]    [facility] - [phone]    [facility] - [phone]  [address], [place] [address]    [facility] - [phone]  basic dental services, no restorative services available    [facility] - [phone]  requires screening by social service agency/income requirement      [place]    [facility] - [phone]  [place] [facility] ([address]  annual fee $35.00 for adults and $15.00 for children  under age of 16    [facility] - [phone]  [facility] - [phone]  [facility] - [phone]  [facility] [facility] - [phone]      [place]  [facility]  [place] - [phone]  [place] - [phone]      OTHER RESOURCES  [facility] [place]  [phone]    [facility]  [phone]

## 2017-04-23 NOTE — ED Triage Notes (Signed)
Pt c/o toothache that started yesterday on the right upper side. Pt has not taken any pain medication today. Pt denies having any recent dental work done and states it started out of nowhere.

## 2017-04-23 NOTE — ED Provider Notes (Signed)
EMERGENCY DEPARTMENT HISTORY AND PHYSICAL EXAM     Physician/Midlevel provider first contact with patient: 04/23/17 1758         Date: 04/23/2017  Patient Name: [name]    History of Presenting Illness     Chief Complaint   Patient presents with   . Dental Pain       History Provided By: Patient    Chief Complaint: Dental pain   Duration: Three days ago  Timing:  Constant  Location: Right upper  Severity: Moderate  Exacerbating factors: None  Alleviating factors: None  Associated Symptoms: None    Additional History: [name] is a 83 y.o. male with a hx of DM presenting to the ED complaining of constant right upper dental pain, which began three days ago. Pt states he does not have a dentist he sees regularly and has not taken any medication for the pain. He has no other complaints at this time.       PCP: [name], MD  SPECIALISTS:    No current facility-administered medications for this encounter.      Current Outpatient Prescriptions   Medication Sig Dispense Refill   . acetaminophen (TYLENOL) 325 MG tablet Take 2 tablets (650 mg total) by mouth every 4 (four) hours as needed for Pain.  0   . metFORMIN (GLUCOPHAGE) 1000 MG tablet Take 1 tablet (1,000 mg total) by mouth every morning with breakfast.         Past History     Past Medical History:  Past Medical History:   Diagnosis Date   . Cerebrovascular accident     03/2015   . Diabetes mellitus    . Hyperlipidemia    . No diagnosis        Past Surgical History:  Past Surgical History:   Procedure Laterality Date   . APPENDECTOMY     . EGD, BIOPSY N/A 06/30/2015    Procedure: EGD, BIOPSY;  Surgeon: [name], MD;  Location: [facility];  Service: Gastroenterology;  Laterality: N/A;   . EGD, COLONOSCOPY N/A 08/04/2015    Procedure: EGD, COLONOSCOPY;  Surgeon: [name], MD;  Location: [facility];  Service: Gastroenterology;  Laterality: N/A;       Family History:  History reviewed. No pertinent family history.    Social History:  Social  History   Substance Use Topics   . Smoking status: Current Some Day Smoker     Packs/day: 0.50     Types: Cigarettes   . Smokeless tobacco: Current User      Comment: smokes 2-3 cigaretes wekly   . Alcohol use Yes      Comment: occassionally       Allergies:  No Known Allergies    Review of Systems     Review of Systems   Constitutional: Negative for fever.   HENT: Positive for dental problem.    Eyes: Negative for redness.   Respiratory: Negative for cough.    Cardiovascular: Negative for leg swelling.   Gastrointestinal: Negative for diarrhea.   Genitourinary: Negative for dysuria.   Musculoskeletal: Negative for neck pain.   Skin: Negative for rash.   Neurological: Negative for syncope.   Psychiatric/Behavioral: Negative for confusion.         Physical Exam   BP 113/58   Pulse 78   Temp 97.7 F (36.5 C)   Resp 18   Ht 5\' 11"  (1.803 m)   Wt 70.3 kg   SpO2 99%  BMI 21.62 kg/m     Physical Exam   Constitutional: He is oriented to person, place, and time and well-developed, well-nourished, and in no distress.   HENT:   Head: Normocephalic and atraumatic.   Nose: Nose normal.   Mouth/Throat: Oropharynx is clear and moist.   First molar on upper right has a crack on the inner surface, tender to percussion, no gum line swelling   Eyes: Conjunctivae are normal. Right eye exhibits no discharge. Left eye exhibits no discharge.   Neck: Normal range of motion. Neck supple.   Cardiovascular: Normal rate, regular rhythm, normal heart sounds and intact distal pulses.  Exam reveals no gallop and no friction rub.    No murmur heard.  Pulmonary/Chest: Effort normal and breath sounds normal. No respiratory distress. He has no wheezes. He has no rales. He exhibits no tenderness.   Abdominal: Soft. He exhibits no distension and no mass. There is no tenderness. There is no rebound and no guarding.   Musculoskeletal: Normal range of motion. He exhibits no edema or tenderness.   Lymphadenopathy:     He has no cervical  adenopathy.   Neurological: He is alert and oriented to person, place, and time. GCS score is 15.   Skin: Skin is warm and dry. No rash noted. He is not diaphoretic. No erythema.   Psychiatric: Memory and affect normal.   Nursing note and vitals reviewed.      Diagnostic Study Results     Labs -     Results     Procedure Component Value Units Date/Time    Glucose Whole Blood - POCT [[id number]]  (Abnormal) Collected:  04/23/17 1731     Updated:  04/23/17 1735     POCT - Glucose Whole blood 192 (H) mg/dL           Radiologic Studies -   Radiology Results (24 Hour)     ** No results found for the last 24 hours. **      .    Medical Decision Making   I am the first provider for this patient.    I reviewed the vital signs, available nursing notes, past medical history, past surgical history, family history and social history.    Vital Signs-Reviewed the patient's vital signs.     Patient Vitals for the past 12 hrs:   BP Temp Pulse Resp   04/23/17 1829 113/58 97.7 F (36.5 C) 78 18   04/23/17 1659 104/55 98.1 F (36.7 C) 94 18       Pulse Oximetry Analysis - Normal 98% on RA    Procedures:  --------------------------- PROCEDURE: DENTAL CEMENT APPLICATION  -------------------------    Performed by the emergency provider  Time: 18:12  Consent:  Informed consent, after discussion of the risks, benefits, and alternatives to the procedure was obtained.    Timeout: A timeout to verify the correct patient, procedure, and site was performed.   Procedure Site: First molar on upper right  Indication: Dental pain   Preparation: Area was dried.  Procedure:  Small amount of dental cement mixed and applied to cracked surface. Area was allowed to dry.   Post-procedure: The patient tolerated the procedure well with no immediate complications.      Old Medical Records: Nursing notes.     ED Course:   6:25 PM - Pt resting comfortably, in no acute distress. Pt counseled on diagnosis, f/u plans with dentist, medication use, and signs and  symptoms when to return to  ED.  Pt is stable and ready for discharge.       Provider Notes:   83yo male with dental pain. He has cracked molar. Dental cement applied for some protection. No signs of infection. He will follow up with dentist. [place] home. Return precautions given. Follow up directed.      Diagnosis     Clinical Impression:   1. Pain, dental    2. Cracked tooth        Treatment Plan:   ED Disposition     ED Disposition Condition Date/Time Comment    Discharge  Sat Apr 23, 2017  6:25 PM [name] discharge to home/self care.    Condition at disposition: Stable            _______________________________      Attestations: This note is prepared by Ree Edman, acting as scribe for Rachel Bo, MD.    Rachel Bo, MD - The scribe's documentation has been prepared under my direction and personally reviewed by me in its entirety.  I confirm that the note above accurately reflects all work, treatment, procedures, and medical decision making performed by me.    _______________________________

## 2017-11-30 ENCOUNTER — Other Ambulatory Visit: Payer: Self-pay | Admitting: Internal Medicine

## 2017-11-30 ENCOUNTER — Ambulatory Visit
Admission: RE | Admit: 2017-11-30 | Discharge: 2017-11-30 | Disposition: A | Discharge status: Home / Self Care | Payer: 59 | Source: Ambulatory Visit | Attending: Internal Medicine | Admitting: Internal Medicine

## 2017-11-30 DIAGNOSIS — M7732 Calcaneal spur, left foot: Secondary | ICD-10-CM | POA: Insufficient documentation

## 2017-11-30 LAB — COMPREHENSIVE METABOLIC PANEL
ALT: 12 U/L (ref 0–55)
AST (SGOT): 18 U/L (ref 5–34)
Albumin/Globulin Ratio: 1 (ref 0.9–2.2)
Albumin: 3.5 g/dL (ref 3.5–5.0)
Alkaline Phosphatase: 92 U/L (ref 38–106)
BUN: 19 mg/dL (ref 9.0–28.0)
Bilirubin, Total: 0.5 mg/dL (ref 0.2–1.2)
CO2: 25 mEq/L (ref 21–29)
Calcium: 9.2 mg/dL (ref 7.9–10.2)
Chloride: 106 mEq/L (ref 100–111)
Creatinine: 1.4 mg/dL (ref 0.5–1.5)
Globulin: 3.6 g/dL (ref 2.0–3.7)
Glucose: 161 mg/dL — ABNORMAL HIGH (ref 70–100)
Potassium: 5.1 mEq/L (ref 3.5–5.1)
Protein, Total: 7.1 g/dL (ref 6.0–8.3)
Sodium: 139 mEq/L (ref 136–145)

## 2017-11-30 LAB — URINALYSIS, REFLEX TO MICROSCOPIC EXAM IF INDICATED
Blood, UA: NEGATIVE
Glucose, UA: NEGATIVE
Nitrite, UA: NEGATIVE
Protein, UR: 30 — AB
Specific Gravity UA: 1.023 (ref 1.001–1.035)
Urine pH: 6 (ref 5.0–8.0)
Urobilinogen, UA: 0.2

## 2017-11-30 LAB — CBC AND DIFFERENTIAL
Absolute NRBC: 0 10*3/uL
Basophils Absolute Automated: 0.03 10*3/uL (ref 0.00–0.20)
Basophils Automated: 0.4 %
Eosinophils Absolute Automated: 0.19 10*3/uL (ref 0.00–0.70)
Eosinophils Automated: 2.4 %
Hematocrit: 38.6 % — ABNORMAL LOW (ref 42.0–52.0)
Hgb: 11.6 g/dL — ABNORMAL LOW (ref 13.0–17.0)
Immature Granulocytes Absolute: 0.02 10*3/uL
Immature Granulocytes: 0.3 %
Lymphocytes Absolute Automated: 1.09 10*3/uL (ref 0.50–4.40)
Lymphocytes Automated: 13.7 %
MCH: 27.6 pg — ABNORMAL LOW (ref 28.0–32.0)
MCHC: 30.1 g/dL — ABNORMAL LOW (ref 32.0–36.0)
MCV: 91.9 fL (ref 80.0–100.0)
MPV: 11.3 fL (ref 9.4–12.3)
Monocytes Absolute Automated: 0.46 10*3/uL (ref 0.00–1.20)
Monocytes: 5.8 %
Neutrophils Absolute: 6.19 10*3/uL (ref 1.80–8.10)
Neutrophils: 77.4 %
Nucleated RBC: 0 /100 WBC (ref 0.0–1.0)
Platelets: 248 10*3/uL (ref 140–400)
RBC: 4.2 10*6/uL — ABNORMAL LOW (ref 4.70–6.00)
RDW: 16 % — ABNORMAL HIGH (ref 12–15)
WBC: 7.98 10*3/uL (ref 3.50–10.80)

## 2017-11-30 LAB — HEMOGLOBIN A1C
Average Estimated Glucose: 171.4 mg/dL
Hemoglobin A1C: 7.6 % — ABNORMAL HIGH (ref 4.6–5.9)

## 2017-11-30 LAB — LIPID PANEL
Cholesterol / HDL Ratio: 3.8
Cholesterol: 146 mg/dL (ref 0–199)
HDL: 38 mg/dL — ABNORMAL LOW (ref 40–9999)
LDL Calculated: 91 mg/dL (ref 0–99)
Triglycerides: 85 mg/dL (ref 34–149)
VLDL Calculated: 17 mg/dL (ref 10–40)

## 2017-11-30 LAB — MICROALBUMIN, RANDOM URINE
Urine Creatinine, Random: 336.4 mg/dL
Urine Microalbumin, Random: 63 — ABNORMAL HIGH (ref 0.0–30.0)
Urine Microalbumin/Creatinine Ratio: 19 ug/mg (ref 0–30)

## 2017-11-30 LAB — FERRITIN: Ferritin: 14.15 ng/mL — ABNORMAL LOW (ref 21.80–274.70)

## 2017-11-30 LAB — TSH: TSH: 1.31 u[IU]/mL (ref 0.35–4.94)

## 2017-11-30 LAB — HEMOLYSIS INDEX: Hemolysis Index: 10 (ref 0–18)

## 2017-11-30 LAB — GFR: EGFR: 58.5

## 2017-11-30 LAB — VITAMIN B12: Vitamin B-12: 418 pg/mL (ref 211–911)

## 2017-11-30 LAB — URIC ACID: Uric acid: 5.6 mg/dL (ref 3.6–9.7)

## 2017-11-30 LAB — PSA: Prostate Specific Antigen, Total: 0.938 ng/mL (ref 0.000–4.000)

## 2018-06-19 ENCOUNTER — Ambulatory Visit
Admission: RE | Admit: 2018-06-19 | Discharge: 2018-06-19 | Disposition: A | Discharge status: Home / Self Care | Payer: 59 | Source: Ambulatory Visit | Attending: Nurse Practitioner | Admitting: Nurse Practitioner

## 2018-06-19 DIAGNOSIS — R109 Unspecified abdominal pain: Secondary | ICD-10-CM | POA: Insufficient documentation

## 2018-06-19 DIAGNOSIS — D519 Vitamin B12 deficiency anemia, unspecified: Secondary | ICD-10-CM | POA: Insufficient documentation

## 2018-06-19 DIAGNOSIS — E039 Hypothyroidism, unspecified: Secondary | ICD-10-CM | POA: Insufficient documentation

## 2018-06-19 DIAGNOSIS — R630 Anorexia: Secondary | ICD-10-CM | POA: Insufficient documentation

## 2018-06-19 DIAGNOSIS — G609 Hereditary and idiopathic neuropathy, unspecified: Secondary | ICD-10-CM | POA: Insufficient documentation

## 2018-06-19 DIAGNOSIS — R7301 Impaired fasting glucose: Secondary | ICD-10-CM | POA: Insufficient documentation

## 2018-06-19 DIAGNOSIS — R740 Nonspecific elevation of levels of transaminase and lactic acid dehydrogenase [LDH]: Secondary | ICD-10-CM | POA: Insufficient documentation

## 2018-06-19 DIAGNOSIS — R350 Frequency of micturition: Secondary | ICD-10-CM | POA: Insufficient documentation

## 2018-06-19 DIAGNOSIS — I10 Essential (primary) hypertension: Secondary | ICD-10-CM | POA: Insufficient documentation

## 2018-06-19 DIAGNOSIS — E559 Vitamin D deficiency, unspecified: Secondary | ICD-10-CM | POA: Insufficient documentation

## 2018-06-19 DIAGNOSIS — E119 Type 2 diabetes mellitus without complications: Secondary | ICD-10-CM | POA: Insufficient documentation

## 2018-06-19 DIAGNOSIS — E785 Hyperlipidemia, unspecified: Secondary | ICD-10-CM | POA: Insufficient documentation

## 2018-06-19 LAB — GFR: EGFR: 58.4

## 2018-06-19 LAB — CBC AND DIFFERENTIAL
Absolute NRBC: 0 10*3/uL (ref 0.00–0.00)
Basophils Absolute Automated: 0.05 10*3/uL (ref 0.00–0.08)
Basophils Automated: 0.8 %
Eosinophils Absolute Automated: 0.37 10*3/uL (ref 0.00–0.44)
Eosinophils Automated: 5.7 %
Hematocrit: 40 % (ref 37.6–49.6)
Hgb: 12.9 g/dL (ref 12.5–17.1)
Immature Granulocytes Absolute: 0.01 10*3/uL (ref 0.00–0.07)
Immature Granulocytes: 0.2 %
Lymphocytes Absolute Automated: 1.43 10*3/uL (ref 0.42–3.22)
Lymphocytes Automated: 21.9 %
MCH: 30.2 pg (ref 25.1–33.5)
MCHC: 32.3 g/dL (ref 31.5–35.8)
MCV: 93.7 fL (ref 78.0–96.0)
MPV: 11.1 fL (ref 8.9–12.5)
Monocytes Absolute Automated: 0.43 10*3/uL (ref 0.21–0.85)
Monocytes: 6.6 %
Neutrophils Absolute: 4.25 10*3/uL (ref 1.10–6.33)
Neutrophils: 64.8 %
Nucleated RBC: 0 /100 WBC (ref 0.0–0.0)
Platelets: 209 10*3/uL (ref 142–346)
RBC: 4.27 10*6/uL (ref 4.20–5.90)
RDW: 15 % (ref 11–15)
WBC: 6.54 10*3/uL (ref 3.10–9.50)

## 2018-06-19 LAB — COMPREHENSIVE METABOLIC PANEL
ALT: 14 U/L (ref 0–55)
AST (SGOT): 17 U/L (ref 5–34)
Albumin/Globulin Ratio: 1.1 (ref 0.9–2.2)
Albumin: 3.8 g/dL (ref 3.5–5.0)
Alkaline Phosphatase: 87 U/L (ref 38–106)
BUN: 18 mg/dL (ref 9.0–28.0)
Bilirubin, Total: 0.6 mg/dL (ref 0.2–1.2)
CO2: 26 mEq/L (ref 21–29)
Calcium: 9.3 mg/dL (ref 7.9–10.2)
Chloride: 106 mEq/L (ref 100–111)
Creatinine: 1.4 mg/dL (ref 0.5–1.5)
Globulin: 3.6 g/dL (ref 2.0–3.7)
Glucose: 94 mg/dL (ref 70–100)
Potassium: 4.2 mEq/L (ref 3.5–5.1)
Protein, Total: 7.4 g/dL (ref 6.0–8.3)
Sodium: 140 mEq/L (ref 136–145)

## 2018-06-19 LAB — VITAMIN D,25 OH,TOTAL: Vitamin D, 25 OH, Total: 35 ng/mL (ref 30–100)

## 2018-06-19 LAB — HEMOGLOBIN A1C
Average Estimated Glucose: 159.9 mg/dL
Hemoglobin A1C: 7.2 % — ABNORMAL HIGH (ref 4.6–5.9)

## 2018-06-19 LAB — LIPID PANEL
Cholesterol / HDL Ratio: 4.8
Cholesterol: 193 mg/dL (ref 0–199)
HDL: 40 mg/dL (ref 40–9999)
LDL Calculated: 134 mg/dL — ABNORMAL HIGH (ref 0–99)
Triglycerides: 93 mg/dL (ref 34–149)
VLDL Calculated: 19 mg/dL (ref 10–40)

## 2018-06-19 LAB — VITAMIN B12: Vitamin B-12: 1305 pg/mL — ABNORMAL HIGH (ref 211–911)

## 2018-06-19 LAB — HEMOLYSIS INDEX: Hemolysis Index: 6 (ref 0–18)

## 2018-06-19 LAB — FERRITIN: Ferritin: 14.39 ng/mL — ABNORMAL LOW (ref 21.80–274.70)

## 2018-06-19 LAB — URIC ACID: Uric acid: 6.4 mg/dL (ref 3.6–9.7)

## 2018-06-19 LAB — TSH: TSH: 1.34 u[IU]/mL (ref 0.35–4.94)

## 2018-06-20 LAB — C-PEPTIDE: C-Peptide: 1.95 ng/mL (ref 0.80–3.85)

## 2018-10-07 ENCOUNTER — Encounter: Payer: Self-pay | Admitting: Emergency Medicine

## 2018-10-07 ENCOUNTER — Emergency Department: Payer: Medicare Other

## 2018-10-07 ENCOUNTER — Emergency Department
Admission: EM | Admit: 2018-10-07 | Discharge: 2018-10-07 | Disposition: A | Discharge status: Home / Self Care | Payer: Medicare Other | Attending: Emergency Medicine | Admitting: Emergency Medicine

## 2018-10-07 ENCOUNTER — Other Ambulatory Visit: Payer: Self-pay

## 2018-10-07 DIAGNOSIS — M791 Myalgia, unspecified site: Secondary | ICD-10-CM | POA: Insufficient documentation

## 2018-10-07 DIAGNOSIS — M7918 Myalgia, other site: Secondary | ICD-10-CM

## 2018-10-07 NOTE — ED Notes (Signed)
NAD noted at time of D/C. Pt denies questions or concerns. Pt ambulatory to the lobby at this time.  
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
## 2018-10-07 NOTE — Discharge Instructions (Signed)
Advised over-the-counter anti-inflammatory medication as needed for pain.
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
## 2019-10-05 ENCOUNTER — Emergency Department: Payer: Medicare Other

## 2019-10-05 ENCOUNTER — Emergency Department
Admission: EM | Admit: 2019-10-05 | Discharge: 2019-10-05 | Disposition: A | Discharge status: Home / Self Care | Payer: Medicare Other | Attending: Emergency Medicine | Admitting: Emergency Medicine

## 2019-10-05 DIAGNOSIS — E119 Type 2 diabetes mellitus without complications: Secondary | ICD-10-CM | POA: Insufficient documentation

## 2019-10-05 DIAGNOSIS — Z7984 Long term (current) use of oral hypoglycemic drugs: Secondary | ICD-10-CM | POA: Insufficient documentation

## 2019-10-05 DIAGNOSIS — J40 Bronchitis, not specified as acute or chronic: Secondary | ICD-10-CM | POA: Insufficient documentation

## 2019-10-05 LAB — COMPREHENSIVE METABOLIC PANEL
ALT: 19 U/L (ref 0–55)
AST (SGOT): 28 U/L (ref 5–34)
Albumin/Globulin Ratio: 0.9 (ref 0.9–2.2)
Albumin: 3.6 g/dL (ref 3.5–5.0)
Alkaline Phosphatase: 86 U/L (ref 38–106)
Anion Gap: 13 (ref 5.0–15.0)
BUN: 17 mg/dL (ref 9.0–28.0)
Bilirubin, Total: 0.6 mg/dL (ref 0.2–1.2)
CO2: 21 mEq/L — ABNORMAL LOW (ref 22–29)
Calcium: 8.4 mg/dL (ref 7.9–10.2)
Chloride: 102 mEq/L (ref 100–111)
Creatinine: 1.3 mg/dL (ref 0.7–1.3)
Globulin: 3.8 g/dL — ABNORMAL HIGH (ref 2.0–3.6)
Glucose: 148 mg/dL — ABNORMAL HIGH (ref 70–100)
Potassium: 4.3 mEq/L (ref 3.5–5.1)
Protein, Total: 7.4 g/dL (ref 6.0–8.3)
Sodium: 136 mEq/L (ref 136–145)

## 2019-10-05 LAB — CBC AND DIFFERENTIAL
Absolute NRBC: 0 10*3/uL (ref 0.00–0.00)
Basophils Absolute Automated: 0.01 10*3/uL (ref 0.00–0.08)
Basophils Automated: 0.2 %
Eosinophils Absolute Automated: 0.01 10*3/uL (ref 0.00–0.44)
Eosinophils Automated: 0.2 %
Hematocrit: 43 % (ref 37.6–49.6)
Hgb: 14 g/dL (ref 12.5–17.1)
Immature Granulocytes Absolute: 0.01 10*3/uL (ref 0.00–0.07)
Immature Granulocytes: 0.2 %
Lymphocytes Absolute Automated: 1.09 10*3/uL (ref 0.42–3.22)
Lymphocytes Automated: 22 %
MCH: 32 pg (ref 25.1–33.5)
MCHC: 32.6 g/dL (ref 31.5–35.8)
MCV: 98.4 fL — ABNORMAL HIGH (ref 78.0–96.0)
MPV: 9.8 fL (ref 8.9–12.5)
Monocytes Absolute Automated: 0.46 10*3/uL (ref 0.21–0.85)
Monocytes: 9.3 %
Neutrophils Absolute: 3.38 10*3/uL (ref 1.10–6.33)
Neutrophils: 68.1 %
Nucleated RBC: 0 /100 WBC (ref 0.0–0.0)
Platelets: 157 10*3/uL (ref 142–346)
RBC: 4.37 10*6/uL (ref 4.20–5.90)
RDW: 13 % (ref 11–15)
WBC: 4.96 10*3/uL (ref 3.10–9.50)

## 2019-10-05 LAB — HEMOLYSIS INDEX: Hemolysis Index: 6 (ref 0–18)

## 2019-10-05 LAB — GFR: EGFR: >60

## 2019-10-05 MED ORDER — ALBUTEROL SULFATE HFA 108 (90 BASE) MCG/ACT IN AERS
2.00 | INHALATION_SPRAY | RESPIRATORY_TRACT | 0 refills | Status: AC | PRN
Start: 2019-10-05 — End: 2019-11-04

## 2019-10-05 NOTE — ED Triage Notes (Signed)
EMERGENCY DEPARTMENT PIT NOTE    Patient Name: [name] has had a rapid medical screening evaluation by myself for the chief complaint of hemoptysis since yesterday..      Vitals: BP 123/64    Pulse 83    Temp 98.1 F (36.7 C) (Oral)    Resp 18    Ht 5\' 9"  (1.753 m)    Wt 74.8 kg    SpO2 97%    BMI 24.37 kg/m   Pertinent brief exam: ambulating well.   Prelim orders: labs, cxr    Patient advised to remain in the ED until further evaluation can be performed. Patient instructed to notify staff of any changes in condition while waiting.    I am not the sole provider and this assessment is only an initial evaluation prior to full evaluation to expedite care.

## 2019-10-05 NOTE — ED Provider Notes (Signed)
EMERGENCY DEPARTMENT HISTORY AND PHYSICAL:    Matei Coomer  [id number]  1932-02-03      HPI: [name] is a 85 y.o. year old male with a PMH significant for diabetes who presents for evaluation of cough present body sputum for the past 2 days.  No recent sick contacts or travel.  Patient does not take any blood thinning medications.  No prior experience with symptoms.  No medication taken for symptoms prior to arrival.  Denies fevers, chills, headache, dizziness, visual changes, neck pain, chest pain, shortness of breath, back pain, abdominal pain, nausea/vomiting/diarrhea.    HISTORIES:     Past Medical History:   Diagnosis Date    Diabetes mellitus     No diagnosis        Past Surgical History:   Procedure Laterality Date    APPENDECTOMY      EGD, BIOPSY N/A 06/30/2015    Procedure: EGD, BIOPSY;  Surgeon: [name], MD;  Location: [facility];  Service: Gastroenterology;  Laterality: N/A;    EGD, COLONOSCOPY N/A 08/04/2015    Procedure: EGD, COLONOSCOPY;  Surgeon: [name], MD;  Location: [facility];  Service: Gastroenterology;  Laterality: N/A;       History reviewed. No pertinent family history.    Relationships   Social Musician on phone: Not on file    Gets together: Not on file    Attends religious service: Not on file    Active member of club or organization: Not on file    Attends meetings of clubs or organizations: Not on file    Relationship status: Not on file           Review of Systems   Constitutional: Negative for chills and fever.   HENT: Negative for congestion, hearing loss and sore throat.    Eyes: Negative for blurred vision, double vision and discharge.   Respiratory: Positive for cough and hemoptysis. Negative for shortness of breath and wheezing.    Cardiovascular: Negative for chest pain and palpitations.   Gastrointestinal: Negative for abdominal pain, constipation, nausea and vomiting.   Genitourinary: Negative for dysuria, frequency and hematuria.    Musculoskeletal: Negative for back pain, myalgias and neck pain.   Skin: Negative for itching and rash.   Neurological: Negative for sensory change, speech change and headaches.   Psychiatric/Behavioral: Negative for memory loss and suicidal ideas.          Physical Exam   Constitutional: He is oriented to person, place, and time and well-developed, well-nourished, and in no distress. No distress.   HENT:   Head: Normocephalic and atraumatic.   Eyes: Pupils are equal, round, and reactive to light. Conjunctivae and EOM are normal.   Neck: Normal range of motion. Neck supple. No tracheal deviation present.   Cardiovascular: Normal rate, normal heart sounds and intact distal pulses. Exam reveals no gallop.   No murmur heard.  Pulmonary/Chest: Effort normal and breath sounds normal. No respiratory distress. He has no wheezes.   Abdominal: Soft. He exhibits no distension and no mass. There is no abdominal tenderness. There is no rebound and no guarding.   Musculoskeletal: Normal range of motion.         General: No tenderness, deformity or edema.   Neurological: He is alert and oriented to person, place, and time.   Skin: Skin is warm and dry. No rash noted.   Psychiatric: Mood, affect and judgment normal.  RESULTS:    Results     Procedure Component Value Units Date/Time    Hemolysis index [[id number]] Collected: 10/05/19 1627     Updated: 10/05/19 1651     Hemolysis Index 6    GFR [[id number]] Collected: 10/05/19 1627     Updated: 10/05/19 1651     EGFR >60.0    Comprehensive metabolic panel [[id number]]  (Abnormal) Collected: 10/05/19 1627    Specimen: Blood Updated: 10/05/19 1651     Glucose 148 mg/dL      BUN 08.6 mg/dL      Creatinine 1.3 mg/dL      Sodium 578 mEq/L      Potassium 4.3 mEq/L      Chloride 102 mEq/L      CO2 21 mEq/L      Calcium 8.4 mg/dL      Protein, Total 7.4 g/dL      Albumin 3.6 g/dL      AST (SGOT) 28 U/L      ALT 19 U/L      Alkaline Phosphatase 86 U/L      Bilirubin, Total 0.6 mg/dL       Globulin 3.8 g/dL      Albumin/Globulin Ratio 0.9     Anion Gap 13.0    CBC and differential [[id number]]  (Abnormal) Collected: 10/05/19 1627    Specimen: Blood Updated: 10/05/19 1638     WBC 4.96 x10 3/uL      Hgb 14.0 g/dL      Hematocrit 41.3 %      Platelets 157 x10 3/uL      RBC 4.37 x10 6/uL      MCV 98.4 fL      MCH 32.0 pg      MCHC 32.6 g/dL      RDW 13 %      MPV 9.8 fL      Neutrophils 68.1 %      Lymphocytes Automated 22.0 %      Monocytes 9.3 %      Eosinophils Automated 0.2 %      Basophils Automated 0.2 %      Immature Granulocytes 0.2 %      Nucleated RBC 0.0 /100 WBC      Neutrophils Absolute 3.38 x10 3/uL      Lymphocytes Absolute Automated 1.09 x10 3/uL      Monocytes Absolute Automated 0.46 x10 3/uL      Eosinophils Absolute Automated 0.01 x10 3/uL      Basophils Absolute Automated 0.01 x10 3/uL      Immature Granulocytes Absolute 0.01 x10 3/uL      Absolute NRBC 0.00 x10 3/uL              Xr Chest  Ap Portable    Result Date: 10/05/2019   No acute pulmonary or pleural disease. Sandie Ano, MD  10/05/2019 4:40 PM         VITALS:   Vitals:    10/05/19 1552 10/05/19 1630   BP: 123/64    Pulse: 83    Resp: 18    Temp: 98.1 F (36.7 C)    TempSrc: Oral    SpO2: 97%    Weight: 74.8 kg 74.8 kg   Height: 5\' 9"  (1.753 m) 5\' 9"  (1.753 m)        Cardiac Monitor:   Heart rate normal 83 bpm.  Respirations normal at 18 breaths/min.  Oxygenation normal at 97% on room air.  EKG:   Rhythm: NSR  Rate: 68  Intervals: normal  Axis: normal  Impression: normal EKG      MDM:     Well-appearing 85 year old male presents for evaluation of cough with blood-tinged sputum.  He reports the blood-tinged sputum has been decreasing since yesterday.  Lungs with good all station bilaterally and patient has no signs of respiratory distress.  Chest x-ray shows no signs of pneumonia.  Patient has no recent travel or TB risk factors.  EKG shows normal sinus rhythm with no ischemic changes.  Hemoglobin is stable on lab work.   And patient is not on any blood thinning medications.  Believe the patient likely has bronchitis.  Will discharge home with strict return precautions and instructions to use albuterol inhaler as needed for symptomatic control.  Instructed patient to follow-up with his primary care doctor in a week for further evaluation.  Patient conveys verbal understanding and agreement with care plan.      Procedures         Disposition: Discharge  Diagnosis:     ICD-10-CM    1. Bronchitis  J40       Condition: Stable, improved  Follow-up:   Multidisciplinary Visit: Provider(s) to Follow Up With            [name], MD   Specialty: Internal Medicine    [facility]  [address] [place]  [place] [address]   Phone: [phone]       Next Steps: Follow up in 1 week(s)             MEDS:      Medication List      START taking these medications    albuterol sulfate HFA 108 (90 Base) MCG/ACT inhaler  Commonly known as: PROVENTIL  Inhale 2 puffs into the lungs every 4 (four) hours as needed for Wheezing or Shortness of Breath (coughing) Dispense with spacer        ASK your doctor about these medications    acetaminophen 325 MG tablet  Commonly known as: TYLENOL  Take 2 tablets (650 mg total) by mouth every 4 (four) hours as needed for Pain.     metFORMIN 1000 MG tablet  Commonly known as: GLUCOPHAGE  Take 1 tablet (1,000 mg total) by mouth every morning with breakfast.           Where to Get Your Medications      These medications were sent to [id number], [place] - [address] AT [facility] [address]  [address] [place] [address]    Phone: [phone]    albuterol sulfate HFA 108 (90 Base) MCG/ACT inhaler            Kailen Name A, DO  10/05/19 1713

## 2019-10-05 NOTE — ED Triage Notes (Signed)
Patient came in on his own because he reports coughing up blood, chest pain, and weakness. He says these symptoms started yesterday morning. He reports mild stomach pain as well. No home medications have been taken.
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
## 2021-04-13 ENCOUNTER — Emergency Department: Payer: Medicare Other

## 2021-04-13 ENCOUNTER — Emergency Department
Admission: EM | Admit: 2021-04-13 | Discharge: 2021-04-13 | Disposition: A | Discharge status: Home / Self Care | Payer: Medicare Other | Attending: Emergency Medicine | Admitting: Emergency Medicine

## 2021-04-13 DIAGNOSIS — W1839XA Other fall on same level, initial encounter: Secondary | ICD-10-CM | POA: Insufficient documentation

## 2021-04-13 DIAGNOSIS — W228XXA Striking against or struck by other objects, initial encounter: Secondary | ICD-10-CM | POA: Insufficient documentation

## 2021-04-13 DIAGNOSIS — S0990XA Unspecified injury of head, initial encounter: Secondary | ICD-10-CM

## 2021-04-13 DIAGNOSIS — S098XXA Other specified injuries of head, initial encounter: Secondary | ICD-10-CM | POA: Insufficient documentation

## 2021-04-13 DIAGNOSIS — R42 Dizziness and giddiness: Secondary | ICD-10-CM

## 2021-04-13 DIAGNOSIS — R55 Syncope and collapse: Secondary | ICD-10-CM | POA: Insufficient documentation

## 2021-04-13 DIAGNOSIS — Y9301 Activity, walking, marching and hiking: Secondary | ICD-10-CM | POA: Insufficient documentation

## 2021-04-13 DIAGNOSIS — F17210 Nicotine dependence, cigarettes, uncomplicated: Secondary | ICD-10-CM | POA: Insufficient documentation

## 2021-04-13 DIAGNOSIS — Y9289 Other specified places as the place of occurrence of the external cause: Secondary | ICD-10-CM | POA: Insufficient documentation

## 2021-04-13 DIAGNOSIS — R7989 Other specified abnormal findings of blood chemistry: Secondary | ICD-10-CM

## 2021-04-13 DIAGNOSIS — X30XXXA Exposure to excessive natural heat, initial encounter: Secondary | ICD-10-CM | POA: Insufficient documentation

## 2021-04-13 LAB — URINALYSIS REFLEX TO MICROSCOPIC EXAM - REFLEX TO CULTURE
Bilirubin, UA: NEGATIVE
Blood, UA: NEGATIVE
Glucose, UA: 50 — AB
Ketones UA: NEGATIVE
Leukocyte Esterase, UA: NEGATIVE
Nitrite, UA: NEGATIVE
Protein, UR: 30 — AB
Specific Gravity UA: 1.013 (ref 1.001–1.035)
Urine pH: 6 (ref 5.0–8.0)
Urobilinogen, UA: NEGATIVE mg/dL (ref 0.2–2.0)

## 2021-04-13 LAB — CBC AND DIFFERENTIAL
Absolute NRBC: 0 10*3/uL (ref 0.00–0.00)
Basophils Absolute Automated: 0.03 10*3/uL (ref 0.00–0.08)
Basophils Automated: 0.5 %
Eosinophils Absolute Automated: 0.13 10*3/uL (ref 0.00–0.44)
Eosinophils Automated: 2 %
Hematocrit: 31 % — ABNORMAL LOW (ref 37.6–49.6)
Hgb: 10.3 g/dL — ABNORMAL LOW (ref 12.5–17.1)
Immature Granulocytes Absolute: 0.02 10*3/uL (ref 0.00–0.07)
Immature Granulocytes: 0.3 %
Lymphocytes Absolute Automated: 0.72 10*3/uL (ref 0.42–3.22)
Lymphocytes Automated: 11.1 %
MCH: 31.6 pg (ref 25.1–33.5)
MCHC: 33.2 g/dL (ref 31.5–35.8)
MCV: 95.1 fL (ref 78.0–96.0)
MPV: 10.3 fL (ref 8.9–12.5)
Monocytes Absolute Automated: 0.35 10*3/uL (ref 0.21–0.85)
Monocytes: 5.4 %
Neutrophils Absolute: 5.23 10*3/uL (ref 1.10–6.33)
Neutrophils: 80.7 %
Nucleated RBC: 0 /100 WBC (ref 0.0–0.0)
Platelets: 196 10*3/uL (ref 142–346)
RBC: 3.26 10*6/uL — ABNORMAL LOW (ref 4.20–5.90)
RDW: 14 % (ref 11–15)
WBC: 6.48 10*3/uL (ref 3.10–9.50)

## 2021-04-13 LAB — GFR: EGFR: 43.4

## 2021-04-13 LAB — COMPREHENSIVE METABOLIC PANEL
ALT: 13 U/L (ref 0–55)
AST (SGOT): 18 U/L (ref 5–34)
Albumin/Globulin Ratio: 1.1 (ref 0.9–2.2)
Albumin: 3 g/dL — ABNORMAL LOW (ref 3.5–5.0)
Alkaline Phosphatase: 69 U/L (ref 37–117)
Anion Gap: 9 (ref 5.0–15.0)
BUN: 17 mg/dL (ref 9.0–28.0)
Bilirubin, Total: 0.7 mg/dL (ref 0.2–1.2)
CO2: 21 mEq/L — ABNORMAL LOW (ref 22–29)
Calcium: 8.9 mg/dL (ref 7.9–10.2)
Chloride: 112 mEq/L — ABNORMAL HIGH (ref 100–111)
Creatinine: 1.8 mg/dL — ABNORMAL HIGH (ref 0.7–1.3)
Globulin: 2.7 g/dL (ref 2.0–3.6)
Glucose: 172 mg/dL — ABNORMAL HIGH (ref 70–100)
Potassium: 4.5 mEq/L (ref 3.5–5.1)
Protein, Total: 5.7 g/dL — ABNORMAL LOW (ref 6.0–8.3)
Sodium: 142 mEq/L (ref 136–145)

## 2021-04-13 LAB — ECG 12-LEAD
Atrial Rate: 71 {beats}/min
IHS MUSE NARRATIVE AND IMPRESSION: NORMAL
P Axis: 49 degrees
P-R Interval: 178 ms
Q-T Interval: 388 ms
QRS Duration: 84 ms
QTC Calculation (Bezet): 421 ms
R Axis: 51 degrees
T Axis: 60 degrees
Ventricular Rate: 71 {beats}/min

## 2021-04-13 LAB — TROPONIN I: Troponin I: <0.01 ng/mL (ref 0.00–0.05)

## 2021-04-13 MED ORDER — SODIUM CHLORIDE 0.9 % IV BOLUS
1000.0000 mL | Freq: Once | INTRAVENOUS | Status: AC
Start: 2021-04-13 — End: 2021-04-13
  Administered 2021-04-13: 1000 mL via INTRAVENOUS

## 2021-04-13 NOTE — ED Provider Notes (Signed)
EMERGENCY DEPARTMENT HISTORY AND PHYSICAL EXAM     None        Date: 04/13/2021  Patient Name: [name]    History of Presenting Illness     Chief Complaint   Patient presents with   . Syncope   . Fall       History Provided By: patient    History: [name] is a 87 y.o. male presenting to the ED with moderate severity, sudden onset, generalized episode of lightheadedness and syncope that occurred prior to arrival.  Patient was walking outside in the heat when he started feeling lightheaded and passed out.  He struck his head on the ground.  Patient states he feels slightly fatigued at this time but no longer lightheaded.  He denies any headache, neck pain, chest pain, abdominal pain, nausea, vomiting, shortness of breath.  Patient states he was in his normal state of health before the walk.  No blood thinner use.  Symptoms seem to be exacerbated by exertion in the heat.  Symptoms are alleviated by liter of IV fluids given by EMS prior to arrival.      PCP: [name], MD  SPECIALISTS:    No current facility-administered medications for this encounter.     Current Outpatient Medications   Medication Sig Dispense Refill   . acetaminophen (TYLENOL) 325 MG tablet Take 2 tablets (650 mg total) by mouth every 4 (four) hours as needed for Pain.  0   . metFORMIN (GLUCOPHAGE) 1000 MG tablet Take 1 tablet (1,000 mg total) by mouth every morning with breakfast.         Past History     Past Medical History:  Past Medical History:   Diagnosis Date   . Diabetes mellitus    . No diagnosis        Past Surgical History:  Past Surgical History:   Procedure Laterality Date   . APPENDECTOMY (OPEN)     . EGD, BIOPSY N/A 06/30/2015    Procedure: EGD, BIOPSY;  Surgeon: [name], MD;  Location: [facility];  Service: Gastroenterology;  Laterality: N/A;   . EGD, COLONOSCOPY N/A 08/04/2015    Procedure: EGD, COLONOSCOPY;  Surgeon: [name], MD;  Location: [facility];  Service: Gastroenterology;  Laterality: N/A;        Family History:  History reviewed. No pertinent family history.    Social History:  Social History     Tobacco Use   . Smoking status: Current Some Day Smoker     Packs/day: 0.50     Types: Cigarettes   . Smokeless tobacco: Current User   . Tobacco comment: smokes 2-3 cigaretes wekly   Substance Use Topics   . Alcohol use: Yes     Comment: occassionally   . Drug use: No       Allergies:  No Known Allergies    Review of Systems     Review of Systems: All other systems reviewed and negative.         Physical Exam   BP 148/66   Pulse 69   Temp 97.8 F (36.6 C) (Oral)   Resp 21   Wt 66.2 kg   SpO2 100%   BMI 21.56 kg/m     Constitutional: Vital signs reviewed. Well appearing. Slim body habitus. Looks younger than stated age. No distress.  Head: Normocephalic, atraumatic  Eyes: Conjunctiva and sclera are normal.  No injection or discharge.  Ears, Nose, Throat:  Normal external examination  of the nose and ears.  Mucous membranes moist.  Neck: Normal range of motion. No midline C-spine tenderness. Trachea midline.  Respiratory/Chest: Clear to auscultation. No respiratory distress.   Cardiovascular: Regular rate and rhythm. No murmurs.  Abdomen:  Bowel sounds intact. No rebound or guarding. Soft.  Non-tender.  Back: No midline CTLS spine tenderness to palpation. Stable pelvis. No hip tenderness.   Upper Extremity:  No edema. No cyanosis. Bilateral radial pulses intact and equal. No focal tenderness.   Lower Extremity:  No edema. No cyanosis. Bilateral calves symmetrical and non-tender. DP and PT pulses intact and equal in the bilateral lower extremities. No focal tenderness.  Skin: Warm and dry. No rash.  Neuro: A&Ox3. CNII-XII intact to testing. Moves all extremities spontaneously. Normal gait.   Psychiatric: Very pleasant.  Normal affect.  Normal insight.      Diagnostic Study Results     Labs -     Results     Procedure Component Value Units Date/Time    Urinalysis Reflex to Microscopic Exam- Reflex to  Culture [[id number]]  (Abnormal) Collected: 04/13/21 1640    Specimen: Urine, Clean Catch Updated: 04/13/21 1700     Urine Type Urine, Clean Ca     Color, UA Yellow     Clarity, UA Clear     Specific Gravity UA 1.013     Urine pH 6.0     Leukocyte Esterase, UA Negative     Nitrite, UA Negative     Protein, UR 30     Glucose, UA 50     Ketones UA Negative     Urobilinogen, UA Negative mg/dL      Bilirubin, UA Negative     Blood, UA Negative     RBC, UA 0 - 2 /hpf      WBC, UA 0 - 5 /hpf      Hyaline Casts, UA 26 - 50 /lpf      Yeast, UA Rare     Urine Mucus Present    Troponin I [[id number]] Collected: 04/13/21 1416    Specimen: Blood Updated: 04/13/21 1450     Troponin I <0.01 ng/mL     Comprehensive metabolic panel [[id number]]  (Abnormal) Collected: 04/13/21 1416    Specimen: Blood Updated: 04/13/21 1447     Glucose 172 mg/dL      BUN 08.6 mg/dL      Creatinine 1.8 mg/dL      Sodium 578 mEq/L      Potassium 4.5 mEq/L      Chloride 112 mEq/L      CO2 21 mEq/L      Calcium 8.9 mg/dL      Protein, Total 5.7 g/dL      Albumin 3.0 g/dL      AST (SGOT) 18 U/L      ALT 13 U/L      Alkaline Phosphatase 69 U/L      Bilirubin, Total 0.7 mg/dL      Globulin 2.7 g/dL      Albumin/Globulin Ratio 1.1     Anion Gap 9.0    GFR [[id number]] Collected: 04/13/21 1416     Updated: 04/13/21 1447     EGFR 43.4    CBC and differential [[id number]]  (Abnormal) Collected: 04/13/21 1416    Specimen: Blood Updated: 04/13/21 1429     WBC 6.48 x10 3/uL      Hgb 10.3 g/dL      Hematocrit 27.2 %      Platelets 196  x10 3/uL      RBC 3.26 x10 6/uL      MCV 95.1 fL      MCH 31.6 pg      MCHC 33.2 g/dL      RDW 14 %      MPV 10.3 fL      Neutrophils 80.7 %      Lymphocytes Automated 11.1 %      Monocytes 5.4 %      Eosinophils Automated 2.0 %      Basophils Automated 0.5 %      Immature Granulocytes 0.3 %      Nucleated RBC 0.0 /100 WBC      Neutrophils Absolute 5.23 x10 3/uL      Lymphocytes Absolute Automated 0.72 x10 3/uL      Monocytes Absolute  Automated 0.35 x10 3/uL      Eosinophils Absolute Automated 0.13 x10 3/uL      Basophils Absolute Automated 0.03 x10 3/uL      Immature Granulocytes Absolute 0.02 x10 3/uL      Absolute NRBC 0.00 x10 3/uL           Radiologic Studies -   Radiology Results (24 Hour)     Procedure Component Value Units Date/Time    CT Cervical Spine without Contrast [[id number]] Collected: 04/13/21 1615    Order Status: Completed Updated: 04/13/21 1623    Narrative:      Clinical History:  fall head injury    Examination:  CT Cervical spine without contrast.    CT images were acquired utilizing Automated Exposure Control for dose  reduction.     Comparison:  03/21/15    Findings:    The cervical alignment is intact.  No acute cervical spine fracture  is identified.  The vertebral body heights are intact.  No suspicious  osseous lesions are identified.  The craniocervical junction appears  intact. There are moderate degenerative disc changes at C5-6 and C6-7.  There is mild multilevel spinal canal stenosis. There is no prevertebral  soft tissue swelling.      Impression:          No evidence of acute cervical spine fracture or malalignment.    Carleene Overlie, MD   04/13/2021 4:21 PM    CT Head without Contrast [[id number]] Collected: 04/13/21 1604    Order Status: Completed Updated: 04/13/21 1609    Narrative:      EXAM: CT OF HEAD WITHOUT CONTRAST    HISTORY: 87 year old male, syncope and head injury    PROCEDURE: Standard unenhanced CT of Head    COMPARISON: 06/19/2016    FINDINGS: The brain parenchyma demonstrates normal density without  evidence of acute territorial ischemic change or intraparenchymal  hemorrhage. There is no mass, mass effect, or midline shift. No abnormal  intra-/extra-axial fluid collection is noted. The ventricles, sulci, and  subarachnoid cisterns are normal in configuration.     Periventricular and deep white matter ischemic changes are present.  Age-related cortical involutional changes are noted. Thinning of  the  corpus callosum is noted. Atherosclerotic calcifications are present in  the skull base vessels and internal carotids.     The visualized bones, orbits, and paranasal sinuses unremarkable.      Impression:        1. No acute intracranial pathology.   2. Stable senescent changes.    Berlinda Last, MD   04/13/2021 4:07 PM    Chest AP Portable [[id number]] Collected: 04/13/21 1455  Order Status: Completed Updated: 04/13/21 1459    Narrative:      PROCEDURE: AP PORTABLE CXR      HISTORY: 87 year old male, chest pain      COMPARISON: 10/05/2019    FINDINGS: The lungs are hypoinflated with no infiltrate or  consolidation. COPD changes are suggested in the upper lungs. The  cardiomediastinal silhouette is within normal limits. The pulmonary  vasculature is normal.  The hilar shadows are normal. There is no  pneumothorax or pleural effusion. Severe degenerative changes of  bilateral AC joints are noted.        Impression:       No acute cardiopulmonary disease.     Berlinda Last, MD   04/13/2021 2:57 PM      .    Medical Decision Making   I am the first provider for this patient.    I reviewed the vital signs, available nursing notes, past medical history, past surgical history, family history and social history.    Vital Signs-Reviewed the patient's vital signs.     Patient Vitals for the past 12 hrs:   BP Temp Pulse Resp   04/13/21 1620 148/66 -- 69 21   04/13/21 1458 -- 97.8 F (36.6 C) -- --   04/13/21 1420 108/53 -- 71 --   04/13/21 1419 110/53 -- 70 --   04/13/21 1355 102/52 -- 71 18         EKG:  Interpreted by the EP.   Time Interpreted: 218   Rate: 71   Rhythm: NSR    Interpretation: no STEMI; T wave inversion V2; + early repol V3-V6, II, III, AVF   Comparison: 10/05/19--new T wave inversion V2, stable early repol V3-V6, II, III, AVF      ED Course:     230 -patient feels completely fine and would like to leave.  He is amenable to waiting for his results come back before leaving.    500 reviewed all results and  impressions with patient, including elevated creatinine.  Discussed risks and benefits of admission versus discharge home.  Patient states he feels completely fine.  He is ambulating without difficulty.  Repeat neurological remains nonfocal.  He is tolerating p.o. intake.  He agrees to think things over before making decision.    609 -Per nurse, patient pulled out IV and left I was with another patient.  He states he felt completely fine.    Provider Notes: Patient presenting to ED with syncopal episode and closed head injury while walking in the heat.  Asymptomatic in the ED and ambulating without difficulty.  Nonfocal neurological exam.  No chest pain or shortness of breath.  No abdominal pain.  Low clinical suspicion for ACS, dysrhythmia, PE, ruptured AAA, bacterial sepsis, other acutely life-threatening etiology of symptoms.          Diagnosis     Clinical Impression:   1. Syncope and collapse    2. Closed head injury, initial encounter    3. Elevated serum creatinine        Treatment Plan:   ED Disposition     ED Disposition   Discharge    Condition   --    Date/Time   Mon Apr 13, 2021  6:07 PM    Comment   [name] discharge to home/self care.    Condition at disposition: Stable                 _______________________________    This  note was generated by the Epic EMR system/ Dragon speech recognition and may contain inherent errors or omissions not intended by the user. Grammatical errors, random word insertions, deletions and pronoun errors  are occasional consequences of this technology due to software limitations. Not all errors are caught or corrected. If there are questions or concerns about the content of this note or information contained within the body of this dictation they should be addressed directly with the author for clarification.      Attestations: This note is prepared by Lynnea Ferrier, MD    _______________________________       Maryella Shivers, MD  04/14/21 [phone]

## 2021-04-13 NOTE — ED Triage Notes (Signed)
BIBA found on a walking path and passed out with LOC and hit head. Awake and oriented now.  EMS stated BP low 80's and fluids given before arrival.  BP 98/54, HR 70.  PMH diabetes.  Patient remembers getting dizzy and passing out.

## 2021-04-13 NOTE — ED Notes (Signed)
Bed: PU36  Expected date:   Expected time:   Means of arrival:   Comments:  Medic

## 2021-05-19 ENCOUNTER — Emergency Department: Payer: Medicare Other

## 2021-05-19 ENCOUNTER — Observation Stay
Admission: EM | Admit: 2021-05-19 | Discharge: 2021-05-22 | Disposition: A | Discharge status: Home / Self Care | Payer: Medicare Other | Attending: Internal Medicine | Admitting: Internal Medicine

## 2021-05-19 DIAGNOSIS — R2689 Other abnormalities of gait and mobility: Secondary | ICD-10-CM | POA: Insufficient documentation

## 2021-05-19 DIAGNOSIS — E119 Type 2 diabetes mellitus without complications: Secondary | ICD-10-CM | POA: Insufficient documentation

## 2021-05-19 DIAGNOSIS — E875 Hyperkalemia: Secondary | ICD-10-CM | POA: Insufficient documentation

## 2021-05-19 DIAGNOSIS — R110 Nausea: Secondary | ICD-10-CM | POA: Insufficient documentation

## 2021-05-19 DIAGNOSIS — F17210 Nicotine dependence, cigarettes, uncomplicated: Secondary | ICD-10-CM | POA: Insufficient documentation

## 2021-05-19 DIAGNOSIS — N179 Acute kidney failure, unspecified: Secondary | ICD-10-CM | POA: Insufficient documentation

## 2021-05-19 DIAGNOSIS — N400 Enlarged prostate without lower urinary tract symptoms: Secondary | ICD-10-CM | POA: Insufficient documentation

## 2021-05-19 DIAGNOSIS — E785 Hyperlipidemia, unspecified: Secondary | ICD-10-CM | POA: Insufficient documentation

## 2021-05-19 DIAGNOSIS — Z20822 Contact with and (suspected) exposure to covid-19: Secondary | ICD-10-CM | POA: Insufficient documentation

## 2021-05-19 DIAGNOSIS — K5790 Diverticulosis of intestine, part unspecified, without perforation or abscess without bleeding: Secondary | ICD-10-CM | POA: Insufficient documentation

## 2021-05-19 DIAGNOSIS — R079 Chest pain, unspecified: Secondary | ICD-10-CM

## 2021-05-19 DIAGNOSIS — Z7984 Long term (current) use of oral hypoglycemic drugs: Secondary | ICD-10-CM | POA: Insufficient documentation

## 2021-05-19 DIAGNOSIS — I951 Orthostatic hypotension: Secondary | ICD-10-CM | POA: Insufficient documentation

## 2021-05-19 DIAGNOSIS — E860 Dehydration: Secondary | ICD-10-CM | POA: Insufficient documentation

## 2021-05-19 DIAGNOSIS — Z79899 Other long term (current) drug therapy: Secondary | ICD-10-CM | POA: Insufficient documentation

## 2021-05-19 DIAGNOSIS — Z7901 Long term (current) use of anticoagulants: Secondary | ICD-10-CM | POA: Insufficient documentation

## 2021-05-19 DIAGNOSIS — R55 Syncope and collapse: Principal | ICD-10-CM | POA: Insufficient documentation

## 2021-05-19 LAB — COMPREHENSIVE METABOLIC PANEL
ALT: 14 U/L (ref 0–55)
AST (SGOT): 18 U/L (ref 5–34)
Albumin/Globulin Ratio: 1.1 (ref 0.9–2.2)
Albumin: 3.5 g/dL (ref 3.5–5.0)
Alkaline Phosphatase: 75 U/L (ref 37–117)
Anion Gap: 9 (ref 5.0–15.0)
BUN: 22 mg/dL (ref 9.0–28.0)
Bilirubin, Total: 0.5 mg/dL (ref 0.2–1.2)
CO2: 22 mEq/L (ref 22–29)
Calcium: 9.2 mg/dL (ref 7.9–10.2)
Chloride: 108 mEq/L (ref 100–111)
Creatinine: 1.4 mg/dL — ABNORMAL HIGH (ref 0.7–1.3)
Globulin: 3.2 g/dL (ref 2.0–3.6)
Glucose: 97 mg/dL (ref 70–100)
Potassium: 4.7 mEq/L (ref 3.5–5.1)
Protein, Total: 6.7 g/dL (ref 6.0–8.3)
Sodium: 139 mEq/L (ref 136–145)

## 2021-05-19 LAB — CBC AND DIFFERENTIAL
Absolute NRBC: 0 10*3/uL (ref 0.00–0.00)
Basophils Absolute Automated: 0.04 10*3/uL (ref 0.00–0.08)
Basophils Automated: 0.5 %
Eosinophils Absolute Automated: 0.24 10*3/uL (ref 0.00–0.44)
Eosinophils Automated: 2.9 %
Hematocrit: 36.2 % — ABNORMAL LOW (ref 37.6–49.6)
Hgb: 12.1 g/dL — ABNORMAL LOW (ref 12.5–17.1)
Immature Granulocytes Absolute: 0.01 10*3/uL (ref 0.00–0.07)
Immature Granulocytes: 0.1 %
Lymphocytes Absolute Automated: 1.18 10*3/uL (ref 0.42–3.22)
Lymphocytes Automated: 14.5 %
MCH: 31.9 pg (ref 25.1–33.5)
MCHC: 33.4 g/dL (ref 31.5–35.8)
MCV: 95.5 fL (ref 78.0–96.0)
MPV: 9.8 fL (ref 8.9–12.5)
Monocytes Absolute Automated: 0.45 10*3/uL (ref 0.21–0.85)
Monocytes: 5.5 %
Neutrophils Absolute: 6.22 10*3/uL (ref 1.10–6.33)
Neutrophils: 76.5 %
Nucleated RBC: 0 /100 WBC (ref 0.0–0.0)
Platelets: 224 10*3/uL (ref 142–346)
RBC: 3.79 10*6/uL — ABNORMAL LOW (ref 4.20–5.90)
RDW: 14 % (ref 11–15)
WBC: 8.14 10*3/uL (ref 3.10–9.50)

## 2021-05-19 LAB — COVID-19 (SARS-COV-2): SARS CoV-2: NEGATIVE

## 2021-05-19 LAB — TROPONIN I: Troponin I: <0.01 ng/mL (ref 0.00–0.05)

## 2021-05-19 LAB — GFR: EGFR: 58

## 2021-05-19 MED ORDER — ASPIRIN 325 MG PO TABS
325.0000 mg | ORAL_TABLET | Freq: Once | ORAL | Status: AC
Start: 2021-05-19 — End: 2021-05-19
  Administered 2021-05-19: 23:00:00 325 mg via ORAL
  Filled 2021-05-19: qty 1

## 2021-05-19 NOTE — ED Notes (Signed)
[facility] EMERGENCY DEPARTMENT  ED NURSING NOTE FOR THE RECEIVING INPATIENT NURSE   ED NURSE Mat Carne [phone]   ED CHARGE RN [phone]   ADMISSION INFORMATION   [name] is a 87 y.o. male admitted with a diagnosis of:    1. Syncope and collapse         Isolation: None   Allergies: Patient has no known allergies.   Holding Orders confirmed? Yes   Belongings Documented? Yes   Home medications sent to pharmacy confirmed? NA   NURSING CARE   Patient Comes From:   Mental Status: Home Independent  alert and oriented   ADL: Independent with all ADLs   Ambulation: no difficulty   Pertinent Information  and Safety Concerns: NA     COVID Test sent to lab? Yes   VITAL SIGNS   Time BP Temp Pulse Resp SpO2   2000 118/57 97.6 71 17 100   CT / NIH   CT Head ordered on this patient?  Yes   NIH/Dysphagia assessment done prior to admission? Yes   PERSONAL PROTECTIVE EQUIPMENT   Gloves   LAB RESULTS   Labs Reviewed   CBC AND DIFFERENTIAL - Abnormal; Notable for the following components:       Result Value    Hgb 12.1 (*)     Hematocrit 36.2 (*)     RBC 3.79 (*)     All other components within normal limits   COMPREHENSIVE METABOLIC PANEL - Abnormal; Notable for the following components:    Creatinine 1.4 (*)     All other components within normal limits   COVID-19 (SARS-COV-2)    Narrative:     o Collect and clearly label specimen type:  o Upper respiratory specimen: One Nasopharyngeal Dry Swab NO  Transport Media.  o Hand deliver to laboratory ASAP  Indication for testing->Extended care facility admission to  semi private room  Screening   TROPONIN I   GFR          Ticket to Ride Printed: Yes

## 2021-05-19 NOTE — ED Provider Notes (Signed)
EMERGENCY DEPARTMENT HISTORY AND PHYSICAL EXAM        Date: 05/19/2021  Patient Name: [name]    History of Presenting Illness     Chief Complaint   Patient presents with    Syncope       History Provided By: pt     Chief Complaint: syncope  Onset: pta  Timing: while getting out of car  Location: fell onto the street  Quality: denies injuries from fall, says he was lightheaded   Severity: moderate  Modifying Factors: none  Associated Symptoms: per triage note he had mild left sided cp prior to fall    Additional History: [name] is a 87 y.o. male with a h/o lacunar infact, gi bleed, anemia, t2dm, hld, syncope presents after a syncopal episode he says a couple hours ago. He says he was going out to eat with family and as he was getting out of the car he felt lightheaded and passed out. He denies injuries from the fall. He says that he had a mild headache earlier and some nausea and abdominal discomfort. He denies rectal bleeding.     PCP: [name], MD      Current Facility-Administered Medications   Medication Dose Route Frequency Provider Last Rate Last Admin    acetaminophen (TYLENOL) tablet 650 mg  650 mg Oral Q6H PRN Willey Blade, MD        Or    acetaminophen (TYLENOL) suppository 650 mg  650 mg Rectal Q6H PRN Willey Blade, MD        calcium carbonate (TUMS) chewable tablet 1,000 mg  1,000 mg Oral Q6H PRN Willey Blade, MD        dextrose  5 % and 0.45 % NaCl infusion   Intravenous Continuous Willey Blade, MD 100 mL/hr at 05/20/21 0934 New Bag at 05/20/21 0934    glucagon (rDNA) (GLUCAGEN) injection 1 mg  1 mg Intramuscular PRN Willey Blade, MD        And    dextrose 5 % bolus 250 mL  250 mL Intravenous PRN Willey Blade, MD        And    dextrose 50 % bolus 25 g  25 g Intravenous PRN Willey Blade, MD        And    dextrose (D10W) 10% bolus 250 mL  25 g Intravenous PRN Willey Blade, MD        enoxaparin (LOVENOX) syringe 40 mg  40 mg  Subcutaneous Daily Lawson Fiscal A, MD   40 mg at 05/20/21 0934    magnesium sulfate 1g in dextrose 5% IVPB (premix)  1 g Intravenous PRN Willey Blade, MD        melatonin tablet 3 mg  3 mg Oral QHS PRN Willey Blade, MD        naloxone [facility]) injection 0.2 mg  0.2 mg Intravenous PRN Willey Blade, MD        ondansetron (ZOFRAN-ODT) disintegrating tablet 4 mg  4 mg Oral Q6H PRN Willey Blade, MD        Or    ondansetron (ZOFRAN) injection 4 mg  4 mg Intravenous Q6H PRN Willey Blade, MD        potassium & sodium phosphates (PHOS-NAK) 280-160-250 MG packet 2 packet  2 packet Oral PRN Lawson Fiscal A, MD        potassium chloride (KLOR-CON) CR tablet 0-40 mEq  0-40 mEq Oral PRN Willey Blade, MD        And    potassium chloride 10 mEq in 100 mL IVPB (premix)  10 mEq Intravenous PRN Willey Blade, MD           Past History     Past Medical History:  Past Medical History:   Diagnosis Date    Diabetes mellitus     No diagnosis        Past Surgical History:  Past Surgical History:   Procedure Laterality Date    APPENDECTOMY (OPEN)      EGD, BIOPSY N/A 06/30/2015    Procedure: EGD, BIOPSY;  Surgeon: [name], MD;  Location: [facility];  Service: Gastroenterology;  Laterality: N/A;    EGD, COLONOSCOPY N/A 08/04/2015    Procedure: EGD, COLONOSCOPY;  Surgeon: [name], MD;  Location: [facility];  Service: Gastroenterology;  Laterality: N/A;       Family History:  No family history on file.    Social History:  Social History     Tobacco Use    Smoking status: Some Days     Packs/day: 0.50     Pack years: 0.00     Types: Cigarettes    Smokeless tobacco: Current    Tobacco comments:     smokes 2-3 cigaretes wekly   Substance Use Topics    Alcohol use: Yes     Comment: occassionally    Drug use: No       Allergies:  No Known Allergies    Review of Systems     Review of Systems   Constitutional:  Negative for chills, diaphoresis and fever.   HENT:  Negative for  congestion.    Eyes:  Negative for discharge and redness.   Respiratory:  Negative for cough and shortness of breath.    Cardiovascular:  Negative for leg swelling.   Gastrointestinal:  Negative for diarrhea and vomiting.   Musculoskeletal:  Positive for falls. Negative for back pain, joint pain and neck pain.   Skin:  Negative for itching.   Neurological:  Positive for loss of consciousness and headaches. Negative for sensory change, speech change and focal weakness.   Endo/Heme/Allergies:  Does not bruise/bleed easily.   Psychiatric/Behavioral:  Negative for hallucinations.     Physical Exam   BP 130/75   Pulse 75   Temp 97.7 F (36.5 C) (Oral)   Resp 12   Ht 5\' 11"  (1.803 m)   Wt 61.3 kg   SpO2 100%   BMI 18.86 kg/m   Physical Exam  Vitals reviewed.   Constitutional:       General: He is not in acute distress.     Appearance: Normal appearance. He is not ill-appearing or toxic-appearing.   HENT:      Head: Normocephalic and atraumatic.      Nose: No congestion or rhinorrhea.   Eyes:      Extraocular Movements: Extraocular movements intact.   Cardiovascular:      Rate and Rhythm: Normal rate and regular rhythm.   Pulmonary:      Effort: Pulmonary effort is normal.      Breath sounds: Normal breath sounds.   Abdominal:      General: Bowel sounds are normal.      Palpations: Abdomen is soft.      Tenderness: There is no abdominal tenderness.   Musculoskeletal:         General: Normal range of motion.  Cervical back: No tenderness.      Right lower leg: No edema.      Left lower leg: No edema.   Skin:     General: Skin is warm and dry.   Neurological:      General: No focal deficit present.      Mental Status: He is alert and oriented to person, place, and time.      Cranial Nerves: No cranial nerve deficit.      Motor: No weakness.      Gait: Gait normal.   Psychiatric:         Mood and Affect: Mood normal.         Behavior: Behavior normal.         Thought Content: Thought content normal.          Judgment: Judgment normal.         Diagnostic Study Results     Labs -     Results       Procedure Component Value Units Date/Time    Basic Metabolic Panel [[id number]]  (Abnormal) Collected: 05/20/21 0946    Specimen: Blood Updated: 05/20/21 1015     Glucose 110 mg/dL      BUN 78.4 mg/dL      Creatinine 1.0 mg/dL      Calcium 8.8 mg/dL      Sodium 696 mEq/L      Potassium 4.4 mEq/L      Chloride 109 mEq/L      CO2 25 mEq/L      Anion Gap 8.0    GFR [[id number]] Collected: 05/20/21 0946     Updated: 05/20/21 1015     EGFR >60.0    Troponin I [[id number]] Collected: 05/20/21 0427    Specimen: Blood Updated: 05/20/21 0536     Troponin I <0.01 ng/mL     Troponin I [[id number]] Collected: 05/20/21 0123    Specimen: Blood Updated: 05/20/21 0203     Troponin I <0.01 ng/mL     Basic Metabolic Panel [[id number]]  (Abnormal) Collected: 05/20/21 0123    Specimen: Blood Updated: 05/20/21 0158     Glucose 107 mg/dL      BUN 56.4 mg/dL      Creatinine 1.3 mg/dL      Calcium 9.5 mg/dL      Sodium 332 mEq/L      Potassium 5.3 mEq/L      Chloride 106 mEq/L      CO2 24 mEq/L      Anion Gap 10.0    GFR [[id number]] Collected: 05/20/21 0123     Updated: 05/20/21 0158     EGFR >60.0    COVID-19 (SARS-COV-2) ([facility]) [[id number]] Collected: 05/19/21 2300    Specimen: Nasopharyngeal Swab from Nasopharynx Updated: 05/19/21 2341     Purpose of COVID testing Screening     SARS-CoV-2 Specimen Source Nasopharyngeal     SARS CoV-2 Negative    Narrative:      o Collect and clearly label specimen type:  o Upper respiratory specimen: One Nasopharyngeal Dry Swab NO  Transport Media.  o Hand deliver to laboratory ASAP  Indication for testing->Extended care facility admission to  semi private room  Screening    Troponin I [[id number]] Collected: 05/19/21 1941    Specimen: Blood Updated: 05/19/21 2007     Troponin I <0.01 ng/mL     GFR [[id number]] Collected: 05/19/21 1941     Updated: 05/19/21 2004     EGFR 58.0  Comprehensive metabolic panel  [[id number]]  (Abnormal) Collected: 05/19/21 1941    Specimen: Blood Updated: 05/19/21 2004     Glucose 97 mg/dL      BUN 36.6 mg/dL      Creatinine 1.4 mg/dL      Sodium 440 mEq/L      Potassium 4.7 mEq/L      Chloride 108 mEq/L      CO2 22 mEq/L      Calcium 9.2 mg/dL      Protein, Total 6.7 g/dL      Albumin 3.5 g/dL      AST (SGOT) 18 U/L      ALT 14 U/L      Alkaline Phosphatase 75 U/L      Bilirubin, Total 0.5 mg/dL      Globulin 3.2 g/dL      Albumin/Globulin Ratio 1.1     Anion Gap 9.0    CBC and differential [[id number]]  (Abnormal) Collected: 05/19/21 1941    Specimen: Blood Updated: 05/19/21 1950     WBC 8.14 x10 3/uL      Hgb 12.1 g/dL      Hematocrit 38.7 %      Platelets 224 x10 3/uL      RBC 3.79 x10 6/uL      MCV 95.5 fL      MCH 31.9 pg      MCHC 33.4 g/dL      RDW 14 %      MPV 9.8 fL      Neutrophils 76.5 %      Lymphocytes Automated 14.5 %      Monocytes 5.5 %      Eosinophils Automated 2.9 %      Basophils Automated 0.5 %      Immature Granulocytes 0.1 %      Nucleated RBC 0.0 /100 WBC      Neutrophils Absolute 6.22 x10 3/uL      Lymphocytes Absolute Automated 1.18 x10 3/uL      Monocytes Absolute Automated 0.45 x10 3/uL      Eosinophils Absolute Automated 0.24 x10 3/uL      Basophils Absolute Automated 0.04 x10 3/uL      Immature Granulocytes Absolute 0.01 x10 3/uL      Absolute NRBC 0.00 x10 3/uL             Radiologic Studies -   Radiology Results (24 Hour)       Procedure Component Value Units Date/Time    CT Abd/Pelvis without Contrast [[id number]] Collected: 05/19/21 2041    Order Status: Completed Updated: 05/19/21 2053    Narrative:       CT ABDOMEN PELVIS WO IV/ WO PO CONT    CLINICAL HISTORY: Abdominal pain. abd pain , syncope    TECHNIQUE: Axial computed tomography of the abdomen was obtained from  the dome of the diaphragm to the iliac crests. Then, axial spiral CT of  the pelvis was obtained from the iliac crests to the symphysis pubis.  Intravenous contrast was not utilized. Oral  contrast was not utilized.  This protocol was used for the evaluation of clinically suspected renal  stones. Lack of IV contrast makes assessment of abdominal and pelvic  structures suboptimal. Lack of oral contrast makes assessment of bowel  and some adjacent structures suboptimal.  CT Dose reduction technique: One or more the following dose reduction  techniques were utilized: Automated exposure control; Adjustment of the  MVA and/or KVP according to patient's size;  Use of the iterative  reconstruction technique.    COMPARISON: 04/29/2016     FINDINGS: No gross abnormality in the visualized liver and spleen. Aorta  and IVC are not enlarged. Pa2ncreas shows atrophy. Gallbladder is  contracted. Adrenals are not enlarged. No significant adenopathy. Bowel  loops are nondilated. No focal collection or mass.  Stool retention in the rectosigmoid. A few scattered diverticula a  noted.  No active diverticulitis.    Both kidneys show normal contour. No hydronephrosis.     Bladder appears contracted with mild wall thickening which is a stable  finding.. Vessels are nondilated. Bowel loops in the pelvis are  nondilated. No destructive bone process in the pelvis. Enlarged prostate  gland, measuring 5.9 cm, stable.      Impression:       DIVERTICULOSIS. PROSTRATE GLAND ENLARGEMENT. NO  HYDRONEPHROSIS. NO BOWEL OBSTRUCTION. MILD STOOL RETENTION.    Darnelle Maffucci, MD   05/19/2021 8:51 PM    CT Head WO Contrast [[id number]] Collected: 05/19/21 2039    Order Status: Completed Updated: 05/19/21 2043    Narrative:      CT HEAD WO CONTRAST    CLINICAL INDICATION: Mild headache. Syncope, recurrent    TECHNIQUE:  Noncontrast CT scan of the head was performed. Axial images  were obtained. Sagittal and coronal MPR reformatting performed.  CT Dose reduction technique: One or more the following dose reduction  techniques were utilized: Automated exposure control; Adjustment of the  MVA and/or KVP according to patient's size; Use of the  iterative  reconstruction technique.    COMPARISON: 04/13/2021     FINDINGS:    Atrophy noted, appearing stable. The ventricles and cisterns are clear.  No acute bleed. No acute cortical ischemic abnormality. No mass effect  or midline shift. No gross abnormality in the posterior fossa; beam  hardening artifacts are seen.  No evidence for an acute intracranial abnormality.        Impression:          NO ACUTE INTRACRANIAL ABNORMALITY.    Darnelle Maffucci, MD   05/19/2021 8:41 PM    Chest AP Portable [[id number]] Collected: 05/19/21 2019    Order Status: Completed Updated: 05/19/21 2021    Narrative:      XR CHEST AP PORTABLE    CLINICAL INDICATION: Chest Pain     TECHNIQUE: The following radiographs obtained per protocol:XR CHEST AP  PORTABLE    COMPARISON: 04/13/2021    FINDINGS:    Cardiomediastinal silhouette is within normal limits. Mild left base  scarring, unchanged. No infiltrate. No effusion no pulmonary edema. No  acute abnormalities are apparent. No active disease.      Impression:          No acute abnormality.    Darnelle Maffucci, MD   05/19/2021 8:19 PM        .      Medical Decision Making   I am the first provider for this patient.    Vital Signs-Reviewed the patient's vital signs.   Patient Vitals for the past 12 hrs:   BP Temp Pulse Resp   05/20/21 1120 130/75 -- 75 --   05/20/21 0937 110/66 -- 85 --   05/20/21 0935 163/68 -- 73 --   05/20/21 0933 155/67 -- 73 --   05/20/21 0759 139/69 97.7 F (36.5 C) 73 12   05/20/21 0423 158/69 98.8 F (37.1 C) 69 17         EKG:  Interpreted by the EP.  Time Interpreted:    Rate: 87   Rhythm: Normal Sinus Rhythm    Interpretation:no acute changes       ED Course:       ED Course as of 05/20/21 1332   Tue May 19, 2021   2209 D/w dr. Lillie Columbia who will come to see pt.  [AN]      ED Course User Index  [AN] Azzie Glatter, MD       Provider Notes: admit to tele      Diagnosis     Clinical Impression:   1. Syncope and collapse         _______________________________    Attestations:  This note is prepared by Avanell Shackleton, MD.     Avanell Shackleton, MD.  I confirm that the note above accurately reflects all work, treatment, procedures, and medical decision making performed by me.    _______________________________         Azzie Glatter, MD  05/20/21 [phone]

## 2021-05-19 NOTE — ED Triage Notes (Addendum)
Pt BIB by family after witnessed syncopal episode while trying to get into car. Pt did endorse mild L sided chest pain and dizziness prior to episode. Denies CP/SOB at present though mild dizziness persists.Did not hit head, no obvious signs of trauma.Not on blood thinners. Pt denying any pain. Prior to incident pt states was in normal state of health denying n/v/d/fever/cough. Initially hypotensive on triage assessment though first initial in room 115/55. Pt awake, alert and oriented x4. Moving all extremities. PMH DM

## 2021-05-19 NOTE — H&P (Signed)
[facility]  ADMISSION HISTORY & PHYSICAL          Date Time: 05/19/21 10:08 PM  Patient Name: [name]  Attending Physician: Azzie Glatter, MD    Assessment:     Syncope possibly related to earlier intake of alcohol  Orthostatic hypotension  Acute kidney injury  Type 2 diabetes mellitus  Benign prostatic hypertrophy  Hyperlipidemia    Plan:     Patient will be admitted to medical floor for observation.  Will be placed on telemetry and will monitor for arrhythmia.  Will check serial troponin to rule out MI  We will hydrate him with intravenous fluids overnight.  We will check his renal function test tomorrow.  Syncope may be related to the alcoholic drink that he had before he went out of his house  No neurological episode and patient appears to be at his baseline on examination  We will place him on sliding scale insulin coverage for his diabetes  DVT prophylaxis with SCDs and Lovenox  Plan of care discussed with patient  Plan of care discussed with his nurse  Further recommendation to follow, follow patient in the hospital  Dr. Ebbie Ridge to assume care in the morning  Patient might be able to go home tomorrow and would not warrant any further work-up.    Chief Complaint:     Chief Complaint   Patient presents with    Syncope     History of Present Illness:     [name] is a 87 y.o. male with past medical history remarkable for diabetes mellitus, history of GI bleeding, anemia, benign prostatic hypertrophy who presented to [facility] emergency room after a syncopal episode.  Patient was going out to eat with his family and was getting out of the car when he felt lightheaded and passed out.  He denied any head injury or loss of consciousness.  He has no antecedent symptoms like palpitations, chest pain or headache.  He stated that he had an alcoholic drink from [place] at home before he left home.  He thinks that he took it to be too much that made him elevated dizzy.    On  presentation, his blood pressure was 84/46 mmHg, heart rate of 70/min, respiratory rate of 18/min, temperature 97.6 F and oxygen saturation of 90% on room air.  His labs showed normal CBC, elevated renal function with BUN of 22 and creatinine of 1.4 with otherwise normal labs.  First set of troponin was negative at 0.01.  Imaging studies with CT head showed no acute intracranial abnormality.  Chest x-ray showed no acute abnormality.  CT abdomen pelvis showed diverticulosis and prostate enlargement and mild stool retention.  He will be admitted for further evaluation of syncope.    Past Medical History:     Past Medical History:   Diagnosis Date    Diabetes mellitus     No diagnosis      Past Surgical History:     Past Surgical History:   Procedure Laterality Date    APPENDECTOMY (OPEN)      EGD, BIOPSY N/A 06/30/2015    Procedure: EGD, BIOPSY;  Surgeon: [name], MD;  Location: [facility];  Service: Gastroenterology;  Laterality: N/A;    EGD, COLONOSCOPY N/A 08/04/2015    Procedure: EGD, COLONOSCOPY;  Surgeon: [name], MD;  Location: [facility];  Service: Gastroenterology;  Laterality: N/A;     Family History:     No family history on  file.    Social History:     Social History     Socioeconomic History    Marital status: Single     Spouse name: Not on file    Number of children: Not on file    Years of education: Not on file    Highest education level: Not on file   Occupational History    Not on file   Tobacco Use    Smoking status: Some Days     Packs/day: 0.50     Pack years: 0.00     Types: Cigarettes    Smokeless tobacco: Current    Tobacco comments:     smokes 2-3 cigaretes wekly   Substance and Sexual Activity    Alcohol use: Yes     Comment: occassionally    Drug use: No    Sexual activity: Not on file   Other Topics Concern    Not on file   Social History Narrative    Not on file     Social Determinants of Health     Financial Resource Strain: Not on file   Food Insecurity: Not on file    Transportation Needs: Not on file   Physical Activity: Not on file   Stress: Not on file   Social Connections: Not on file   Intimate Partner Violence: Not on file   Housing Stability: Not on file     Allergies:     No Known Allergies    Medications:     Metformin 1000 mg by mouth twice daily    Review of Systems:     Review of Systems   Constitutional:  Negative for chills and fever.   HENT:  Negative for congestion, hearing loss and sore throat.    Eyes:  Negative for blurred vision and double vision.   Respiratory:  Negative for cough, sputum production and wheezing.    Cardiovascular:  Negative for chest pain, palpitations, orthopnea and leg swelling.   Gastrointestinal:  Negative for abdominal pain, constipation, diarrhea, heartburn and vomiting.   Genitourinary:  Negative for dysuria, frequency and urgency.   Musculoskeletal:  Negative for falls and myalgias.   Skin: Negative.    Neurological:  Negative for dizziness, tremors, sensory change, speech change, focal weakness, seizures and weakness.   Endo/Heme/Allergies: Negative.    Psychiatric/Behavioral: Negative.       Physical Exam:     VITAL SIGNS     Temp:  [97.6 F (36.4 C)] 97.6 F (36.4 C)  Heart Rate:  [69-71] 71  Resp Rate:  [17-18] 17  BP: (84-118)/(46-57) 118/57  No data recorded  SpO2: 100 %  No intake or output data in the 24 hours ending 05/19/21 2208       Physical Exam  Vitals and nursing note reviewed.   Constitutional:       General: He is not in acute distress.     Appearance: Normal appearance. He is not toxic-appearing.   HENT:      Head: Normocephalic and atraumatic.      Nose: Nose normal.      Mouth/Throat:      Pharynx: Oropharynx is clear.   Eyes:      General: No visual field deficit or scleral icterus.     Extraocular Movements: Extraocular movements intact.      Conjunctiva/sclera: Conjunctivae normal.      Pupils: Pupils are equal, round, and reactive to light.   Neck:      Vascular: No  carotid bruit.   Cardiovascular:       Rate and Rhythm: Normal rate and regular rhythm.      Pulses: Normal pulses.      Heart sounds: Normal heart sounds.   Pulmonary:      Effort: Pulmonary effort is normal.      Breath sounds: Normal breath sounds.   Abdominal:      General: Bowel sounds are normal. There is no distension.      Palpations: Abdomen is soft.      Tenderness: There is no abdominal tenderness.   Musculoskeletal:      Cervical back: Neck supple.      Right lower leg: No edema.      Left lower leg: No edema.   Skin:     General: Skin is warm.      Coloration: Skin is not jaundiced.      Findings: No bruising or lesion.   Neurological:      Mental Status: He is oriented to person, place, and time. Mental status is at baseline.      GCS: GCS eye subscore is 4. GCS verbal subscore is 5. GCS motor subscore is 6.      Cranial Nerves: Cranial nerves are intact. No cranial nerve deficit, dysarthria or facial asymmetry.      Sensory: Sensation is intact.      Motor: Motor function is intact. No weakness.      Coordination: Romberg sign negative. Finger-Nose-Finger Test normal.      Gait: Gait normal.   Psychiatric:         Mood and Affect: Mood normal.         Behavior: Behavior normal.         Thought Content: Thought content normal.     Laboratory Results:     CHEMISTRY:   Recent Labs   Lab 05/19/21  1941   Glucose 97   BUN 22.0   Creatinine 1.4*   Calcium 9.2   Sodium 139   Potassium 4.7   Chloride 108   CO2 22   Albumin 3.5   AST (SGOT) 18   ALT 14   Bilirubin, Total 0.5   Alkaline Phosphatase 75       Recent Labs   Lab 05/19/21  1941   Troponin I <0.01     HEMATOLOGY:  Recent Labs   Lab 05/19/21  1941   WBC 8.14   Hgb 12.1*   Hematocrit 36.2*   MCV 95.5   MCH 31.9   MCHC 33.4   Platelets 224     Radiology Reports:   Radiological Procedure reviewed.  Radiology Results (24 Hour)       Procedure Component Value Units Date/Time    CT Abd/Pelvis without Contrast [[id number]] Collected: 05/19/21 2041    Order Status: Completed Updated: 05/19/21 2053     Narrative:       CT ABDOMEN PELVIS WO IV/ WO PO CONT    CLINICAL HISTORY: Abdominal pain. abd pain , syncope    TECHNIQUE: Axial computed tomography of the abdomen was obtained from  the dome of the diaphragm to the iliac crests. Then, axial spiral CT of  the pelvis was obtained from the iliac crests to the symphysis pubis.  Intravenous contrast was not utilized. Oral contrast was not utilized.  This protocol was used for the evaluation of clinically suspected renal  stones. Lack of IV contrast makes assessment of abdominal and pelvic  structures suboptimal. Lack of oral  contrast makes assessment of bowel  and some adjacent structures suboptimal.  CT Dose reduction technique: One or more the following dose reduction  techniques were utilized: Automated exposure control; Adjustment of the  MVA and/or KVP according to patient's size; Use of the iterative  reconstruction technique.    COMPARISON: 04/29/2016     FINDINGS: No gross abnormality in the visualized liver and spleen. Aorta  and IVC are not enlarged. Pa2ncreas shows atrophy. Gallbladder is  contracted. Adrenals are not enlarged. No significant adenopathy. Bowel  loops are nondilated. No focal collection or mass.  Stool retention in the rectosigmoid. A few scattered diverticula a  noted.  No active diverticulitis.    Both kidneys show normal contour. No hydronephrosis.     Bladder appears contracted with mild wall thickening which is a stable  finding.. Vessels are nondilated. Bowel loops in the pelvis are  nondilated. No destructive bone process in the pelvis. Enlarged prostate  gland, measuring 5.9 cm, stable.      Impression:       DIVERTICULOSIS. PROSTRATE GLAND ENLARGEMENT. NO  HYDRONEPHROSIS. NO BOWEL OBSTRUCTION. MILD STOOL RETENTION.    Darnelle Maffucci, MD   05/19/2021 8:51 PM    CT Head WO Contrast [[id number]] Collected: 05/19/21 2039    Order Status: Completed Updated: 05/19/21 2043    Narrative:      CT HEAD WO CONTRAST    CLINICAL INDICATION: Mild  headache. Syncope, recurrent    TECHNIQUE:  Noncontrast CT scan of the head was performed. Axial images  were obtained. Sagittal and coronal MPR reformatting performed.  CT Dose reduction technique: One or more the following dose reduction  techniques were utilized: Automated exposure control; Adjustment of the  MVA and/or KVP according to patient's size; Use of the iterative  reconstruction technique.    COMPARISON: 04/13/2021     FINDINGS:    Atrophy noted, appearing stable. The ventricles and cisterns are clear.  No acute bleed. No acute cortical ischemic abnormality. No mass effect  or midline shift. No gross abnormality in the posterior fossa; beam  hardening artifacts are seen.  No evidence for an acute intracranial abnormality.        Impression:          NO ACUTE INTRACRANIAL ABNORMALITY.    Darnelle Maffucci, MD   05/19/2021 8:41 PM    Chest AP Portable [[id number]] Collected: 05/19/21 2019    Order Status: Completed Updated: 05/19/21 2021    Narrative:      XR CHEST AP PORTABLE    CLINICAL INDICATION: Chest Pain     TECHNIQUE: The following radiographs obtained per protocol:XR CHEST AP  PORTABLE    COMPARISON: 04/13/2021    FINDINGS:    Cardiomediastinal silhouette is within normal limits. Mild left base  scarring, unchanged. No infiltrate. No effusion no pulmonary edema. No  acute abnormalities are apparent. No active disease.      Impression:          No acute abnormality.    Darnelle Maffucci, MD   05/19/2021 8:19 PM            Orders Placed This Encounter   Procedures    Chest AP Portable    CT Head WO Contrast    CT Abd/Pelvis without Contrast    CBC and differential    Comprehensive metabolic panel    Troponin I    GFR    Vital Signs    ECG 12 Lead    Saline lock IV  Signed by: Willey Blade, MD, M.D.  Phone : [phone]  Spectralink : [phone]    *This note was generated by the Epic EMR system/ Dragon speech recognition and may contain inherent errors or omissions not intended by the user.  Grammatical errors, random word insertions, deletions, pronoun errors and incomplete sentences are occasional consequences of this technology due to software limitations. Not all errors are caught or corrected. If there are questions or concerns about the content of this note or information contained within the body of this dictation they should be addressed directly with the author for clarification.

## 2021-05-20 LAB — BASIC METABOLIC PANEL
Anion Gap: 10 (ref 5.0–15.0)
Anion Gap: 8 (ref 5.0–15.0)
BUN: 23 mg/dL (ref 9.0–28.0)
BUN: 19 mg/dL (ref 9.0–28.0)
CO2: 24 mEq/L (ref 22–29)
CO2: 25 mEq/L (ref 22–29)
Calcium: 9.5 mg/dL (ref 7.9–10.2)
Calcium: 8.8 mg/dL (ref 7.9–10.2)
Chloride: 106 mEq/L (ref 100–111)
Chloride: 109 mEq/L (ref 100–111)
Creatinine: 1.3 mg/dL (ref 0.7–1.3)
Creatinine: 1 mg/dL (ref 0.7–1.3)
Glucose: 107 mg/dL — ABNORMAL HIGH (ref 70–100)
Glucose: 110 mg/dL — ABNORMAL HIGH (ref 70–100)
Potassium: 5.3 mEq/L — ABNORMAL HIGH (ref 3.5–5.1)
Potassium: 4.4 mEq/L (ref 3.5–5.1)
Sodium: 140 mEq/L (ref 136–145)
Sodium: 142 mEq/L (ref 136–145)

## 2021-05-20 LAB — GLUCOSE WHOLE BLOOD - POCT
Whole Blood Glucose POCT: 164 mg/dL — ABNORMAL HIGH (ref 70–100)
Whole Blood Glucose POCT: 170 mg/dL — ABNORMAL HIGH (ref 70–100)

## 2021-05-20 LAB — ECG 12-LEAD
Atrial Rate: 67 {beats}/min
IHS MUSE NARRATIVE AND IMPRESSION: NORMAL
P Axis: 50 degrees
P-R Interval: 184 ms
Q-T Interval: 400 ms
QRS Duration: 82 ms
QTC Calculation (Bezet): 422 ms
R Axis: 49 degrees
T Axis: 56 degrees
Ventricular Rate: 67 {beats}/min

## 2021-05-20 LAB — TROPONIN I
Troponin I: <0.01 ng/mL (ref 0.00–0.05)
Troponin I: <0.01 ng/mL (ref 0.00–0.05)

## 2021-05-20 LAB — GFR
EGFR: >60
EGFR: >60

## 2021-05-20 MED ORDER — DEXTROSE 5% IV BOLUS
250.0000 mL | INTRAVENOUS | Status: DC | PRN
Start: 2021-05-20 — End: 2021-05-22

## 2021-05-20 MED ORDER — ACETAMINOPHEN 325 MG PO TABS
650.0000 mg | ORAL_TABLET | Freq: Four times a day (QID) | ORAL | Status: DC | PRN
Start: 2021-05-20 — End: 2021-05-22

## 2021-05-20 MED ORDER — MAGNESIUM SULFATE IN D5W 1-5 GM/100ML-% IV SOLN
1.0000 g | INTRAVENOUS | Status: DC | PRN
Start: 2021-05-20 — End: 2021-05-22

## 2021-05-20 MED ORDER — DEXTROSE 10 % IV BOLUS
25.0000 g | INTRAVENOUS | Status: DC | PRN
Start: 2021-05-20 — End: 2021-05-22

## 2021-05-20 MED ORDER — DEXTROSE 50 % IV SOLN
25.0000 g | INTRAVENOUS | Status: DC | PRN
Start: 2021-05-20 — End: 2021-05-22
  Filled 2021-05-20: qty 50

## 2021-05-20 MED ORDER — NALOXONE HCL 0.4 MG/ML IJ SOLN (WRAP)
0.2000 mg | INTRAMUSCULAR | Status: DC | PRN
Start: 2021-05-20 — End: 2021-05-22

## 2021-05-20 MED ORDER — ENOXAPARIN SODIUM 40 MG/0.4ML IJ SOSY
40.0000 mg | PREFILLED_SYRINGE | Freq: Every day | INTRAMUSCULAR | Status: DC
Start: 2021-05-20 — End: 2021-05-22
  Administered 2021-05-20 – 2021-05-22 (×3): 40 mg via SUBCUTANEOUS
  Filled 2021-05-20 (×3): qty 0.4

## 2021-05-20 MED ORDER — ONDANSETRON 4 MG PO TBDP
4.0000 mg | ORAL_TABLET | Freq: Four times a day (QID) | ORAL | Status: DC | PRN
Start: 2021-05-20 — End: 2021-05-22

## 2021-05-20 MED ORDER — GLUCAGON 1 MG IJ SOLR (WRAP)
1.0000 mg | INTRAMUSCULAR | Status: DC | PRN
Start: 2021-05-20 — End: 2021-05-22

## 2021-05-20 MED ORDER — MELATONIN 3 MG PO TABS
3.0000 mg | ORAL_TABLET | Freq: Every evening | ORAL | Status: DC | PRN
Start: 2021-05-20 — End: 2021-05-22

## 2021-05-20 MED ORDER — DEXTROSE-SODIUM CHLORIDE 5-0.45 % IV SOLN
INTRAVENOUS | Status: AC
Start: 2021-05-20 — End: 2021-05-21

## 2021-05-20 MED ORDER — ONDANSETRON HCL 4 MG/2ML IJ SOLN
4.0000 mg | Freq: Four times a day (QID) | INTRAMUSCULAR | Status: DC | PRN
Start: 2021-05-20 — End: 2021-05-22

## 2021-05-20 MED ORDER — DEXTROSE 50 % IV SOLN
25.0000 g | INTRAVENOUS | Status: DC | PRN
Start: 2021-05-20 — End: 2021-05-22

## 2021-05-20 MED ORDER — POTASSIUM CHLORIDE 10 MEQ/100ML IV SOLN
10.0000 meq | INTRAVENOUS | Status: DC | PRN
Start: 2021-05-20 — End: 2021-05-22

## 2021-05-20 MED ORDER — POTASSIUM & SODIUM PHOSPHATES 280-160-250 MG PO PACK
2.0000 | PACK | ORAL | Status: DC | PRN
Start: 2021-05-20 — End: 2021-05-22
  Filled 2021-05-20: qty 2

## 2021-05-20 MED ORDER — ACETAMINOPHEN 650 MG RE SUPP
650.0000 mg | Freq: Four times a day (QID) | RECTAL | Status: DC | PRN
Start: 2021-05-20 — End: 2021-05-22

## 2021-05-20 MED ORDER — POTASSIUM CHLORIDE CRYS ER 20 MEQ PO TBCR
0.0000 meq | EXTENDED_RELEASE_TABLET | ORAL | Status: DC | PRN
Start: 2021-05-20 — End: 2021-05-22

## 2021-05-20 MED ORDER — INSULIN LISPRO 100 UNIT/ML SOLN (WRAP)
1.0000 [IU] | Freq: Every evening | Status: DC
Start: 2021-05-20 — End: 2021-05-22
  Administered 2021-05-21: 2 [IU] via SUBCUTANEOUS
  Filled 2021-05-20: qty 6

## 2021-05-20 MED ORDER — CALCIUM CARBONATE ANTACID 500 MG PO CHEW
1000.0000 mg | CHEWABLE_TABLET | Freq: Four times a day (QID) | ORAL | Status: DC | PRN
Start: 2021-05-20 — End: 2021-05-22

## 2021-05-20 MED ORDER — INSULIN LISPRO 100 UNIT/ML SOLN (WRAP)
1.0000 [IU] | Freq: Three times a day (TID) | Status: DC
Start: 2021-05-20 — End: 2021-05-22

## 2021-05-20 NOTE — UM Notes (Signed)
05/19/21 2239  Admit to Observation  Once        Diagnosis: Syncope And Collapse    Level of Care: Intermediate Care    Patient Class: Observation            MEDICARE/MEDICARE PART A AND B  Primary Coverage  Auth Number   --       MEDICAID HMO/[employer]  Secondary Coverage  Auth Number   --     H&P: 87 y.o. male with past medical history remarkable for diabetes mellitus, history of GI bleeding, anemia, benign prostatic hypertrophy who presented to [facility] emergency room after a syncopal episode.  Patient was going out to eat with his family and was getting out of the car when he felt lightheaded and passed out.  He denied any head injury or loss of consciousness.  He has no antecedent symptoms like palpitations, chest pain or headache.  He stated that he had an alcoholic drink from [place] at home before he left home.  He thinks that he took it to be too much that made him elevated dizzy.     On presentation, his blood pressure was 84/46 mmHg, heart rate of 70/min, respiratory rate of 18/min, temperature 97.6 F and oxygen saturation of 90% on room air.  His labs showed normal CBC, elevated renal function with BUN of 22 and creatinine of 1.4 with otherwise normal labs.  First set of troponin was negative at 0.01.  Imaging studies with CT head showed no acute intracranial abnormality.  Chest x-ray showed no acute abnormality.  CT abdomen pelvis showed diverticulosis and prostate enlargement and mild stool retention.  He will be admitted for further evaluation of syncope.      Past Medical History:   Diagnosis Date    Diabetes mellitus     No diagnosis      Past Surgical History:   Procedure Laterality Date    APPENDECTOMY (OPEN)      EGD, BIOPSY N/A 06/30/2015    Procedure: EGD, BIOPSY;  Surgeon: [name], MD;  Location: [facility];  Service: Gastroenterology;  Laterality: N/A;    EGD, COLONOSCOPY N/A 08/04/2015    Procedure: EGD, COLONOSCOPY;  Surgeon: [name], MD;  Location:  [facility];  Service: Gastroenterology;  Laterality: N/A;         ED VS: on RA   Temp:  [97.6 F (36.4 C)-98.8 F (37.1 C)]   Heart Rate:  [60-71]   Resp Rate:  [17-18]   BP: (84-158)/(46-69)   SpO2:  [95 %-100 %]   Height:  [175.3 cm (5\' 9" )-182.9 cm (6')]   Weight:  [61.3 kg (135 lb 3.2 oz)-66.2 kg (145 lb 15.1 oz)]   BMI (calculated):  [18.9-21.6]       ED Labs:  Results       Procedure Component Value Units Date/Time    Basic Metabolic Panel [[id number]]  (Abnormal) Collected: 05/20/21 0946    Specimen: Blood Updated: 05/20/21 1015     Glucose 110 mg/dL      BUN 40.9 mg/dL      Creatinine 1.0 mg/dL      Calcium 8.8 mg/dL      Sodium 811 mEq/L      Potassium 4.4 mEq/L      Chloride 109 mEq/L      CO2 25 mEq/L      Anion Gap 8.0    GFR [[id number]] Collected: 05/20/21 0946     Updated: 05/20/21 1015  EGFR >60.0    Troponin I [[id number]] Collected: 05/20/21 0427    Specimen: Blood Updated: 05/20/21 0536     Troponin I <0.01 ng/mL     Troponin I [[id number]] Collected: 05/20/21 0123    Specimen: Blood Updated: 05/20/21 0203     Troponin I <0.01 ng/mL     Basic Metabolic Panel [[id number]]  (Abnormal) Collected: 05/20/21 0123    Specimen: Blood Updated: 05/20/21 0158     Glucose 107 mg/dL      BUN 08.6 mg/dL      Creatinine 1.3 mg/dL      Calcium 9.5 mg/dL      Sodium 578 mEq/L      Potassium 5.3 mEq/L      Chloride 106 mEq/L      CO2 24 mEq/L      Anion Gap 10.0    GFR [[id number]] Collected: 05/20/21 0123     Updated: 05/20/21 0158     EGFR >60.0    COVID-19 (SARS-COV-2) ([place] [facility]) [[id number]] Collected: 05/19/21 2300    Specimen: Nasopharyngeal Swab from Nasopharynx Updated: 05/19/21 2341     Purpose of COVID testing Screening     SARS-CoV-2 Specimen Source Nasopharyngeal     SARS CoV-2 Negative    Narrative:      o Collect and clearly label specimen type:  o Upper respiratory specimen: One Nasopharyngeal Dry Swab NO  Transport Media.  o Hand deliver to laboratory ASAP  Indication for testing->Extended care  facility admission to  semi private room  Screening    Troponin I [[id number]] Collected: 05/19/21 1941    Specimen: Blood Updated: 05/19/21 2007     Troponin I <0.01 ng/mL     GFR [[id number]] Collected: 05/19/21 1941     Updated: 05/19/21 2004     EGFR 58.0    Comprehensive metabolic panel [[id number]]  (Abnormal) Collected: 05/19/21 1941    Specimen: Blood Updated: 05/19/21 2004     Glucose 97 mg/dL      BUN 29.5 mg/dL      Creatinine 1.4 mg/dL      Sodium 188 mEq/L      Potassium 4.7 mEq/L      Chloride 108 mEq/L      CO2 22 mEq/L      Calcium 9.2 mg/dL      Protein, Total 6.7 g/dL      Albumin 3.5 g/dL      AST (SGOT) 18 U/L      ALT 14 U/L      Alkaline Phosphatase 75 U/L      Bilirubin, Total 0.5 mg/dL      Globulin 3.2 g/dL      Albumin/Globulin Ratio 1.1     Anion Gap 9.0    CBC and differential [[id number]]  (Abnormal) Collected: 05/19/21 1941    Specimen: Blood Updated: 05/19/21 1950     WBC 8.14 x10 3/uL      Hgb 12.1 g/dL      Hematocrit 60.1 %      Platelets 224 x10 3/uL      RBC 3.79 x10 6/uL      MCV 95.5 fL      MCH 31.9 pg      MCHC 33.4 g/dL      RDW 14 %      MPV 9.8 fL      Neutrophils 76.5 %      Lymphocytes Automated 14.5 %      Monocytes 5.5 %  Eosinophils Automated 2.9 %      Basophils Automated 0.5 %      Immature Granulocytes 0.1 %      Nucleated RBC 0.0 /100 WBC      Neutrophils Absolute 6.22 x10 3/uL      Lymphocytes Absolute Automated 1.18 x10 3/uL      Monocytes Absolute Automated 0.45 x10 3/uL      Eosinophils Absolute Automated 0.24 x10 3/uL      Basophils Absolute Automated 0.04 x10 3/uL      Immature Granulocytes Absolute 0.01 x10 3/uL      Absolute NRBC 0.00 x10 3/uL               Imaging:       CT Abd/Pelvis without Contrast (Final result)  Result time 05/19/21 20:51:45  Final result by Rachelle Hora, MD (05/19/21 20:51:45)                Impression:     DIVERTICULOSIS. PROSTRATE GLAND ENLARGEMENT.              ED med given:   05/19/2021 2256 EDT aspirin tablet  325 mg 325 mg Oral Given       Assessment:      Syncope possibly related to earlier intake of alcohol  Orthostatic hypotension  Acute kidney injury  Type 2 diabetes mellitus  Benign prostatic hypertrophy  Hyperlipidemia     Plan:      Patient will be admitted to medical floor for observation.  Will be placed on telemetry and will monitor for arrhythmia.  Will check serial troponin to rule out MI  We will hydrate him with intravenous fluids overnight.  We will check his renal function test tomorrow.  Syncope may be related to the alcoholic drink that he had before he went out of his house  No neurological episode and patient appears to be at his baseline on examination  We will place him on sliding scale insulin coverage for his diabetes  DVT prophylaxis with SCDs and Lovenox        Scheduled Meds:  Current Facility-Administered Medications   Medication Dose Route Frequency    enoxaparin  40 mg Subcutaneous Daily     Continuous Infusions:   dextrose 5 % and 0.45% NaCl 100 mL/hr at 05/20/21 0106     PRN Meds:.acetaminophen **OR** acetaminophen, calcium carbonate, Nursing communication: Adult Hypoglycemia Treatment Algorithm **AND** glucagon (rDNA) **AND** dextrose **AND** dextrose **AND** dextrose, magnesium sulfate, melatonin, naloxone, ondansetron **OR** ondansetron, potassium & sodium phosphates, potassium chloride **AND** potassium chloride    No intake or output data in the 24 hours ending 05/20/21 9604     UTILIZATION REVIEW CONTACT: Earlene Plater, UR RN  Clinical case manager- Utilization review  Main UR#: [phone]  Direct #: [phone]  Email: [email or web address]    Facility Tax ID & NPI     Tax ID NPI   [facility] [id number]   Shea Stakes [id number]   [place] [id number]   [place] [id number] - NEW;   [id number] - OLD;   Mackie Pai  [phone]       NOTES TO REVIEWER:     This clinical review is based on/compiled from documentation provided by the  treatment team within the patients medical record.

## 2021-05-20 NOTE — Progress Notes (Signed)
FOUR EYES SKIN ASSESSMENT NOTE    [name]  1932-07-01  [id number]    Braden Scale Score: 21    POC Initiated for Risk for Altered Skin Yes    Patient Assessed for Correct Mattress Surface Yes    Mepilex or Adhesive Foam Dressing applied to sacrum/heel if any PI risk factors present: Yes    Wound/PI assessment documented in EHR: Yes    Admitting physician notified: Yes    Wound consult ordered: Yes    Marko Stai, RN  May 20, 2021  6:06 PM    Second PCT Name: [name]

## 2021-05-20 NOTE — Consults (Signed)
NEUROLOGY CONSULTATION    Date Time: 05/20/21 8:09 AM  Patient Name: [name]  Attending Physician: Sharlette Dense, MD      Assessment & Plan:   Syncope - likely due to dehydration, ETOH intake - BP as low as 80's systolic when in the ED.  Appears mentally intact at this time.    Check orthostatics.  Hydration  PT/OT    History of Present Illness:   87 yo mal with DM, Hx ischemic stroke, Hx GI bleed, HLD, DM who reports getting out of his car and suffering from syncopal event.  He indicates at first feeling LH.  IN the ED he had BP as low as 84/46.  He had had ETOH prior to this event.  Pt says he feels well now.  He denies headaches or weakness or numbness.    Past Medical History:     Past Medical History:   Diagnosis Date    Diabetes mellitus     No diagnosis        Meds:      Scheduled Meds: PRN Meds:    enoxaparin, 40 mg, Subcutaneous, Daily        Continuous Infusions:   dextrose 5 % and 0.45% NaCl 100 mL/hr at 05/20/21 0106    acetaminophen, 650 mg, Q6H PRN   Or  acetaminophen, 650 mg, Q6H PRN  calcium carbonate, 1,000 mg, Q6H PRN  glucagon (rDNA), 1 mg, PRN   And  dextrose, 250 mL, PRN   And  dextrose, 25 g, PRN   And  dextrose, 25 g, PRN  magnesium sulfate, 1 g, PRN  melatonin, 3 mg, QHS PRN  naloxone, 0.2 mg, PRN  ondansetron, 4 mg, Q6H PRN   Or  ondansetron, 4 mg, Q6H PRN  potassium & sodium phosphates, 2 packet, PRN  potassium chloride, 0-40 mEq, PRN   And  potassium chloride, 10 mEq, PRN          I personally reviewed all of the medications.  Medication list generated using all available resources.  Elder abuse (physical)  - negative  Advanced care plan - reviewed from chart or in discussion with pt or family    No Known Allergies    Social & Family History:     Social History     Socioeconomic History    Marital status: Single   Tobacco Use    Smoking status: Some Days     Packs/day: 0.50     Pack years: 0.00     Types: Cigarettes    Smokeless tobacco: Current    Tobacco comments:     smokes  2-3 cigaretes wekly   Substance and Sexual Activity    Alcohol use: Yes     Comment: occassionally    Drug use: No     No family history on file.    Review of Systems:   No eye, ear nose, throat problems; no coughing or wheezing or shortness of breath, No chest pain or orthopnea, no abdominal pain, nausea or vomiting, No pain in the body or extremities, no psychiatric, neurological, endocrine, hematological or cardiac complaints except as noted above.     Physical Exam:   Blood pressure 139/69, pulse 73, temperature 97.7 F (36.5 C), temperature source Oral, resp. rate 12, height 1.803 m (5\' 11" ), weight 61.3 kg (135 lb 3.2 oz), SpO2 100 %.    HEENT: Normocephalic. Non-icter, no congestion, no carotid bruits  Lungs:  CTA bil  Cardiac:  S1,S2, normal rate and rhythm  Neck: supple, no lymphadenopathy, no thyromegaly, no JVD, no cartoid bruits  Extremities: no clubbing, cyanosis, or edema  Skin: no rashes or lesions noted    Neuro:  Level of consciousness:  Alert and appropriate  Oriented:  X 3  Cognition:  Intact naming, recognition, concentration and following complex commands  Cranial Nerves:  II-XII intact  Strength:  No upper extremity drift, 5/5 strength x 4 extremities  Coordination:  Intact FTN testing  Reflexes:  +1 throughout, down going toes bil  Sensation: Intact x 4 extremities to LT  Gait:  Deferred     Labs:     Recent Labs   Lab 05/20/21  0123 05/19/21  1941   Glucose 107* 97   BUN 23.0 22.0   Creatinine 1.3 1.4*   Calcium 9.5 9.2   Sodium 140 139   Potassium 5.3* 4.7   Chloride 106 108   CO2 24 22   Albumin  --  3.5   AST (SGOT)  --  18   ALT  --  14   Bilirubin, Total  --  0.5   Alkaline Phosphatase  --  75     Recent Labs   Lab 05/19/21  1941   WBC 8.14   Hgb 12.1*   Hematocrit 36.2*   MCV 95.5   MCH 31.9   MCHC 33.4   Platelets 224         No results for input(s): PTT, PT, INR in the last 72 hours.       Radiology Results (24 Hour)       Procedure Component Value Units Date/Time    CT Abd/Pelvis  without Contrast [[id number]] Collected: 05/19/21 2041    Order Status: Completed Updated: 05/19/21 2053    Narrative:       CT ABDOMEN PELVIS WO IV/ WO PO CONT    CLINICAL HISTORY: Abdominal pain. abd pain , syncope    TECHNIQUE: Axial computed tomography of the abdomen was obtained from  the dome of the diaphragm to the iliac crests. Then, axial spiral CT of  the pelvis was obtained from the iliac crests to the symphysis pubis.  Intravenous contrast was not utilized. Oral contrast was not utilized.  This protocol was used for the evaluation of clinically suspected renal  stones. Lack of IV contrast makes assessment of abdominal and pelvic  structures suboptimal. Lack of oral contrast makes assessment of bowel  and some adjacent structures suboptimal.  CT Dose reduction technique: One or more the following dose reduction  techniques were utilized: Automated exposure control; Adjustment of the  MVA and/or KVP according to patient's size; Use of the iterative  reconstruction technique.    COMPARISON: 04/29/2016     FINDINGS: No gross abnormality in the visualized liver and spleen. Aorta  and IVC are not enlarged. Pa2ncreas shows atrophy. Gallbladder is  contracted. Adrenals are not enlarged. No significant adenopathy. Bowel  loops are nondilated. No focal collection or mass.  Stool retention in the rectosigmoid. A few scattered diverticula a  noted.  No active diverticulitis.    Both kidneys show normal contour. No hydronephrosis.     Bladder appears contracted with mild wall thickening which is a stable  finding.. Vessels are nondilated. Bowel loops in the pelvis are  nondilated. No destructive bone process in the pelvis. Enlarged prostate  gland, measuring 5.9 cm, stable.      Impression:       DIVERTICULOSIS. PROSTRATE GLAND ENLARGEMENT. NO  HYDRONEPHROSIS. NO BOWEL OBSTRUCTION. MILD  STOOL RETENTION.    Darnelle Maffucci, MD   05/19/2021 8:51 PM    CT Head WO Contrast [[id number]] Collected: 05/19/21 2039    Order Status:  Completed Updated: 05/19/21 2043    Narrative:      CT HEAD WO CONTRAST    CLINICAL INDICATION: Mild headache. Syncope, recurrent    TECHNIQUE:  Noncontrast CT scan of the head was performed. Axial images  were obtained. Sagittal and coronal MPR reformatting performed.  CT Dose reduction technique: One or more the following dose reduction  techniques were utilized: Automated exposure control; Adjustment of the  MVA and/or KVP according to patient's size; Use of the iterative  reconstruction technique.    COMPARISON: 04/13/2021     FINDINGS:    Atrophy noted, appearing stable. The ventricles and cisterns are clear.  No acute bleed. No acute cortical ischemic abnormality. No mass effect  or midline shift. No gross abnormality in the posterior fossa; beam  hardening artifacts are seen.  No evidence for an acute intracranial abnormality.        Impression:          NO ACUTE INTRACRANIAL ABNORMALITY.    Darnelle Maffucci, MD   05/19/2021 8:41 PM    Chest AP Portable [[id number]] Collected: 05/19/21 2019    Order Status: Completed Updated: 05/19/21 2021    Narrative:      XR CHEST AP PORTABLE    CLINICAL INDICATION: Chest Pain     TECHNIQUE: The following radiographs obtained per protocol:XR CHEST AP  PORTABLE    COMPARISON: 04/13/2021    FINDINGS:    Cardiomediastinal silhouette is within normal limits. Mild left base  scarring, unchanged. No infiltrate. No effusion no pulmonary edema. No  acute abnormalities are apparent. No active disease.      Impression:          No acute abnormality.    Darnelle Maffucci, MD   05/19/2021 8:19 PM             All recent brain and spine imaging (MRI, CT) results reviewed.    Chart reviewed    Code status confirmed    Case discussed with: patient and Dr. Thelma Barge    40 minutes;  involving time spent examining patient, in counseling or coordination of care, reviewing test results, and in documentation.    Signed by: Ardelle Anton, MD  Spectralink: [phone]       Answering Service: [phone]

## 2021-05-20 NOTE — Nursing Progress Note (Signed)
FOUR EYES SKIN ASSESSMENT NOTE    [name]  1932/05/21  [id number]    Braden Scale Score: 21    POC Initiated for Risk for Altered Skin    No    Patient Assessed for Correct Mattress Surface     YES    *At risk patients with Braden Score less than 12 must be considered for specialty bed    Mepilex or Adhesive Foam Dressing applied to sacrum/heel if any PI risk factors present:     No pt is independent      If Wound/Pressure Injury present:  Wound/PI assessment documented in LDA (will be shown below): No    Admitting physician notified: No  Wound consult ordered: No      Was skin checked with incoming RN/PCT? yes    Creig Hines, RN  May 20, 2021  6:49 AM    Second RN/PCT Name:[place]

## 2021-05-20 NOTE — Progress Notes (Addendum)
PROGRESS NOTE    Date Time: 05/20/21 4:02 PM  Patient Name: [name], [name]  Patient status: Observation  Hospital Day: 0      Assessment:   Syncope due to dehydration, orthostatic hypotension - CT Head Neg   Orthostatic hypotension  Acute kidney injury-resolved  Hyperkalemia corrected   Diabetes type 2  BPH  Hyperlipidemia  History of GI bleeding   Full code      Plan:     No further events , neurologically intact  Hydration-orthostatic checks  Blood sugar controlled , holding metformin  No urine symptoms  Fasting lipids  H&H stable no active bleeding  DVT prophylaxis  OT PT , Activity as tolerated  DCP When stable     Continue rest of treatment.  Discussed plan with patient & Nurse   Labs & data reviewed   Dw Dr.Gereyesus   Subjective:   Patient seen and examined :  No further events of dizziness or losing consciousness  Orthostatics positive  Review of Systems:   General ROS: no fever no chills,  CVS ROS: no CP, no palpitation, no orthopnea, no PND  RESP ROS: no SOB, no cough, no sputum  GI ROS: no N/V, no diarrhea, no constipation, no melena, no hematochezia  GU ROS: no dysuria, no frequency, no hematuria, no flank pain  NEURO ROS: no focal weakness, no headache, no paraesthesia  MSK ROS: no limitation of movement, no joint pain, no swelling, no leg deformity    Medications:     Current Facility-Administered Medications   Medication Dose Route Frequency    enoxaparin  40 mg Subcutaneous Daily      dextrose 5 % and 0.45% NaCl 100 mL/hr at 05/20/21 0934     acetaminophen **OR** acetaminophen, calcium carbonate, Nursing communication: Adult Hypoglycemia Treatment Algorithm **AND** glucagon (rDNA) **AND** dextrose **AND** dextrose **AND** dextrose, magnesium sulfate, melatonin, naloxone, ondansetron **OR** ondansetron, potassium & sodium phosphates, potassium chloride **AND** potassium chloride    Physical Exam:   BP 135/68    Pulse 72    Temp 98.1 F (36.7 C) (Oral)    Resp 14    Ht 1.803 m (5\' 11" )    Wt 61.3 kg  (135 lb 3.2 oz)    SpO2 100%    BMI 18.86 kg/m     Intake and Output Summary (Last 24 hours) at Date Time    Intake/Output Summary (Last 24 hours) at 05/20/2021 1602  Last data filed at 05/20/2021 1000  Gross per 24 hour   Intake 730 ml   Output 200 ml   Net 530 ml       General: awake, alert, oriented x 3; no acute distress.  Neck : supple, no JVD  ENT: PERRLA, EOMI   Lungs: clear to auscultation bilaterally, without wheezing  Cardiovascular: regular rate and rhythm, no gallops  Abdomen: soft, non-tender, non-distended; bowel sounds positive  Extremities: no clubbing, cyanosis, or edema  GU: no CVA tenderness   Neuro: CN II-XII intact, no gross focal deficit   Gait: Stable    Labs:     CBC w/Diff CMP   Recent Labs   Lab 05/19/21  1941   WBC 8.14   Hgb 12.1*   Hematocrit 36.2*   Platelets 224   MCV 95.5   Neutrophils 76.5       PT/INR         Recent Labs   Lab 05/20/21  0946 05/20/21  0123 05/19/21  1941   Sodium 142 140 139  Potassium 4.4 5.3* 4.7   Chloride 109 106 108   CO2 25 24 22    BUN 19.0 23.0 22.0   Creatinine 1.0 1.3 1.4*   Glucose 110* 107* 97   Calcium 8.8 9.5 9.2   Protein, Total  --   --  6.7   Albumin  --   --  3.5   AST (SGOT)  --   --  18   ALT  --   --  14   Alkaline Phosphatase  --   --  75   Bilirubin, Total  --   --  0.5      Glucose POCT   Recent Labs   Lab 05/20/21  0946 05/20/21  0123 05/19/21  1941   Glucose 110* 107* 97        Recent Labs   Lab 05/20/21  0427 05/20/21  0123 05/19/21  1941   Troponin I <0.01 <0.01 <0.01       Rads:   CT Abd/Pelvis without Contrast    Result Date: 05/19/2021   DIVERTICULOSIS. PROSTRATE GLAND ENLARGEMENT. NO HYDRONEPHROSIS. NO BOWEL OBSTRUCTION. MILD STOOL RETENTION. Darnelle Maffucci, MD  05/19/2021 8:51 PM    CT Head WO Contrast    Result Date: 05/19/2021      NO ACUTE INTRACRANIAL ABNORMALITY. Darnelle Maffucci, MD  05/19/2021 8:41 PM    Chest AP Portable    Result Date: 05/19/2021      No acute abnormality. Darnelle Maffucci, MD  05/19/2021 8:19 PM       Lumina Gitto Thelma Barge,  MD  05/20/2021    *This note was generated by the [employer] EMR system/ Dragon speech recognition and may contain inherent errors or omissions not intended by the user. Grammatical errors, random word insertions, deletions, pronoun errors and incomplete sentences are occasional consequences of this technology due to software limitations. Not all errors are caught or corrected. If there are questions or concerns about the content of this note or information contained within the body of this dictation they should be addressed directly with the author for clarification

## 2021-05-20 NOTE — Plan of Care (Signed)
Patient is alert x 4, VSS, neuro checks WNL.  Denies any pain/ SOB at this time. Orthostatic BP positive.  Attending notified. Pt is asymptomatic. Already on IVF.  Fall precautions in place.     Problem: Hemodynamic Status: Cardiac  Goal: Stable vital signs and fluid balance  Outcome: Progressing  Flowsheets (Taken 05/20/2021 1201)  Stable vital signs and fluid balance:   Monitor/assess vital signs and telemetry per unit protocol   Monitor intake/output per unit protocol and/or LIP order   Weigh on admission and record weight daily   Assess signs and symptoms associated with cardiac rhythm changes     Problem: Inadequate Tissue Perfusion  Goal: Adequate tissue perfusion will be maintained  Outcome: Progressing  Flowsheets (Taken 05/20/2021 1201)  Adequate tissue perfusion will be maintained:   Monitor/assess vital signs   Monitor/assess lab values and report abnormal values   Monitor/assess neurovascular status (pulses, capillary refill, pain, paresthesia, paralysis, presence of edema)

## 2021-05-20 NOTE — Plan of Care (Signed)
Pt was admitted last night. He is Aox4 on room air, nsr on tele. Patient reports he is no longer dizzy. His troponin is less then 0.01 x3. Pt reports no chest pain or shortness of breath at this time. He is receiving dextrose 5 % and 0.45 % Nacl at 100 ml/hr. K was 5.3 on lab draw and attending was made aware. He is ambulatory to the bathroom with 1 assist. Pt safety was maintained and hourly rounding done.      Problem: Moderate/High Fall Risk Score >5  Goal: Patient will remain free of falls  Outcome: Progressing  Flowsheets (Taken 05/20/2021 0350)  Moderate Risk (6-13):   MOD-Floor mat at bedside (where available) if appropriate   MOD-Remain with patient during toileting   MOD-Re-orient confused patients   MOD-Perform dangle, stand, walk (DSW) prior to mobilization   MOD-Use gait belt when appropriate   MOD-Consider a move closer to Nurses Station   MOD-Place bedside commode and assistive devices out of sight when not in use     Problem: Day of Admission - Stroke  Goal: Core/Quality measure requirements - Admission  Outcome: Progressing  Flowsheets (Taken 05/20/2021 0350)  Core/Quality measure requirements - Admission: Document NIH Stroke Scale on admission     Problem: Hemodynamic Status: Cardiac  Goal: Stable vital signs and fluid balance  Outcome: Progressing  Flowsheets (Taken 05/20/2021 0350)  Stable vital signs and fluid balance:   Monitor/assess vital signs and telemetry per unit protocol   Assess signs and symptoms associated with cardiac rhythm changes   Monitor intake/output per unit protocol and/or LIP order     Problem: Inadequate Tissue Perfusion  Goal: Adequate tissue perfusion will be maintained  Outcome: Progressing  Flowsheets (Taken 05/20/2021 0350)  Adequate tissue perfusion will be maintained:   Monitor/assess vital signs   Monitor/assess lab values and report abnormal values   Monitor/assess neurovascular status (pulses, capillary refill, pain, paresthesia, paralysis, presence of edema)   Monitor  intake and output     Problem: Ineffective Gas Exchange  Goal: Effective breathing pattern  Outcome: Progressing     Problem: Nutrition  Goal: Nutritional intake is adequate  Outcome: Progressing  Flowsheets (Taken 05/20/2021 0350)  Nutritional intake is adequate:   Monitor daily weights   Assist patient with meals/food selection

## 2021-05-20 NOTE — PT Eval Note (Addendum)
[facility]  Physical Therapy Evaluation and Treatment    Patient: [name]  MRN#: [id number]  Unit: [facility]  Bed: W1191/Y7829-F    Time of Evaluation and Treatment:  Time Calculation  PT Received On: 05/20/21  Start Time: 1137  Stop Time: 1156  Time Calculation (min): 19 min    Evaluation Time: 9 minutes  Treatment Time: 10 minutes    Chart Review and Collaboration with Care Team: 6 minutes, not included in above time    PT Visit Number: 1    Consult received for [name] for PT Evaluation and Treatment.  Patients medical condition is appropriate for Physical therapy intervention at this time.    ___________________________________________________    POST ACUTE CARE THERAPY RECOMMENDATIONS:   Discharge Recommendation: Home with no needs      DME Recommended for Discharge: No additional equipment/DME recommended at this time      ACUTE CARE THERAPY RECOMMENDATIONS:  Pt would benefit from Physical Therapy to address deficits and increase functional independence.     Is an Occupational Therapy Evaluation Indicated at this time? No, an acute care OT evaluation is not required at this time.      (Therapy recommendations are subject to change with patient status.  Please refer to the most recent PT/OT note for up-to-date recommendations.)  ___________________________________________________      Activity Orders:  PT eval and treat and progressive mobility protocol    Precautions and Contraindications:  Precautions  Weight Bearing Status: no restrictions  Other Precautions: falls    Personal Protective Equipment (PPE)  gloves, procedure mask, shoe covers, and pt wore procedure mask    Medical Diagnosis:  Syncope and collapse [R55]    History of Present Illness:  [name] is a 87 y.o. male admitted on 05/19/2021 with DM, Hx ischemic stroke, Hx GI bleed, HLD, DM who reports getting out of his car and suffering from syncopal event. He indicates at first feeling LH. In the ED he had  BP as low as 84/46. He had had ETOH prior to this event. Pt says he feels well now. He denies headaches or weakness or numbness.      Patient Active Problem List   Diagnosis    Lacunar infarction    Lightheadedness    Gastrointestinal hemorrhage with melena    Symptomatic anemia    Type 2 diabetes mellitus without complication    Mixed hyperlipidemia    Acute blood loss anemia    Syncope    Other specified hypotension    Syncope and collapse       Past Medical/Surgical History:  Past Medical History:   Diagnosis Date    Diabetes mellitus     No diagnosis      Past Surgical History:   Procedure Laterality Date    APPENDECTOMY (OPEN)      EGD, BIOPSY N/A 06/30/2015    Procedure: EGD, BIOPSY;  Surgeon: [name], MD;  Location: [facility];  Service: Gastroenterology;  Laterality: N/A;    EGD, COLONOSCOPY N/A 08/04/2015    Procedure: EGD, COLONOSCOPY;  Surgeon: [name], MD;  Location: [facility];  Service: Gastroenterology;  Laterality: N/A;       X-Rays/Tests/Labs:  Lab Results   Component Value Date/Time    HGB 12.1 (L) 05/19/2021 07:41 PM    HCT 36.2 (L) 05/19/2021 07:41 PM    HCT 25.2 08/05/2015 11:39 AM    K 4.4 05/20/2021 09:46 AM    NA 142  05/20/2021 09:46 AM    TROPI <0.01 05/20/2021 04:27 AM    TROPI <0.01 05/20/2021 01:23 AM    TROPI <0.01 05/19/2021 07:41 PM    TROPI <0.01 04/13/2021 02:16 PM       All imaging reviewed, please see chart for details.    Social History:  Prior Level of Function  Prior level of function: Ambulates independently, Independent with ADLs  Baseline Activity Level: Household ambulation, Community ambulation  Driving: independent  DME Currently at Home: Other (Comment) (none)    Home Living Arrangements  Living Arrangements: Alone  Type of Home: Apartment  Home Layout: One level, Elevator (0 STE)  Bathroom Shower/Tub: Medical sales representative: Standard  Bathroom Accessibility: Accessible  DME Currently at Home: Other (Comment) (none)  Home Living - Notes / Comments: Pt  denies any hx of falls      Subjective:  Patient is agreeable to participation in the therapy session. Nursing clears patient for therapy.     Pain Assessment  Pain Assessment: No/denies pain      Objective:  Observation of Patient/Vital Signs:    Vitals: supine in bed    05/20/21   BP: 130/75   Pulse: 75   Resp:    Temp:    SpO2: 100%         Inspection/Posture: Pt received supine in bed    Cognitive Status and Neuro Exam:  Cognition/Neuro Status  Arousal/Alertness: Appropriate responses to stimuli  Attention Span: Appears intact  Orientation Level: Oriented X4  Memory: Appears intact  Following Commands: Follows all commands and directions without difficulty  Safety Awareness: minimal verbal instruction  Insights: Fully aware of deficits;Educated in safety awareness  Problem Solving: minimal assistance  Behavior: attentive;calm;cooperative  Motor Planning: intact  Coordination: intact    Musculoskeletal Examination  Gross ROM  Right Upper Extremity ROM: within functional limits  Left Upper Extremity ROM: within functional limits  Right Lower Extremity ROM: within functional limits  Left Lower Extremity ROM: within functional limits  Gross Strength  Right Upper Extremity Strength: 3+/5  Left Upper Extremity Strength: 3+/5  Right Lower Extremity Strength: 4-/5  Left Lower Extremity Strength: 4-/5       Functional Mobility:  Functional Mobility  Supine to Sit: Stand by Assist  Scooting to HOB: Modified independent  Scooting to EOB: Modified Independent  Sit to Supine: Stand by Assist  Sit to Stand: Stand by Assist  Stand to Sit: Supervision  Transfers  Bed to Chair: Supervision  Locomotion  Ambulation: Supervision (299ft)  Pattern: Narrow BOS;decreased step length;decreased cadence     Balance  Balance  Sitting - Static: Good  Sitting - Dynamic: Good  Standing - Static: Good  Standing - Dynamic: Fair    Participation and Activity Tolerance  Participation and Endurance  Participation Effort: good  Endurance: Tolerates  < 10 min exercise, no significant change in vital signs  [place] Dyspnea Scale: 0 Dyspnea    Educated the Patient to role of physical therapy, plan of care, goals of therapy and safety with mobility and ADLs, energy conservation techniques, home safety with verbalized understanding  and demonstrated understanding.    Patient left in bed with alarm and all other medical equipment in place and call bell and all personal items/needs within reach.  RN notified of session outcome.      Assessment:  [name] is a 87 y.o. male admitted 05/19/2021.  PT Assessment  Assessment: Gait impairment;Decreased balance;Decreased functional mobility;Decreased LE strength;Decreased UE  strength;Decreased endurance/activity tolerance;Decreased safety/judgement during functional mobility  Prognosis: Good;With continued PT status post acute discharge;Ongoing PT assessment needed  Progress: Progressing toward goals      Treatment: Performed ambulation as noted above with no LOB noted however pt required increased verbal facilitation for posture and gait mechanics to maximize safety.  Pt was also able to ambulate into the bathroom and performed all personal care needs with mod I. All OOB mobility performed with use of gait belt for increased pt and therapist safety. Vitals were assessed throughout session and remained WNLs.  Reviewed PT plan of care and D/C recommendation. Reinforced  use of nursing assistance for safety and use of call bell. Pt verbalized understanding and agreement with plan.  Addressed all pt questions and concerns.    Plan:  Treatment/Interventions: Continued evaluation, Compensatory technique education, Bed mobility, Equipment eval/education, Patient/family training, Endurance training, LE strengthening/ROM, Functional transfer training, Neuromuscular re-education, Gait training, Exercise  PT Frequency: follow-up visit only  Risks/Benefits/POC Discussed with Pt/Family: With patient    PMP Activity: Step  7 - Walks out of Room  Distance Walked (ft) (Step 6,7): 200 Feet      Goals:  Goals  Goal Formulation: With patient  Time for Goal Acheivement: By time of discharge  Goals: Select goal  Pt Will Go Supine To Sit: independent, to maximize functional mobility and independence, Partly met  Pt Will Perform Sit To Supine: independent, to maximize functional mobility and independence, Partly met  Pt Will Perform Sit to Stand: independent, to maximize functional mobility and independence, Partly met  Pt Will Transfer Bed/Chair: independent, to maximize functional mobility and independence, Partly met  Pt Will Ambulate: > 200 feet, independent, to maximize functional mobility and independence, Partly met    Carney Living, PT, DPT  Physical Therapist  Physical Medicine & Rehabilitation  [phone]  Mon-Thur 7-5:30pm  05/20/2021 12:40 PM

## 2021-05-21 LAB — HEMOGLOBIN A1C
Average Estimated Glucose: 131.2 mg/dL
Hemoglobin A1C: 6.2 % — ABNORMAL HIGH (ref 4.6–5.9)

## 2021-05-21 LAB — GLUCOSE WHOLE BLOOD - POCT
Whole Blood Glucose POCT: 81 mg/dL (ref 70–100)
Whole Blood Glucose POCT: 185 mg/dL — ABNORMAL HIGH (ref 70–100)
Whole Blood Glucose POCT: 165 mg/dL — ABNORMAL HIGH (ref 70–100)
Whole Blood Glucose POCT: 222 mg/dL — ABNORMAL HIGH (ref 70–100)

## 2021-05-21 LAB — LIPID PANEL
Cholesterol / HDL Ratio: 2.6 Index
Cholesterol: 119 mg/dL (ref 0–199)
HDL: 46 mg/dL (ref 40–9999)
LDL Calculated: 61 mg/dL (ref 0–99)
Triglycerides: 58 mg/dL (ref 34–149)
VLDL Calculated: 12 mg/dL (ref 10–40)

## 2021-05-21 MED ORDER — MECLIZINE HCL 12.5 MG PO TABS
12.5000 mg | ORAL_TABLET | Freq: Three times a day (TID) | ORAL | Status: DC | PRN
Start: 2021-05-21 — End: 2021-05-22

## 2021-05-21 MED ORDER — SODIUM CHLORIDE 0.9 % IV BOLUS
1000.0000 mL | Freq: Once | INTRAVENOUS | Status: AC
Start: 2021-05-21 — End: 2021-05-21
  Administered 2021-05-21: 1000 mL via INTRAVENOUS

## 2021-05-21 MED ORDER — SODIUM CHLORIDE 0.9 % IV SOLN
INTRAVENOUS | Status: DC
Start: 2021-05-21 — End: 2021-05-22

## 2021-05-21 MED ORDER — SODIUM CHLORIDE 0.9 % IV BOLUS
500.0000 mL | Freq: Once | INTRAVENOUS | Status: AC
Start: 2021-05-21 — End: 2021-05-21
  Administered 2021-05-21: 500 mL via INTRAVENOUS

## 2021-05-21 NOTE — Nursing Progress Note (Signed)
Pt aox4, calm, cooperative. Pt denied pain, sob, dizziness, nausea. Pt NSR on tele and able to tolerate RA. Pt able to ambulate steadily in room. Pt had adequate urine output. Pt educated about plan of care and unit safety measures. Pt able to verbalize understanding. Safety measures maintained. Pt rounded hourly.

## 2021-05-21 NOTE — Progress Notes (Addendum)
PROGRESS NOTE    Date Time: 05/21/21 6:25 PM  Patient Name: [name], [name]  Patient status: Observation  Hospital Day: 0      Assessment:   Syncope due to dehydration, orthostatic hypotension - CT Head Neg   Persistent orthostasis  Dizziness  Acute kidney injury-resolved  Hyperkalemia corrected   Diabetes type 2  BPH  Hyperlipidemia  History of GI bleeding  Full code      Plan:     No further events , neurologically intact -no further neuro work-up  Received bolus , continue IV hydration-symptomatic upon standing and walking  Antivert  Blood sugar controlled, continue to hold metformin  Continue sliding scale coverage   No urine symptoms   Fasting lipids  H&H stable no active bleeding  DVT prophylaxis  OT PT , Activity as tolerated  Discharge planning barrier symptomatic hypotension/orthostasis  Labs and data reviewed.  Discussed with patient and nurse  Discussed with Dr. Moise Boring  Subjective:   Patient seen and examined :  Patient became symptomatic orthostatic, complain of dizziness with standing  Review of Systems:   General ROS: No fever chills.  No chest pain palpitation  Denies shortness of breath, cough, wheezing.  No nausea vomiting abdominal pain.  Complain of dizziness with standing and walking   No Gross focal deficits   Dizziness with standing / Walking    Medications:     Current Facility-Administered Medications   Medication Dose Route Frequency    enoxaparin  40 mg Subcutaneous Daily    insulin lispro  1-4 Units Subcutaneous QHS    insulin lispro  1-8 Units Subcutaneous TID AC         acetaminophen **OR** acetaminophen, calcium carbonate, Nursing communication: Adult Hypoglycemia Treatment Algorithm **AND** glucagon (rDNA) **AND** dextrose **AND** dextrose **AND** dextrose, Nursing communication: Adult Hypoglycemia Treatment Algorithm **AND** glucagon (rDNA) **AND** dextrose **AND** dextrose **AND** dextrose, magnesium sulfate, melatonin, naloxone, ondansetron **OR** ondansetron, potassium & sodium  phosphates, potassium chloride **AND** potassium chloride    Physical Exam:   BP 115/64   Pulse 70   Temp 98.1 F (36.7 C) (Oral)   Resp 18   Ht 1.803 m (5\' 11" )   Wt 61.6 kg (135 lb 14.4 oz)   SpO2 100%   BMI 18.95 kg/m     Intake and Output Summary (Last 24 hours) at Date Time    Intake/Output Summary (Last 24 hours) at 05/21/2021 1825  Last data filed at 05/21/2021 1532  Gross per 24 hour   Intake 480 ml   Output 1000 ml   Net -520 ml         General: Awake alert oriented x3.  Neck: Supple no JVD  Lungs: Clear to auscultation  Heart: Regular rate and rhythm no gallop  Abdomen: Soft nontender bowel sounds positive  Extremities: Warm moist  Neuro: Grossly intact  GU: No CVA tenderness'''  Gait : Unstable     Labs:     CBC w/Diff CMP   Recent Labs   Lab 05/19/21  1941   WBC 8.14   Hgb 12.1*   Hematocrit 36.2*   Platelets 224   MCV 95.5   Neutrophils 76.5         PT/INR         Recent Labs   Lab 05/20/21  0946 05/20/21  0123 05/19/21  1941   Sodium 142 140 139   Potassium 4.4 5.3* 4.7   Chloride 109 106 108   CO2 25 24 22    BUN  19.0 23.0 22.0   Creatinine 1.0 1.3 1.4*   Glucose 110* 107* 97   Calcium 8.8 9.5 9.2   Protein, Total  --   --  6.7   Albumin  --   --  3.5   AST (SGOT)  --   --  18   ALT  --   --  14   Alkaline Phosphatase  --   --  75   Bilirubin, Total  --   --  0.5        Glucose POCT   Recent Labs   Lab 05/20/21  0946 05/20/21  0123 05/19/21  1941   Glucose 110* 107* 97          Recent Labs   Lab 05/20/21  0427 05/20/21  0123 05/19/21  1941   Troponin I <0.01 <0.01 <0.01         Rads:   No results found.      Sharlette Dense, MD  05/21/2021    *This note was generated by the Epic EMR system/ Dragon speech recognition and may contain inherent errors or omissions not intended by the user. Grammatical errors, random word insertions, deletions, pronoun errors and incomplete sentences are occasional consequences of this technology due to software limitations. Not all errors are caught or corrected. If  there are questions or concerns about the content of this note or information contained within the body of this dictation they should be addressed directly with the author for clarification

## 2021-05-21 NOTE — Plan of Care (Signed)
Problem: Hemodynamic Status: Cardiac  Goal: Stable vital signs and fluid balance  Outcome: Progressing  Flowsheets (Taken 05/20/2021 1201 by Marko Stai, RN)  Stable vital signs and fluid balance:   Monitor/assess vital signs and telemetry per unit protocol   Monitor intake/output per unit protocol and/or LIP order   Weigh on admission and record weight daily   Assess signs and symptoms associated with cardiac rhythm changes

## 2021-05-21 NOTE — UM Notes (Signed)
Concurrent review: May 21, 2021     Current VS:   Temp:  [97.7 F (36.5 C)-98.1 F (36.7 C)]   Heart Rate:  [49-83]   Resp Rate:  [14-18]   BP: (101-162)/(60-76)   SpO2:  [99 %-100 %]   Weight:  [61.6 kg (135 lb 14.4 oz)]     Labs:  Results       Procedure Component Value Units Date/Time    Glucose Whole Blood - POCT [[id number]]  (Abnormal) Collected: 05/21/21 1143     Updated: 05/21/21 1249     Whole Blood Glucose POCT 185 mg/dL     Lipid panel [[id number]] Collected: 05/21/21 0412    Specimen: Blood Updated: 05/21/21 0950     Cholesterol 119 mg/dL      Triglycerides 58 mg/dL      HDL 46 mg/dL      LDL Calculated 61 mg/dL      VLDL Calculated 12 mg/dL      Cholesterol / HDL Ratio 2.6 Index     Glucose Whole Blood - POCT [[id number]] Collected: 05/21/21 0757     Updated: 05/21/21 0902     Whole Blood Glucose POCT 81 mg/dL     Hemoglobin Y8M [[id number]]  (Abnormal) Collected: 05/21/21 0412    Specimen: Blood Updated: 05/21/21 0818     Hemoglobin A1C 6.2 %      Average Estimated Glucose 131.2 mg/dL     Glucose Whole Blood - POCT [[id number]]  (Abnormal) Collected: 05/20/21 2129     Updated: 05/20/21 2131     Whole Blood Glucose POCT 170 mg/dL     Glucose Whole Blood - POCT [[id number]]  (Abnormal) Collected: 05/20/21 1619     Updated: 05/20/21 1622     Whole Blood Glucose POCT 164 mg/dL             Neuro progress note:  Orthostatics are normal.  No further neuro work up.    Medicine progress note:  Assessment:   Syncope due to dehydration, orthostatic hypotension - CT Head Neg  Orthostatic hypotension  Acute kidney injury-resolved  Hyperkalemia corrected   Diabetes type 2  BPH  Hyperlipidemia  History of GI bleeding/Hello  Full code        Plan:      No further events , neurologically intact  Hydration-orthostatic checks  Blood sugar controlled , holding metformin  No urine symptoms  Fasting lipids  H&H stable no active bleeding  DVT prophylaxis  OT PT , Activity as tolerated  DCP When stable      Continue rest of  treatment.  Discussed plan with patient & Nurse  Scheduled Meds:  Current Facility-Administered Medications   Medication Dose Route Frequency    enoxaparin  40 mg Subcutaneous Daily    insulin lispro  1-4 Units Subcutaneous QHS    insulin lispro  1-8 Units Subcutaneous TID AC     Continuous Infusions:  PRN Meds:.acetaminophen **OR** acetaminophen, calcium carbonate, Nursing communication: Adult Hypoglycemia Treatment Algorithm **AND** glucagon (rDNA) **AND** dextrose **AND** dextrose **AND** dextrose, Nursing communication: Adult Hypoglycemia Treatment Algorithm **AND** glucagon (rDNA) **AND** dextrose **AND** dextrose **AND** dextrose, magnesium sulfate, melatonin, naloxone, ondansetron **OR** ondansetron, potassium & sodium phosphates, potassium chloride **AND** potassium chloride      Intake/Output Summary (Last 24 hours) at 05/21/2021 1445  Last data filed at 05/21/2021 1000  Gross per 24 hour   Intake 1590 ml   Output 1700 ml   Net -110 ml  UTILIZATION REVIEW CONTACT: Mindi Junker, UR RN  Clinical case manager- Utilization review  Main UR#: [phone]  Direct #: [phone]  Email: [email or web address] Tax ID & NPI     Tax ID NPI   Darnelle Spangle [id number]   Montez Hageman [id number]   [place] [id number] [id number]   [place] [phone] - NEW;   [phone] - OLD;   [place]  [phone]       NOTES TO REVIEWER:     This clinical review is based on/compiled from documentation provided by the treatment team within the patient's medical record.

## 2021-05-21 NOTE — Plan of Care (Addendum)
Assumed care of pt from night shift. A/O x4. No sign of distress noted. IV site clean dry and intact. Pt ambulated on the unit without sign of syncope noted, but was shaky and held the walls while ambulated. No falls noted. Fall precaution in place, bed low, locked and bed alarm turned on. No complaint of pain during shift. All meds administered per MD orders during shift.       Problem: Inadequate Tissue Perfusion  Goal: Adequate tissue perfusion will be maintained  Outcome: Progressing     Problem: Ineffective Gas Exchange  Goal: Effective breathing pattern  Outcome: Progressing     Problem: Nutrition  Goal: Nutritional intake is adequate  Outcome: Progressing     Problem: Impaired Mobility  Goal: Mobility/Activity is maintained at optimal level for patient  Outcome: Progressing     Problem: Safety  Goal: Patient will be free from injury during hospitalization  Outcome: Progressing  Goal: Patient will be free from infection during hospitalization  Outcome: Progressing     Problem: Pain  Goal: Pain at adequate level as identified by patient  Outcome: Progressing     Problem: Side Effects from Pain Analgesia  Goal: Patient will experience minimal side effects of analgesic therapy  Outcome: Progressing     Problem: Discharge Barriers  Goal: Patient will be discharged home or other facility with appropriate resources  Outcome: Progressing     Problem: Psychosocial and Spiritual Needs  Goal: Demonstrates ability to cope with hospitalization/illness  Outcome: Progressing

## 2021-05-21 NOTE — Progress Notes (Signed)
Orthostatics are normal.  No further neuro work up.

## 2021-05-22 LAB — GLUCOSE WHOLE BLOOD - POCT: Whole Blood Glucose POCT: 87 mg/dL (ref 70–100)

## 2021-05-22 MED ORDER — MECLIZINE HCL 12.5 MG PO TABS
12.5000 mg | ORAL_TABLET | Freq: Three times a day (TID) | ORAL | 0 refills | Status: DC | PRN
Start: 2021-05-22 — End: 2023-10-12

## 2021-05-22 NOTE — Plan of Care (Signed)
Discharge order verified, pt ID verified    Pt cleared for discharge. RN provided discharge instructions along with information regarding dehydration, orthostasis, medication regimen, and f/u appointments to the pt. Pt verbalized understanding of the preceding information. All questions and concerns were addressed. Prescriptions sent to pt's pharmacy on file. PIV removed. Telemetry removed and returned. Pt will be transported off unit with all belongings via wheelchair.     NAD noted, no complaints voiced.    Discharge home with no needs per therapy.    VSSBP 154/71    Pulse 75    Temp 97.5 F (36.4 C) (Oral)    Resp 16    Ht 1.803 m (5\' 11" )    Wt 64.7 kg (142 lb 11.2 oz)    SpO2 100%    BMI 19.90 kg/m

## 2021-05-22 NOTE — Discharge Instr - AVS First Page (Addendum)
Date of Admission: 05/19/2021    Date of Discharge: 05/22/2021    Discharge Physician: Sharlette Dense, MD, MD    Dear [name],     Thank you for choosing [facility] for your emergency care needs. We strive to provide EXCELLENT care to you and your family.     In an effort to explain clearly why you were here in the hospital, I've written a very brief summary. I hope that you find it useful. Other details including formal diagnosis, medication changes, follow up appointment recommendations, and access to MyChart for formal medical records can be found in this packet.       You were admitted for:   Syncope, no neurological source.  Orthostatic hypotension due to dehydration.  Dehydration.  Drink a lot of fluids preferably Gatorade, Pedialyte.      If you were prescribed medications, they were either sent to your pharmacy or provided to you as paper prescriptions.      Please Make sure to follow up with your primary care doctor within 1 week..     Please don't hesitate to call me with any questions. It was a pleasure taking care of you, please do not hesitate to call me on number listed below for any questions.        Finally, as your discharging physician, you may be receiving a survey which is regarding my care. I would greatly value and appreciate your feedback as I strive for excellence.     Respectfully yours,    Sharlette Dense, MD, MD        [facility]   [address]   [address]   [place] [address]   [phone]

## 2021-05-22 NOTE — Discharge Summary (Signed)
DISCHARGE SUMMARY    Date Time: 05/22/21 6:16 PM  Patient Name: [name]  Attending Physician: No att. providers found    Date of Admission:   05/19/2021    Date of Discharge:   05/22/2021    Reason for Admission:   Syncope and collapse [R55]    Discharge Dx:   Present on Admission:   Syncope and collapse  Syncope due to dehydration, orthostatic hypotension - CT Head Neg   Persistent orthostasis , resolved after hydration patient had fluid boluses  Dizziness, due to  dehydration  Acute kidney injury-resolved  Hyperkalemia corrected  Diabetes type 2  BPH  Hyperlipidemia  History of GI bleeding    Consultations:   Treatment Team:   Consulting Physician: Ardelle Anton, MD      Procedures performed:       Discharge Medications:        Discharge Medication List        Taking      acetaminophen 325 MG tablet  Dose: 650 mg  Commonly known as: TYLENOL  Take 2 tablets (650 mg total) by mouth every 4 (four) hours as needed for Pain.     meclizine 12.5 MG tablet  Dose: 12.5 mg  Commonly known as: ANTIVERT  Take 1 tablet (12.5 mg total) by mouth 3 (three) times daily as needed for Dizziness For head Spinning     metFORMIN 1000 MG tablet  Dose: 1,000 mg  Commonly known as: GLUCOPHAGE  Take 1 tablet (1,000 mg total) by mouth every morning with breakfast.                Hospital Course:     Details of admission and consultations on record.  [name] is a 87 y.o. male with past medical history remarkable for diabetes mellitus, history of GI bleeding, anemia, benign prostatic hypertrophy who presented to [facility] emergency room after a syncopal episode.  Patient was going out to eat with his family and was getting out of the car when he felt lightheaded and passed out patient denied loss of consciousness head injury patient had an alcoholic beverage before leaving homes he thinks that he took too much that made him dizzy.  In emergency room patient was found to be hypotensive blood pressure 84/46 CT head was  negative for acute process chest x-ray negative for acute process CT abdomen showed diverticulosis and large prostate and mild stool retention.  Patient was admitted to hospital for further management.  Hospital course:  Patient was hydrated he had positive orthostasis, patient required fluid boluses initially was getting lightheaded and dizzy upon standing subsequently he improved.  This morning he was able to walk in the room without any symptoms and made him walk in the hallway no symptoms.  He was seen by neurology recommendations followed no further testing was suggested.  Patient is will be discharged home in stable condition with recommendations to follow-up with primary care doctor within 1 week.  He will continue home medication.  Further hospital course per Daily Notes.  Laboratory Data     CBC  Recent Labs   Lab 05/19/21  1941   WBC 8.14   Hgb 12.1*   Hematocrit 36.2*   Platelets 224   MCV 95.5   Neutrophils 76.5       CMP  Recent Labs   Lab 05/20/21  0946 05/20/21  0123 05/19/21  1941   Sodium 142 140 139   Potassium 4.4 5.3* 4.7   Chloride  109 106 108   CO2 25 24 22    BUN 19.0 23.0 22.0   Creatinine 1.0 1.3 1.4*   Glucose 110* 107* 97   Calcium 8.8 9.5 9.2   Protein, Total  --   --  6.7   Albumin  --   --  3.5   AST (SGOT)  --   --  18   ALT  --   --  14   Alkaline Phosphatase  --   --  75   Bilirubin, Total  --   --  0.5       Lipid panel  Recent Labs   Lab 05/21/21  0412   Cholesterol 119   Triglycerides 58   HDL 46   LDL Calculated 61       Recent Labs   Lab 05/21/21  0412   Hemoglobin A1C 6.2*       Lab Results   Component Value Date    TSH 1.34 06/19/2018       Cardiac enzymes  Recent Labs   Lab 05/20/21  0427 05/20/21  0123 05/19/21  1941   Troponin I <0.01 <0.01 <0.01             Culture:   Microbiology Results (last 15 days)       Procedure Component Value Units Date/Time    COVID-19 (SARS-COV-2) [facility] Rapid) [[id number]] Collected: 05/19/21 2300    Order Status: Completed Specimen:  Nasopharyngeal Swab from Nasopharynx Updated: 05/19/21 2341     Purpose of COVID testing Screening     SARS-CoV-2 Specimen Source Nasopharyngeal     SARS CoV-2 Negative     Comment: Test performed using the Abbott ID NOW EUA assay.  Please see Fact Sheets for patients and providers located at:  http://www.rice.biz/    This test is for the qualitative detection of SARS-CoV-2  (COVID19) nucleic acid. Viral nucleic acids may persist in vivo,  independent of viability. Detection of viral nucleic acid does  not imply the presence of infectious virus, or that virus  nucleic acid is the cause of clinical symptoms. Negative  results should be treated as presumptive and, if inconsistent  with clinical signs and symptoms or necessary for patient  management, should be tested with an alternative molecular  assay. Negative results do not preclude SARS-CoV-2 infection  and should not be used as the sole basis for patient  management decisions. Invalid results may be due to inhibiting  substances in the specimen and recollection should occur.         Narrative:      o Collect and clearly label specimen type:  o Upper respiratory specimen: One Nasopharyngeal Dry Swab NO  Transport Media.  o Hand deliver to laboratory ASAP  Indication for testing->Extended care facility admission to  semi private room  Screening            All radiology result for current encounter:  CT Abd/Pelvis without Contrast    Result Date: 05/19/2021   DIVERTICULOSIS. PROSTRATE GLAND ENLARGEMENT. NO HYDRONEPHROSIS. NO BOWEL OBSTRUCTION. MILD STOOL RETENTION. Darnelle Maffucci, MD  05/19/2021 8:51 PM    CT Head WO Contrast    Result Date: 05/19/2021      NO ACUTE INTRACRANIAL ABNORMALITY. Darnelle Maffucci, MD  05/19/2021 8:41 PM    Chest AP Portable    Result Date: 05/19/2021      No acute abnormality. Darnelle Maffucci, MD  05/19/2021 8:19 PM     Echo Results  None              Physical Exam:   BP 154/71    Pulse 75    Temp 97.5 F (36.4 C) (Oral)    Resp 16     Ht 1.803 m (5\' 11" )    Wt 64.7 kg (142 lb 11.2 oz)    SpO2 100%    BMI 19.90 kg/m     General appearance - alert, and in no distress  HEENT: Normocephalic,atraumatic, pupil equal  Neck - supple  Chest - clear to auscultation  Heart - normal rate and regular rhythm  Abdomen - bowel sounds normal, soft, non distended  Extremities - no pedal edema  Neurological - Alert no gross focal deficit    Discharge condition:   Stable     Discharge  Diet :   Cardiac     Discharge Instructions:   Follow up:     [name], MD  [address]  [place] [address]  [phone]    Schedule an appointment as soon as possible for a visit  Get Seen with in 1 Week        Sharlette Dense, MD  05/22/2021  6:16 PM  Time spent 45 minutes

## 2021-05-22 NOTE — Plan of Care (Signed)
Offered Meclizine last night but patient refused & states he does not have dizziness anymore. NS 500 ml given as ordered. Normal sinus rhythm on telemetry, BP was stable last night but starts to elevate this morning, will recheck.  Blood glucose was elevated at bedtime, possibly from late dinner, Insulin coverage given.    Problem: Hemodynamic Status: Cardiac  Goal: Stable vital signs and fluid balance  Outcome: Progressing  Flowsheets (Taken 05/22/2021 0532)  Stable vital signs and fluid balance:   Monitor/assess vital signs and telemetry per unit protocol   Assess signs and symptoms associated with cardiac rhythm changes   Monitor lab values   Monitor intake/output per unit protocol and/or LIP order     Problem: Neurological Deficit  Goal: Neurological status is stable or improving  Outcome: Progressing  Flowsheets (Taken 05/22/2021 0532)  Neurological status is stable or improving:   Perform CAM Assessment   Monitor/assess/document neurological assessment (Stroke: every 4 hours)     Problem: Diabetes: Glucose Imbalance  Goal: Blood glucose stable at established goal  Outcome: Progressing  Flowsheets (Taken 05/22/2021 0532)  Blood glucose stable at established goal:   Monitor lab values   Ensure adequate hydration   Assess for hypoglycemia /hyperglycemia   Monitor/assess vital signs     Problem: Impaired Mobility  Goal: Mobility/Activity is maintained at optimal level for patient  Outcome: Progressing  Flowsheets (Taken 05/22/2021 0532)  Mobility/activity is maintained at optimal level for patient:   Increase mobility as tolerated/progressive mobility   Maintain proper body alignment   Perform active/passive ROM     Problem: Safety  Goal: Patient will be free from injury during hospitalization  Outcome: Progressing  Flowsheets (Taken 05/22/2021 0532)  Patient will be free from injury during hospitalization:   Assess patient's risk for falls and implement fall prevention plan of care per policy   Provide and maintain  safe environment   Use appropriate transfer methods   Hourly rounding   Include patient/ family/ care giver in decisions related to safety     Problem: Moderate/High Fall Risk Score >5  Goal: Patient will remain free of falls  Outcome: Progressing  Flowsheets (Taken 05/21/2021 2200)  High (Greater than 13):   HIGH-Consider use of low bed   HIGH-Apply yellow "Fall Risk" arm band   HIGH-Bed alarm on at all times while patient in bed   MOD-Perform dangle, stand, walk (DSW) prior to mobilization   MOD-Request PT/OT consult order for patients with gait/mobility impairment   MOD-Include family in multidisciplinary POC discussions   LOW-Anticoagulation education for injury risk   MOD-Remain with patient during toileting

## 2021-06-06 ENCOUNTER — Emergency Department: Payer: Medicare Other

## 2021-06-06 ENCOUNTER — Emergency Department
Admission: EM | Admit: 2021-06-06 | Discharge: 2021-06-06 | Disposition: A | Discharge status: Home / Self Care | Payer: Medicare Other | Attending: Emergency Medicine | Admitting: Emergency Medicine

## 2021-06-06 DIAGNOSIS — S3992XA Unspecified injury of lower back, initial encounter: Secondary | ICD-10-CM | POA: Insufficient documentation

## 2021-06-06 DIAGNOSIS — W19XXXA Unspecified fall, initial encounter: Secondary | ICD-10-CM | POA: Insufficient documentation

## 2021-06-06 DIAGNOSIS — F10120 Alcohol abuse with intoxication, uncomplicated: Secondary | ICD-10-CM | POA: Insufficient documentation

## 2021-06-06 DIAGNOSIS — F17210 Nicotine dependence, cigarettes, uncomplicated: Secondary | ICD-10-CM | POA: Insufficient documentation

## 2021-06-06 DIAGNOSIS — F10920 Alcohol use, unspecified with intoxication, uncomplicated: Secondary | ICD-10-CM

## 2021-06-06 DIAGNOSIS — S0990XA Unspecified injury of head, initial encounter: Secondary | ICD-10-CM | POA: Insufficient documentation

## 2021-06-06 DIAGNOSIS — Y92511 Restaurant or cafe as the place of occurrence of the external cause: Secondary | ICD-10-CM | POA: Insufficient documentation

## 2021-06-06 MED ORDER — SODIUM CHLORIDE 0.9 % IV BOLUS
1000.0000 mL | Freq: Once | INTRAVENOUS | Status: DC
Start: 2021-06-06 — End: 2021-06-06

## 2021-06-06 NOTE — ED Provider Notes (Signed)
EMERGENCY DEPARTMENT HISTORY AND PHYSICAL EXAM     None      Date: 06/06/2021   Patient Name: [name]  Attending Physician: Darcus Pester, MD   Advanced Practice Provider: Baltazar Apo., PA    History of Presenting Illness     Chief Complaint:  Chief Complaint   Patient presents with    Alcohol Intoxication     HPI: [name] is a pleasant 87 y.o. male who  has a past medical history of Diabetes mellitus and No diagnosis. (per Epic records), EMS reports called to restaurant where patient was found sitting with his family, reported EtOH, patient requesting to go to the hospital but without specific complaint.  Patient states he fell onto his back, cannot give additional specifics, reports right SI area pain from fall, denies other injury.  Denies headache, LOC, neck pain, back pain, chest pain, shortness of breath, abdominal pain, syncope.    See ROS for additional pertinent positives and negatives.    PCP: [name], MD  Review of Systems   Review of Systems  MSK positive for SI area pain  Neuro negative for headache  Except as noted above/or elsewhere in this note, ROS is negative/noncontributory.  Physical Exam   Pulse 66  BP 101/67  Resp 14  SpO2 99 %  Temp 98.4 F (36.9 C)  Vitals:    06/06/21 0251 06/06/21 0301 06/06/21 0405 06/06/21 0533   BP: 101/67 119/58  136/64   Pulse:  68  82   Resp:       Temp:       TempSrc:       SpO2:  99%     Weight:   65.8 kg    Height:   5\' 11"  (1.803 m)        Physical Exam    Constitutional: Pt appears well-developed and well-nourished.  Odor consistent c  ETOH.    Head:  Normocephalic.     Eyes:  Pupils are equal, round, and reactive to light.     ENT:   .     Neck/Back:  Inspection WNL.  Lumbar paraspinal and right SI area tenderness.  Pelvic girdle stable    Pulmonary/Chest: Effort normal.  CTA, chest wall nontender    Cardiovascular:  No pallor.     Abdomen/Pelvic: Soft, nontender, no guarding, no McBurney's, no Murphy's.    Neurological: Pt  is alert.  Marland Kitchen    Psychiatric: Pt is lucid.  No apparent distress     Skin: Skin is intact unless otherwise noted.     MSK: Gross sensory and motor is intact unless otherwise noted.     Extremity: Right lower extremity  Extremity:     Present Absent   Present Absent   Tenderness  x  Tenderness     STS  x  STS     Deformity  x  Deformity     Effusion    Effusion      Normal Abnorm   Normal Abnorm   ROM    ROM     Stability x   Stability     Distal NV x   Distal NV     Pain with hip range of motion          Past History   Past Medical History:  Past Medical History:   Diagnosis Date    Diabetes mellitus     No diagnosis      Past Surgical  History:  Past Surgical History:   Procedure Laterality Date    APPENDECTOMY (OPEN)      EGD, BIOPSY N/A 06/30/2015    Procedure: EGD, BIOPSY;  Surgeon: [name], MD;  Location: [facility];  Service: Gastroenterology;  Laterality: N/A;    EGD, COLONOSCOPY N/A 08/04/2015    Procedure: EGD, COLONOSCOPY;  Surgeon: [name], MD;  Location: [facility];  Service: Gastroenterology;  Laterality: N/A;     Family/Social History:  He reports that he has been smoking cigarettes. He has been smoking an average of .5 packs per day. He uses smokeless tobacco. He reports current alcohol use. He reports that he does not use drugs.    No family history on file.    Allergies:  No Known Allergies    Listed Medications on Arrival:  Home Medications               acetaminophen (TYLENOL) 325 MG tablet     Take 2 tablets (650 mg total) by mouth every 4 (four) hours as needed for Pain.     meclizine (ANTIVERT) 12.5 MG tablet     Take 1 tablet (12.5 mg total) by mouth 3 (three) times daily as needed for Dizziness For head Spinning     metFORMIN (GLUCOPHAGE) 1000 MG tablet     Take 1 tablet (1,000 mg total) by mouth every morning with breakfast.              Diagnostic Study Results   Labs -     Results       ** No results found for the last 24 hours. **            Radiologic Studies -   Radiology  Results (24 Hour)       Procedure Component Value Units Date/Time    Lumbar Spine AP and Lateral [[id number]] Collected: 06/06/21 0418    Order Status: Completed Updated: 06/06/21 0421    Narrative:      HISTORY: fall.    COMPARISON: 12/30/2007    TECHNIQUE: XR LUMBAR SPINE AP AND LATERAL    FINDINGS:  Anterior osteophytes are present throughout the lumbar spine and have  increased. The vertebral body heights and alignment are normal.  There  is no evidence of fracture or subluxation. Vascular calcifications are  present.      Impression:        Degenerative changes in the lumbar spine. No evidence of fracture.    Jorene Guest, MD   06/06/2021 4:19 AM    Hip Right AP and Lateral with Pelvis [[id number]] Collected: 06/06/21 0404    Order Status: Completed Updated: 06/06/21 0407    Narrative:      HISTORY: fall.    COMPARISON:  None     TECHNIQUE: XR HIP RIGHT 1 VW WITH PELVIS    FINDINGS:   The bones and joints are normal.  There is no evidence of fracture or  dislocation.  The soft tissues are normal.      Impression:        Negative x-rays of the right hip.    Jorene Guest, MD   06/06/2021 4:05 AM    CT Cervical Spine without Contrast [[id number]] Collected: 06/06/21 0346    Order Status: Completed Updated: 06/06/21 0350    Narrative:      CT CERVICAL SPINE WO CONTRAST      HISTORY: fall.    COMPARISON: 04/13/2021    TECHNIQUE: Multiple axial images were obtained  through the cervical  spine without contrast.  Multiplanar reconstructions were performed.   One of the following dose optimization techniques was utilized in the  performance of this exam: Automated exposure control; adjustment of the  mA and/or kV according to the patient's size; or use of an iterative   reconstruction technique.  Specific details can be referenced in the  facility's radiology CT exam operational policy.    FINDINGS:    There is moderate disc narrowing at C5-6 and C6-7. Mild central canal  stenosis is present at these levels. The vertebral body  heights and  alignment are normal. Multilevel facet joint hypertrophy is present.   There is no evidence of fracture or subluxation.     The paravertebral soft tissues are normal.      Impression:          Degenerative changes in the cervical spine with no evidence of fracture.    Jorene Guest, MD   06/06/2021 3:48 AM    CT Head without Contrast [[id number]] Collected: 06/06/21 0343    Order Status: Completed Updated: 06/06/21 0348    Narrative:      CT HEAD WO CONTRAST      HISTORY: fall.    COMPARISON: 05/19/2021    TECHNIQUE: Multiple axial images were obtained from the base of the  skull to the vertex  without contrast. Sagittal and coronal  reconstructions were performed. One of the following dose optimization  techniques was utilized in the performance of this exam: Automated  exposure control; adjustment of the mA and/or kV according to the  patient's size; or use of an iterative  reconstruction technique.   Specific details can be referenced in the facility's radiology CT exam  operational policy.    FINDINGS:  The ventricles and sulci are enlarged compatible with mild atrophy.  There are mild white matter changes. There is no evidence of acute  infarct. There is no hemorrhage, mass, or midline shift.    The bones are normal.  The visualized paranasal sinuses are normal. The  soft tissues are normal.      Impression:        1. No acute intracranial process.    Jorene Guest, MD   06/06/2021 3:45 AM        .      Medical Decision Making   I am the first provider for this patient.  I reviewed the vital signs, available nursing notes, past medical history, past surgical history, family history and social history.    Vital Signs-Reviewed the patient's vital signs.   Patient Vitals for the past 12 hrs:   BP Temp Pulse Resp   06/06/21 0533 136/64 -- 82 --   06/06/21 0301 119/58 -- 68 --   06/06/21 0251 101/67 -- -- --   06/06/21 0248 -- 98.4 F (36.9 C) 66 14       Pt SaO2 as above    Procedures:   Procedures    Clinical  Decision Support:       Old Medical Records: Nursing notes, recent past visit information.    ED Course:   ED Course as of 06/06/21 0714   Sat Jun 06, 2021   1308 Patient ambulatory, no new complaints. [KR]      ED Course User Index  [KR] Domenique Southers, Leretha Dykes., PA         Medications Given in the ED:  ED Medication Orders (From admission, onward)      Start Ordered  Status Ordering Provider    06/06/21 0435 06/06/21 0434    Once        Route: Intravenous  Ordered Dose: 1,000 mL     Discontinued Yvan Dority J JR.            Medications Prescribed:  Discharge Prescriptions    None         Provider Notes:    87 y.o. male presents with EtOH intoxication, reported ground-level fall.  Reassuring radiology for absence of significant bony injury, patient also ambulatory.  Patient lucid throughout ED stay, no other complaints.    Though other pathology possible, pt is without current signs of instability, medical urgency or emergency.  Pt well appearing at discharge.  Pt/caregiver understands possibility of progression of disease, need for continued care and assessment outpatient.  Aftercare and return precautions discussed.          Diagnosis     Clinical Impression:   1. Alcoholic intoxication without complication    2. Lower back injury, initial encounter        Treatment Plan:   ED Disposition       ED Disposition   Discharge    Condition   --    Date/Time   Sat Jun 06, 2021  6:47 AM    Comment   [name] discharge to home/self care.    Condition at disposition: Stable                 Followup: (See discharge instructions if not listed here)     This note was completed using dragon medical speech recognition software. Grammatical errors, random word insertions, pronoun errors, incorrect word insertion, misspellings and incomplete sentences are occasional consequences of this technology due to software limitations. If there are questions or concerns about the content of this note or information contained within  the body of this dictation they should be addressed with the provider for clarification. This chart may have been refreshed after admission or discharge, and therefore some results may not have been available for my review at the time of my involvement with the patient.  _____________________________    CHART OWNERSHIP: I, Luellen Pucker, PA-C, am the primary clinician of record.     Baltazar Apo., PA  06/06/21 8657       Darcus Pester, MD  06/06/21 [phone]

## 2021-06-06 NOTE — ED Triage Notes (Signed)
Pt here via EMS for ETOH intoxication. Pt found outside bar per EMS. Pt cooperative and follows commands. Pt ambulatory to ER bed from EMS cot.  No evidence of trauma. Pt has no c/o

## 2021-06-06 NOTE — ED Notes (Signed)
Bed: GR12  Expected date:   Expected time:   Means of arrival:   Comments:  M,208

## 2021-06-06 NOTE — Discharge Instructions (Addendum)
Return (or call 911 as needed) if the patient's symptoms change, new/additional symptoms begin, or if you feel the patient is getting worse in general, or for any other concerns.  Always follow up with an outpatient provider as directed.

## 2021-08-07 ENCOUNTER — Emergency Department: Payer: Medicare Other

## 2021-08-07 ENCOUNTER — Emergency Department
Admission: EM | Admit: 2021-08-07 | Discharge: 2021-08-08 | Disposition: A | Discharge status: Home / Self Care | Payer: Medicare Other | Attending: Emergency Medicine | Admitting: Emergency Medicine

## 2021-08-07 DIAGNOSIS — R112 Nausea with vomiting, unspecified: Secondary | ICD-10-CM | POA: Insufficient documentation

## 2021-08-07 DIAGNOSIS — R1084 Generalized abdominal pain: Secondary | ICD-10-CM | POA: Insufficient documentation

## 2021-08-07 LAB — ECG 12-LEAD
Atrial Rate: 73 {beats}/min
Atrial Rate: 77 {beats}/min
P Axis: 53 degrees
P-R Interval: 178 ms
P-R Interval: 182 ms
Q-T Interval: 374 ms
Q-T Interval: 380 ms
QRS Duration: 78 ms
QRS Duration: 90 ms
QTC Calculation (Bezet): 412 ms
QTC Calculation (Bezet): 430 ms
R Axis: 141 degrees
R Axis: 39 degrees
T Axis: 132 degrees
T Axis: 46 degrees
Ventricular Rate: 73 {beats}/min
Ventricular Rate: 77 {beats}/min

## 2021-08-07 LAB — CBC AND DIFFERENTIAL
Absolute NRBC: 0 10*3/uL (ref 0.00–0.00)
Basophils Absolute Automated: 0.03 10*3/uL (ref 0.00–0.08)
Basophils Automated: 0.4 %
Eosinophils Absolute Automated: 0.16 10*3/uL (ref 0.00–0.44)
Eosinophils Automated: 2 %
Hematocrit: 34.4 % — ABNORMAL LOW (ref 37.6–49.6)
Hgb: 11.3 g/dL — ABNORMAL LOW (ref 12.5–17.1)
Immature Granulocytes Absolute: 0.03 10*3/uL (ref 0.00–0.07)
Immature Granulocytes: 0.4 %
Lymphocytes Absolute Automated: 1.08 10*3/uL (ref 0.42–3.22)
Lymphocytes Automated: 13.8 %
MCH: 31.7 pg (ref 25.1–33.5)
MCHC: 32.8 g/dL (ref 31.5–35.8)
MCV: 96.6 fL — ABNORMAL HIGH (ref 78.0–96.0)
MPV: 10.1 fL (ref 8.9–12.5)
Monocytes Absolute Automated: 0.44 10*3/uL (ref 0.21–0.85)
Monocytes: 5.6 %
Neutrophils Absolute: 6.07 10*3/uL (ref 1.10–6.33)
Neutrophils: 77.8 %
Nucleated RBC: 0 /100 WBC (ref 0.0–0.0)
Platelets: 220 10*3/uL (ref 142–346)
RBC: 3.56 10*6/uL — ABNORMAL LOW (ref 4.20–5.90)
RDW: 13 % (ref 11–15)
WBC: 7.81 10*3/uL (ref 3.10–9.50)

## 2021-08-07 LAB — GFR: EGFR: 53.5

## 2021-08-07 LAB — COMPREHENSIVE METABOLIC PANEL
ALT: 15 U/L (ref 0–55)
AST (SGOT): 20 U/L (ref 5–41)
Albumin/Globulin Ratio: 0.9 (ref 0.9–2.2)
Albumin: 3.5 g/dL (ref 3.5–5.0)
Alkaline Phosphatase: 81 U/L (ref 37–117)
Anion Gap: 10 (ref 5.0–15.0)
BUN: 27 mg/dL (ref 9.0–28.0)
Bilirubin, Total: 0.3 mg/dL (ref 0.2–1.2)
CO2: 23 mEq/L (ref 17–29)
Calcium: 9.3 mg/dL (ref 7.9–10.2)
Chloride: 107 mEq/L (ref 99–111)
Creatinine: 1.5 mg/dL (ref 0.5–1.5)
Globulin: 3.7 g/dL — ABNORMAL HIGH (ref 2.0–3.6)
Glucose: 86 mg/dL (ref 70–100)
Potassium: 4.2 mEq/L (ref 3.5–5.3)
Protein, Total: 7.2 g/dL (ref 6.0–8.3)
Sodium: 140 mEq/L (ref 135–145)

## 2021-08-07 LAB — GLUCOSE WHOLE BLOOD - POCT: Whole Blood Glucose POCT: 92 mg/dL (ref 70–100)

## 2021-08-07 LAB — TROPONIN I: Troponin I: <0.01 ng/mL (ref 0.00–0.05)

## 2021-08-07 MED ORDER — SODIUM CHLORIDE 0.9 % IV BOLUS
1000.0000 mL | Freq: Once | INTRAVENOUS | Status: AC
Start: 2021-08-07 — End: 2021-08-07
  Administered 2021-08-07: 1000 mL via INTRAVENOUS

## 2021-08-07 MED ORDER — ONDANSETRON HCL 4 MG/2ML IJ SOLN
4.0000 mg | Freq: Once | INTRAMUSCULAR | Status: DC
Start: 2021-08-07 — End: 2021-08-08

## 2021-08-07 MED ORDER — IOHEXOL 350 MG/ML IV SOLN
100.0000 mL | Freq: Once | INTRAVENOUS | Status: AC | PRN
Start: 2021-08-07 — End: 2021-08-07
  Administered 2021-08-07: 100 mL via INTRAVENOUS

## 2021-08-07 NOTE — EDIE (Signed)
COLLECTIVE?NOTIFICATION?08/07/2021 19:22?Erler, Neko?MRN: [id number]    Criteria Met      History of Alcohol Use Disorder (12 mo.)    Security and Safety  No Security Events were found.  ED Care Guidelines  There are currently no ED Care Guidelines for this patient. Please check your facility's medical records system.    Flags      Negative COVID-19 Lab Result - [facility] - A specimen collected from this patient was negative for COVID-19 / Attributed By: [place] Department [facility] / Attributed On: 07/03/2021       Prescription Monitoring Program  360??- Narcotic Use Score  140??- Sedative Use Score  000??- Stimulant Use Score  180??- Overdose Risk Score  - All Scores range from 000-999 with 75% of the population scoring < 200 and on 1% scoring above 650  - The last digit of the narcotic, sedative, and stimulant score indicates the number of active prescriptions of that type  - Higher Use scores correlate with increased prescribers, pharmacies, mg equiv, and overlapping prescriptions  - Higher Overdose Risk Scores correlate with increased risk of unintentional overdose death   Concerning or unexpectedly high scores should prompt a review of the PMP record; this does not constitute checking PMP for prescribing purposes.    E.D. Visit Count (12 mo.)  Facility Visits   [place] [facility] 3   [facility] 1   Total 4   Note: Visits indicate total known visits.     Recent Emergency Department Visit Summary  Date Facility [facility] Type Diagnoses or Chief Complaint    Aug 07, 2021  [place] [facility] [place] [facility]  [place]  [place]  Emergency      medic n/v      Jun 06, 2021  [place] [facility] [place] [facility]  [place]  [place]  Emergency      Etoh intoxication      Alcohol Intoxication      Alcohol use, unspecified with intoxication, uncomplicated      Unspecified injury of lower back, initial encounter      May 19, 2021  [place] [facility] [place] [facility]  [place]  [place]  Emergency      Syncope      Syncope and collapse      Apr 13, 2021  [place] -  [place] [facility]  [place]  [place]  Emergency      Syncope      Fall      Unspecified injury of head, initial encounter      Syncope and collapse      Acute kidney failure, unspecified      Other specified abnormal findings of blood chemistry        Recent Inpatient Visit Summary  No Recent Inpatient Visits were found.  Care Team  Provider Specialty Phone Fax Service Dates   Rolland Bimler Case Manager/Care Coordinator [phone]  Current      Collective Portal  This patient has registered at the [facility] Emergency Department   For more information visit: [email or web address]     PLEASE NOTE:     1.   Any care recommendations and other clinical information are provided as guidelines or for historical purposes only, and providers should exercise their own clinical judgment when providing care.    2.   You may only use this information for purposes of treatment, payment or health care operations activities, and subject to the limitations of applicable [employer] Policies.    3.  You should consult directly with the organization that provided a care guideline or other clinical history with any questions about additional information or accuracy or completeness of information provided.    ? 2022 [employer] - www.[email or web address]

## 2021-08-07 NOTE — ED Provider Notes (Signed)
[facility] EMERGENCY DEPARTMENT  ATTENDING PHYSICIAN ATTESTATION NOTE     Patient Name: [name], [name]  Encounter Date:  08/07/2021  Attending Physician: Jodene Nam, MD MPH  Room:  N 39/N 39  Patient DOB:  [birth year]  Age: 87 y.o. male  MRN:  [id number]  PCP: [name], MD               Nursing Triage note: Patient arrived via EMS for c/c of nausea, vomiting, and generalized weakness. Patient went to dinner, and began having sudden onset nausea with vomiting, and feeling weak; was not able to eat dinner. AAOx4. T2DM BGL 91mg /dl Given 4mg  zofran en route.    Pulse 72  BP 94/44  Resp 16  SpO2 100 %  Temp 97.6 F (36.4 C)    HPI/PE/MDM:   [name] is a 87 y.o. male brought in by EMS with past medical history of diabetes who presents with sudden onset of severe persistent abdominal pain that began just prior to arrival while at dinner. He did not get to eat dinner. He also had episodes of vomiting and is nauseas. Patient currently endorses that he feels better but has a mild stomach ache and is nauseas.     Patient with episode of nausea vomiting while at dinner today, some abdominal discomfort afterwards however resolved on my evaluation here.  Abdominal exam is benign, no focal tenderness, CT here shows no evidence of any intra-abdominal process, lab work reassuring.  Did have similar episode earlier this year, diagnosed with likely dehydration however here patient appears well-hydrated, labs do not suggest significant dehydration.  Given fluids, Zofran, felt well, continue to be asymptomatic, tolerating p.o.  On serial evaluation, no further pain, asymptomatic.  Suspect likely transient presyncope versus stomach upset related to food, feels well to go home, family comfortable with this, will plan discharge with close outpatient PMD follow-up.       Results       Procedure Component Value Units Date/Time    Urinalysis Reflex to Microscopic Exam- Reflex to Culture [[id number]]  (Abnormal) Collected:  08/07/21 2344    Specimen: Urine, Clean Catch Updated: 08/08/21 0010     Urine Type Urine, Clean Ca     Color, UA Yellow     Clarity, UA Clear     Specific Gravity UA 1.048     Urine pH 6.0     Leukocyte Esterase, UA Negative     Nitrite, UA Negative     Protein, UR 20= Trace     Glucose, UA Negative     Ketones UA Negative     Urobilinogen, UA Normal mg/dL      Bilirubin, UA Negative     Blood, UA Negative     RBC, UA 0-2 /hpf      WBC, UA 0-5 /hpf      Squamous Epithelial Cells, Urine 0-5 /hpf      Hyaline Casts, UA TNTC /lpf     Troponin I [[id number]] Collected: 08/07/21 1950    Specimen: Blood Updated: 08/07/21 2034     Troponin I <0.01 ng/mL     Comprehensive metabolic panel [[id number]]  (Abnormal) Collected: 08/07/21 1950    Specimen: Blood Updated: 08/07/21 2031     Glucose 86 mg/dL      BUN 96.2 mg/dL      Creatinine 1.5 mg/dL      Sodium 952 mEq/L      Potassium 4.2 mEq/L      Chloride 107 mEq/L  CO2 23 mEq/L      Calcium 9.3 mg/dL      Protein, Total 7.2 g/dL      Albumin 3.5 g/dL      AST (SGOT) 20 U/L      ALT 15 U/L      Alkaline Phosphatase 81 U/L      Bilirubin, Total 0.3 mg/dL      Globulin 3.7 g/dL      Albumin/Globulin Ratio 0.9     Anion Gap 10.0    GFR [[id number]] Collected: 08/07/21 1950     Updated: 08/07/21 2031     EGFR 53.5    CBC and differential [[id number]]  (Abnormal) Collected: 08/07/21 1950    Specimen: Blood Updated: 08/07/21 2016     WBC 7.81 x10 3/uL      Hgb 11.3 g/dL      Hematocrit 44.0 %      Platelets 220 x10 3/uL      RBC 3.56 x10 6/uL      MCV 96.6 fL      MCH 31.7 pg      MCHC 32.8 g/dL      RDW 13 %      MPV 10.1 fL      Neutrophils 77.8 %      Lymphocytes Automated 13.8 %      Monocytes 5.6 %      Eosinophils Automated 2.0 %      Basophils Automated 0.4 %      Immature Granulocytes 0.4 %      Nucleated RBC 0.0 /100 WBC      Neutrophils Absolute 6.07 x10 3/uL      Lymphocytes Absolute Automated 1.08 x10 3/uL      Monocytes Absolute Automated 0.44 x10 3/uL       Eosinophils Absolute Automated 0.16 x10 3/uL      Basophils Absolute Automated 0.03 x10 3/uL      Immature Granulocytes Absolute 0.03 x10 3/uL      Absolute NRBC 0.00 x10 3/uL     Glucose Whole Blood - POCT [[id number]] Collected: 08/07/21 1952     Updated: 08/07/21 1954     Whole Blood Glucose POCT 92 mg/dL             CT Abd/Pelvis with IV Contrast only    Result Date: 08/07/2021   1.  No findings to account for reported symptoms, nor other acute abnormality detected. 2.  Colonic diverticulosis. Mosetta Putt, MD  08/07/2021 9:44 PM    XR Chest  AP Portable    Result Date: 08/07/2021  No acute process Genelle Bal  08/07/2021 9:16 PM    EKG -             interpreted by me: normal sinus, normal axis, no STEMI    Attending Attestation:  The patient was seen and examined by the mid-level/resident and the plan of care was discussed with me. I agree with the plan as it was presented to me and was present during the key portions of any procedures performed.  I have reviewed and agree with the final ED diagnosis. Please see the separately documented midlevel/resident note for additional information including but not limited to full history of present illness, review of systems and comprehensive physical exam.    The patient's past medical records, including those in Care Everywhere when necessary, were reviewed by me.     Diagnosis/Disposition:     Final Impression  Final diagnoses:   Generalized abdominal pain  Nausea and vomiting, unspecified vomiting type     Disposition  ED Disposition       ED Disposition   Discharge    Condition   --    Date/Time   Sat Aug 08, 2021 12:14 AM    Comment   [name] discharge to home/self care.    Condition at disposition: Stable               Follow up  [name], MD  [address]  [place] [address]  [phone]    Schedule an appointment as soon as possible for a visit    Prescriptions  New Prescriptions    No medications on file          ATTESTATIONS     Jodene Nam,  MD MPH    Scribe Attestation:    I am the first provider for this patient and I personally performed the services documented. Sorayah Sherlene Shams is scribing for me on this chart. This note and the patient instructions accurately reflect work and decisions made by me.                       Eldridge Dace, MD  08/08/21 [phone]

## 2021-08-07 NOTE — ED Triage Notes (Signed)
Pt and friend state that they were eating at restaurant when the pt suddenly became nauseous, diaphoretic,  dizzy, and started vomiting. Pt currently has abdominal pain and slight nausea. Also reports numbness in the right knee- full movement of all extremities. A&Ox4. Denies CP/SOB.

## 2021-08-08 LAB — URINALYSIS REFLEX TO MICROSCOPIC EXAM - REFLEX TO CULTURE
Bilirubin, UA: NEGATIVE
Blood, UA: NEGATIVE
Glucose, UA: NEGATIVE
Ketones UA: NEGATIVE
Leukocyte Esterase, UA: NEGATIVE
Nitrite, UA: NEGATIVE
Specific Gravity UA: 1.048 — AB (ref 1.001–1.035)
Urine pH: 6 (ref 5.0–8.0)
Urobilinogen, UA: NORMAL mg/dL (ref 0.2–2.0)

## 2021-08-08 NOTE — ED Provider Notes (Signed)
[name]         CLINICAL SUMMARY          Diagnosis:    .     Final diagnoses:   Generalized abdominal pain   Nausea and vomiting, unspecified vomiting type         MDM Notes:      [name] is a 87 y.o. male w/ pmhx DM2 who presents with diffuse abdominal pain with nausea and vomiting, generalized weakness beginning about 2 hours prior to ED presentation.    DDx-              Initial differential diagnosis included: bowel obstruction, bower perforation, gastroenteritis, gastritis, ACS    Discussed with attending MD Hsu.   Patient appearing well.  Afebrile, vital signs stable.  Abdomen soft with diffuse mild tenderness.  No further vomiting in ED.  CBC without leukocytosis, CMP within normal limits.  Troponin is negative.  CT of abdomen and pelvis without acute findings.  Pt without further sx in ED. Tolerating PO.  No abdominal pain or nausea on re-evaluation.  UA negative for infection.  [place] home, f/u PCP.       Counseling: I have spoken with the patient and discussed today's findings, in addition to providing specific details for the plan of care. Questions are answered and the patient agrees with the plan. Specific return precautions discussed.     This patient was seen and evaluated during the SARS-CoV-2 pandemic.         Disposition:       ED Disposition       ED Disposition   Discharge    Condition   --    Date/Time   Sat Aug 08, 2021 12:14 AM    Comment   [name] discharge to home/self care.    Condition at disposition: Stable                 Discharge         Discharge Prescriptions    None                     CLINICAL INFORMATION        HPI:      Chief Complaint: Nausea, Emesis, and Generalized weakness  .    [name] is a 87 y.o. male w/ pmhx DM2 who presents with diffuse abdominal pain with nausea and vomiting, generalized weakness beginning about 2 hours prior to ED presentation.  Pt last ate around 10 am today, and had gone to dinner with friend  when he began having symptoms.  He reports several episodes of nonbloody emesis.   He currently feels slight nausea but feels much better overall.   Denies fever or chills.   Denies chest pain or shortness of breath.  Denies dizziness.  Denies diarrhea.    History of appendectomy.    History obtained from: Patient      ROS:      Review of Systems      Positive and negative ROS elements as per HPI.  All other systems reviewed and negative.      Physical Exam:      Pulse 72  BP 94/44  Resp 16  SpO2 100 %  Temp 97.6 F (36.4 C)    Physical Exam  Vitals and nursing note reviewed.   Constitutional:       General: He is not in acute distress.  Appearance: Normal appearance. He is well-developed. He is not diaphoretic.   HENT:      Head: Normocephalic and atraumatic.      Nose: Nose normal.   Eyes:      General: No scleral icterus.     Extraocular Movements: Extraocular movements intact.      Conjunctiva/sclera: Conjunctivae normal.   Neck:      Trachea: No tracheal deviation.   Cardiovascular:      Rate and Rhythm: Normal rate and regular rhythm.      Heart sounds: Normal heart sounds. No murmur heard.    No friction rub.   Pulmonary:      Effort: Pulmonary effort is normal. No respiratory distress.      Breath sounds: Normal breath sounds.   Abdominal:      General: There is no distension.      Palpations: Abdomen is soft.      Tenderness: There is abdominal tenderness (mild diffuse TTP). There is no guarding.   Musculoskeletal:         General: No tenderness. Normal range of motion.      Cervical back: Normal range of motion and neck supple.   Skin:     General: Skin is warm and dry.      Findings: No erythema or rash.   Neurological:      General: No focal deficit present.      Mental Status: He is alert and oriented to person, place, and time.      Motor: No abnormal muscle tone.      Coordination: Coordination normal.   Psychiatric:         Mood and Affect: Mood normal.         Behavior: Behavior normal.                PAST HISTORY        Primary Care Provider: [name], MD        PMH/PSH:    .     Past Medical History:   Diagnosis Date    Diabetes mellitus     No diagnosis        He has a past surgical history that includes APPENDECTOMY (OPEN); EGD, BIOPSY (N/A, 06/30/2015); and EGD, COLONOSCOPY (N/A, 08/04/2015).      Social/Family History:      He reports that he has been smoking cigarettes. He has been smoking an average of .5 packs per day. He uses smokeless tobacco. He reports current alcohol use. He reports that he does not use drugs.    History reviewed. No pertinent family history.      Listed Medications on Arrival:    .     Discharge Medication List as of 08/08/2021 12:15 AM        CONTINUE these medications which have NOT CHANGED    Details   acetaminophen (TYLENOL) 325 MG tablet Take 2 tablets (650 mg total) by mouth every 4 (four) hours as needed for Pain., Starting Sat 04/23/2017, No Print      meclizine (ANTIVERT) 12.5 MG tablet Take 1 tablet (12.5 mg total) by mouth 3 (three) times daily as needed for Dizziness For head Spinning, Starting Fri 05/22/2021, E-Rx      metFORMIN (GLUCOPHAGE) 1000 MG tablet Take 1 tablet (1,000 mg total) by mouth every morning with breakfast., Starting 08/05/2015, Until Discontinued, No Print            Allergies: He has No Known Allergies.  VISIT INFORMATION        Clinical Course in the ED:               Medications Given in the ED:    .     ED Medication Orders (From admission, onward)      Start Ordered     Status Ordering Provider    08/07/21 2200 08/07/21 2159    Once        Route: Intravenous  Ordered Dose: 4 mg     Discontinued Marliss Buttacavoli YOUNG    08/07/21 2120 08/07/21 2120  iohexol (OMNIPAQUE) 350 MG/ML injection 100 mL  IMG once as needed        Route: Intravenous  Ordered Dose: 100 mL     Last MAR action: Imaging Agent Given Eldridge Dace    08/07/21 1945 08/07/21 1944  sodium chloride 0.9 % bolus 1,000 mL  Once        Route: Intravenous  Ordered Dose: 1,000  mL     Last MAR action: Stopped Tawanna Sat M              Procedures:      Procedures      Interpretations:      O2 sat-           saturation: 100 %; Oxygen use: room air; Interpretation: Normal                   RESULTS        Lab Results:      Results       Procedure Component Value Units Date/Time    Urinalysis Reflex to Microscopic Exam- Reflex to Culture [[id number]]  (Abnormal) Collected: 08/07/21 2344    Specimen: Urine, Clean Catch Updated: 08/08/21 0010     Urine Type Urine, Clean Ca     Color, UA Yellow     Clarity, UA Clear     Specific Gravity UA 1.048     Urine pH 6.0     Leukocyte Esterase, UA Negative     Nitrite, UA Negative     Protein, UR 20= Trace     Glucose, UA Negative     Ketones UA Negative     Urobilinogen, UA Normal mg/dL      Bilirubin, UA Negative     Blood, UA Negative     RBC, UA 0-2 /hpf      WBC, UA 0-5 /hpf      Squamous Epithelial Cells, Urine 0-5 /hpf      Hyaline Casts, UA TNTC /lpf     Troponin I [[id number]] Collected: 08/07/21 1950    Specimen: Blood Updated: 08/07/21 2034     Troponin I <0.01 ng/mL     Comprehensive metabolic panel [[id number]]  (Abnormal) Collected: 08/07/21 1950    Specimen: Blood Updated: 08/07/21 2031     Glucose 86 mg/dL      BUN 08.6 mg/dL      Creatinine 1.5 mg/dL      Sodium 578 mEq/L      Potassium 4.2 mEq/L      Chloride 107 mEq/L      CO2 23 mEq/L      Calcium 9.3 mg/dL      Protein, Total 7.2 g/dL      Albumin 3.5 g/dL      AST (SGOT) 20 U/L      ALT 15 U/L      Alkaline Phosphatase 81 U/L  Bilirubin, Total 0.3 mg/dL      Globulin 3.7 g/dL      Albumin/Globulin Ratio 0.9     Anion Gap 10.0    GFR [[id number]] Collected: 08/07/21 1950     Updated: 08/07/21 2031     EGFR 53.5    CBC and differential [[id number]]  (Abnormal) Collected: 08/07/21 1950    Specimen: Blood Updated: 08/07/21 2016     WBC 7.81 x10 3/uL      Hgb 11.3 g/dL      Hematocrit 29.5 %      Platelets 220 x10 3/uL      RBC 3.56 x10 6/uL      MCV 96.6 fL      MCH 31.7 pg      MCHC  32.8 g/dL      RDW 13 %      MPV 10.1 fL      Neutrophils 77.8 %      Lymphocytes Automated 13.8 %      Monocytes 5.6 %      Eosinophils Automated 2.0 %      Basophils Automated 0.4 %      Immature Granulocytes 0.4 %      Nucleated RBC 0.0 /100 WBC      Neutrophils Absolute 6.07 x10 3/uL      Lymphocytes Absolute Automated 1.08 x10 3/uL      Monocytes Absolute Automated 0.44 x10 3/uL      Eosinophils Absolute Automated 0.16 x10 3/uL      Basophils Absolute Automated 0.03 x10 3/uL      Immature Granulocytes Absolute 0.03 x10 3/uL      Absolute NRBC 0.00 x10 3/uL     Glucose Whole Blood - POCT [[id number]] Collected: 08/07/21 1952     Updated: 08/07/21 1954     Whole Blood Glucose POCT 92 mg/dL                 Radiology Results:      CT Abd/Pelvis with IV Contrast only   Final Result       1.  No findings to account for reported symptoms, nor other acute   abnormality detected.    2.  Colonic diverticulosis.      Mosetta Putt, MD    08/07/2021 9:44 PM      XR Chest  AP Portable   Final Result   No acute process      Genelle Bal    08/07/2021 9:16 PM                  Scribe Attestation:      No scribe involved in the care of this patient              Robin Searing, [place]  08/08/21 1929

## 2021-08-08 NOTE — Discharge Instructions (Signed)
Dear Mr. Norden:    Thank you for choosing the [facility] Emergency Department, the premier emergency department in the [place] area.  I hope your visit today was EXCELLENT. You will receive a survey via text message that will give you the opportunity to provide feedback to your team about your visit. Please do not hesitate to reach out with any questions!    Specific instructions for your visit today:    Abdominal Pain     You have been diagnosed with abdominal (belly) pain. The cause of your pain is not yet known.     Many things can cause abdominal pain such as infections and bowel (intestine) spasms. You might need another examination or more tests to find out why you have pain.     At this time, your pain does not seem to be caused by anything dangerous. You do not need surgery. You do not need to stay in the hospital.      Though we dont believe your condition is dangerous right now, it is important to be careful. Sometimes a problem that seems mild now can become serious later. If you do not get completely better or your symptoms get worse, you should seek more care. This is why it is important that you get additional help unless you are 100% improved  Follow up with your doctor.     For the next 24 hours, Drink only clear liquids such as:  Water.  Clear broth.  Sports drinks.  Clear caffeine-free soft drinks, like 7-Up or Sprite.     Return here or go to the nearest Emergency Department immediately if:  Your pain does not go away or gets worse.  You cannot keep fluids down   Your vomit (throw up) is dark green.   You vomit (throw up) blood or see blood in your stool (poop). Blood might be bright red or dark red. It can also be black and look like tar.  You have a fever (temperature higher than 100.11F / 38C) or shaking chills.  Your skin or eyes look yellow.  Your urine looks brown.  You have severe diarrhea.     If you can't follow up with your doctor, or if at any time you feel you need to be  rechecked or seen again, come back here or go to the nearest emergency department.                 IF YOU DO NOT CONTINUE TO IMPROVE OR YOUR CONDITION WORSENS, PLEASE CONTACT YOUR DOCTOR OR RETURN IMMEDIATELY TO THE EMERGENCY DEPARTMENT.    Sincerely,  Eldridge Dace, MD  Attending Emergency Physician  [facility] Emergency Department      OBTAINING A PRIMARY CARE APPOINTMENT    Primary care physicians (PCPs, also known as primary care doctors) are either internists or family medicine doctors. Both types of PCPs focus on health promotion, disease prevention, patient education and counseling, and treatment of acute and chronic medical conditions.    If you need a primary care doctor, please call the below number and ask who is receiving new patients.     [facility]  Telephone:  [phone]  [email or web address]    DOCTOR REFERRALS  Call [phone] (available 24 hours a day, 7 days a week) if you need any further referrals and we can help you find a primary care doctor or specialist.  Also, available online at:  [email or web address]    YOUR CONTACT INFORMATION  Before leaving please check with registration to make sure we have an up-to-date contact number.  You can call registration at [phone] to update your information.  For questions about your hospital bill, please call [phone].  For questions about your Emergency Dept Physician bill please call [phone].      [place]  If you need help with health or social services, please call 2-1-1 for a free referral to resources in your area.  2-1-1 is a free service connecting people with information on health insurance, free clinics, pregnancy, mental health, dental care, food assistance, housing, and substance abuse counseling.  Also, available online at:  http://www.[place].org    ORTHOPEDIC INJURY   Please know that significant injuries can exist even when an initial x-ray is read as normal or  negative.  This can occur because some fractures (broken bones) are not initially visible on x-rays.  For this reason, close outpatient follow-up with your primary care doctor or bone specialist (orthopedist) is required.    MEDICATIONS AND FOLLOWUP  Please be aware that some prescription medications can cause drowsiness.  Use caution when driving or operating machinery.    The examination and treatment you have received in our Emergency Department is provided on an emergency basis, and is not intended to be a substitute for your primary care physician.  It is important that your doctor checks you again and that you report any new or remaining problems at that time.      ASSISTANCE WITH INSURANCE    Affordable Care Act  [facility])  Call to start or finish an application, compare plans, enroll or ask a question.  [place]: [phone]  Web:  Healthcare.gov    Help Enrolling in [place]  (507)833-5263 (TOLL-FREE)  641-118-0785 (TTY)  Web:  Http://www.coverva.org    Local Help Enrolling in the [place]  786-512-1588 (MAIN)  Email:  health-help'@nvfs'$ .org  Web:  http://lewis-perez.info/  Address:  [id number] [place],  [address]    SEDATING MEDICATIONS  Sedating medications include strong pain medications (e.g. narcotics), muscle relaxers, benzodiazepines (used for anxiety and as muscle relaxers), Benadryl/diphenhydramine and other antihistamines for allergic reactions/itching, and other medications.  If you are unsure if you have received a sedating medication, please ask your physician or nurse.  If you received a sedating medication: DO NOT drive a car. DO NOT operate machinery. DO NOT perform jobs where you need to be alert.  DO NOT drink alcoholic beverages while taking this medicine.     If you get dizzy, sit or lie down at the first signs. Be careful going up and down stairs.  Be extra careful to prevent falls.     Never give this medicine  to others.     Keep this medicine out of reach of children.     Do not take or save old medicines. Throw them away when outdated.     Keep all medicines in a cool, dry place. DO NOT keep them in your bathroom medicine cabinet or in a cabinet above the stove.    MEDICATION REFILLS  Please be aware that we cannot refill any prescriptions through the ER. If you need further treatment from what is provided at your ER visit, please follow up with your primary care doctor or your pain management specialist.    [place]  Did you know [facility] has two freestanding ERs located  just a few miles away?  [facility] [place] and [facility] [place] have short wait times, easy free parking directly in front of the building and top patient satisfaction scores - and the same Board Certified Emergency Medicine doctors as [facility].

## 2022-02-02 ENCOUNTER — Emergency Department: Payer: Medicare Other

## 2022-02-02 ENCOUNTER — Observation Stay
Admission: EM | Admit: 2022-02-02 | Discharge: 2022-02-05 | Disposition: A | Discharge status: Home / Self Care | Payer: Medicare Other | Attending: Internal Medicine | Admitting: Internal Medicine

## 2022-02-02 DIAGNOSIS — R0689 Other abnormalities of breathing: Secondary | ICD-10-CM | POA: Insufficient documentation

## 2022-02-02 DIAGNOSIS — E785 Hyperlipidemia, unspecified: Secondary | ICD-10-CM | POA: Insufficient documentation

## 2022-02-02 DIAGNOSIS — Z7984 Long term (current) use of oral hypoglycemic drugs: Secondary | ICD-10-CM | POA: Insufficient documentation

## 2022-02-02 DIAGNOSIS — E1165 Type 2 diabetes mellitus with hyperglycemia: Secondary | ICD-10-CM | POA: Insufficient documentation

## 2022-02-02 DIAGNOSIS — Z79899 Other long term (current) drug therapy: Secondary | ICD-10-CM | POA: Insufficient documentation

## 2022-02-02 DIAGNOSIS — N400 Enlarged prostate without lower urinary tract symptoms: Secondary | ICD-10-CM | POA: Insufficient documentation

## 2022-02-02 DIAGNOSIS — F17210 Nicotine dependence, cigarettes, uncomplicated: Secondary | ICD-10-CM | POA: Insufficient documentation

## 2022-02-02 DIAGNOSIS — I6782 Cerebral ischemia: Secondary | ICD-10-CM | POA: Insufficient documentation

## 2022-02-02 DIAGNOSIS — Z20822 Contact with and (suspected) exposure to covid-19: Secondary | ICD-10-CM | POA: Insufficient documentation

## 2022-02-02 DIAGNOSIS — D649 Anemia, unspecified: Secondary | ICD-10-CM | POA: Insufficient documentation

## 2022-02-02 DIAGNOSIS — E8729 Other acidosis: Secondary | ICD-10-CM | POA: Diagnosis present

## 2022-02-02 DIAGNOSIS — I951 Orthostatic hypotension: Secondary | ICD-10-CM | POA: Insufficient documentation

## 2022-02-02 DIAGNOSIS — N179 Acute kidney failure, unspecified: Secondary | ICD-10-CM | POA: Insufficient documentation

## 2022-02-02 DIAGNOSIS — E860 Dehydration: Principal | ICD-10-CM | POA: Insufficient documentation

## 2022-02-02 DIAGNOSIS — I10 Essential (primary) hypertension: Secondary | ICD-10-CM | POA: Insufficient documentation

## 2022-02-02 DIAGNOSIS — R079 Chest pain, unspecified: Secondary | ICD-10-CM

## 2022-02-02 DIAGNOSIS — Z8673 Personal history of transient ischemic attack (TIA), and cerebral infarction without residual deficits: Secondary | ICD-10-CM | POA: Insufficient documentation

## 2022-02-02 LAB — COMPREHENSIVE METABOLIC PANEL
ALT: 11 U/L (ref 0–55)
AST (SGOT): 13 U/L (ref 5–41)
Albumin/Globulin Ratio: 1 (ref 0.9–2.2)
Albumin: 3 g/dL — ABNORMAL LOW (ref 3.5–5.0)
Alkaline Phosphatase: 84 U/L (ref 37–117)
Anion Gap: 6 (ref 5.0–15.0)
BUN: 19 mg/dL (ref 9.0–28.0)
Bilirubin, Total: 0.3 mg/dL (ref 0.2–1.2)
CO2: 24 mEq/L (ref 17–29)
Calcium: 8.1 mg/dL (ref 7.9–10.2)
Chloride: 102 mEq/L (ref 99–111)
Creatinine: 1.6 mg/dL — ABNORMAL HIGH (ref 0.5–1.5)
Globulin: 2.9 g/dL (ref 2.0–3.6)
Glucose: 266 mg/dL — ABNORMAL HIGH (ref 70–100)
Potassium: 5.1 mEq/L (ref 3.5–5.3)
Protein, Total: 5.9 g/dL — ABNORMAL LOW (ref 6.0–8.3)
Sodium: 132 mEq/L — ABNORMAL LOW (ref 135–145)

## 2022-02-02 LAB — CBC AND DIFFERENTIAL
Absolute NRBC: 0 10*3/uL (ref 0.00–0.00)
Basophils Absolute Automated: 0.03 10*3/uL (ref 0.00–0.08)
Basophils Automated: 0.4 %
Eosinophils Absolute Automated: 0.23 10*3/uL (ref 0.00–0.44)
Eosinophils Automated: 3.2 %
Hematocrit: 30.1 % — ABNORMAL LOW (ref 37.6–49.6)
Hgb: 9.9 g/dL — ABNORMAL LOW (ref 12.5–17.1)
Immature Granulocytes Absolute: 0.01 10*3/uL (ref 0.00–0.07)
Immature Granulocytes: 0.1 %
Instrument Absolute Neutrophil Count: 5.19 10*3/uL (ref 1.10–6.33)
Lymphocytes Absolute Automated: 1.32 10*3/uL (ref 0.42–3.22)
Lymphocytes Automated: 18.3 %
MCH: 31 pg (ref 25.1–33.5)
MCHC: 32.9 g/dL (ref 31.5–35.8)
MCV: 94.4 fL (ref 78.0–96.0)
MPV: 10.1 fL (ref 8.9–12.5)
Monocytes Absolute Automated: 0.45 10*3/uL (ref 0.21–0.85)
Monocytes: 6.2 %
Neutrophils Absolute: 5.19 10*3/uL (ref 1.10–6.33)
Neutrophils: 71.8 %
Nucleated RBC: 0 /100 WBC (ref 0.0–0.0)
Platelets: 217 10*3/uL (ref 142–346)
RBC: 3.19 10*6/uL — ABNORMAL LOW (ref 4.20–5.90)
RDW: 13 % (ref 11–15)
WBC: 7.23 10*3/uL (ref 3.10–9.50)

## 2022-02-02 LAB — GFR: EGFR: 49.6

## 2022-02-02 LAB — BLOOD GAS, VENOUS
Base Excess, Ven: -2.3 mEq/L
HCO3, Ven: 24.5 mEq/L
O2 Sat, Venous: 92.4 %
Temperature: 37
Venous Total CO2: 51.9 mEq/L
pCO2, Venous: 54.7 mmhg
pH, Ven: 7.273
pO2, Venous: 94.9 mmhg

## 2022-02-02 LAB — GLUCOSE WHOLE BLOOD - POCT: Whole Blood Glucose POCT: 256 mg/dL — ABNORMAL HIGH (ref 70–100)

## 2022-02-02 LAB — COVID-19 (SARS-COV-2): SARS CoV 2 Overall Result: NOT DETECTED

## 2022-02-02 LAB — HIGH SENSITIVITY TROPONIN-I: hs Troponin-I: <2.7 ng/L

## 2022-02-02 LAB — AMMONIA: Ammonia: 29 umol/L (ref 18–72)

## 2022-02-02 LAB — TSH: TSH: 1.18 u[IU]/mL (ref 0.35–4.94)

## 2022-02-02 MED ORDER — SODIUM CHLORIDE 0.9 % IV BOLUS
1000.0000 mL | Freq: Once | INTRAVENOUS | Status: AC
Start: 2022-02-02 — End: 2022-02-02
  Administered 2022-02-02: 1000 mL via INTRAVENOUS

## 2022-02-02 MED ORDER — POTASSIUM CHLORIDE CRYS ER 20 MEQ PO TBCR
0.0000 meq | EXTENDED_RELEASE_TABLET | ORAL | Status: DC | PRN
Start: 2022-02-02 — End: 2022-02-05

## 2022-02-02 MED ORDER — POTASSIUM CHLORIDE 10 MEQ/100ML IV SOLN
10.0000 meq | INTRAVENOUS | Status: DC | PRN
Start: 2022-02-02 — End: 2022-02-05

## 2022-02-02 MED ORDER — INSULIN LISPRO 100 UNIT/ML SOLN (WRAP)
1.0000 [IU] | Freq: Three times a day (TID) | Status: DC
Start: 2022-02-03 — End: 2022-02-05
  Administered 2022-02-03: 1 [IU] via SUBCUTANEOUS
  Administered 2022-02-05: 3 [IU] via SUBCUTANEOUS
  Filled 2022-02-02: qty 3
  Filled 2022-02-02: qty 9

## 2022-02-02 MED ORDER — SODIUM CHLORIDE 0.9 % IV SOLN
INTRAVENOUS | Status: AC
Start: 2022-02-02 — End: 2022-02-03

## 2022-02-02 MED ORDER — INSULIN LISPRO 100 UNIT/ML SOLN (WRAP)
1.0000 [IU] | Freq: Every evening | Status: DC
Start: 2022-02-02 — End: 2022-02-05
  Administered 2022-02-04: 1 [IU] via SUBCUTANEOUS
  Filled 2022-02-02: qty 3

## 2022-02-02 MED ORDER — DEXTROSE 10 % IV BOLUS
12.5000 g | INTRAVENOUS | Status: DC | PRN
Start: 2022-02-02 — End: 2022-02-03

## 2022-02-02 MED ORDER — MAGNESIUM SULFATE IN D5W 1-5 GM/100ML-% IV SOLN
1.0000 g | INTRAVENOUS | Status: DC | PRN
Start: 2022-02-02 — End: 2022-02-05

## 2022-02-02 MED ORDER — HEPARIN SODIUM (PORCINE) 5000 UNIT/ML IJ SOLN
5000.0000 [IU] | Freq: Three times a day (TID) | INTRAMUSCULAR | Status: DC
Start: 2022-02-02 — End: 2022-02-05
  Administered 2022-02-03 – 2022-02-05 (×6): 5000 [IU] via SUBCUTANEOUS
  Filled 2022-02-02 (×7): qty 1

## 2022-02-02 MED ORDER — DEXTROSE 50 % IV SOLN
12.5000 g | INTRAVENOUS | Status: DC | PRN
Start: 2022-02-02 — End: 2022-02-03

## 2022-02-02 MED ORDER — GLUCAGON 1 MG IJ SOLR (WRAP)
1.0000 mg | INTRAMUSCULAR | Status: DC | PRN
Start: 2022-02-02 — End: 2022-02-03

## 2022-02-02 MED ORDER — GLUCOSE 40 % PO GEL (WRAP)
15.0000 g | ORAL | Status: DC | PRN
Start: 2022-02-02 — End: 2022-02-03

## 2022-02-02 MED ORDER — NALOXONE HCL 0.4 MG/ML IJ SOLN (WRAP)
0.2000 mg | INTRAMUSCULAR | Status: DC | PRN
Start: 2022-02-02 — End: 2022-02-05

## 2022-02-02 MED ORDER — POTASSIUM & SODIUM PHOSPHATES 280-160-250 MG PO PACK
2.0000 | PACK | ORAL | Status: DC | PRN
Start: 2022-02-02 — End: 2022-02-05

## 2022-02-02 MED ORDER — MELATONIN 3 MG PO TABS
3.0000 mg | ORAL_TABLET | Freq: Every evening | ORAL | Status: DC | PRN
Start: 2022-02-02 — End: 2022-02-05

## 2022-02-02 NOTE — ED Triage Notes (Signed)
[name] is a 88 y.o. male arrives to the ED by EMS after suffering from a sudden onset of dizziness. Patient told EMS he got off the bus and experienced sudden marked dizziness. He reportedly slowly eased himself down onto the grass and sat down to avoid falling. Denies hitting himself while going down. A passerby approached him to help and gave him water after the patient told him he felt thirsty. Patient drank a liter of water. Has a history of diabeted and is compliant with medications. By EMS, blood glucose resulted in 339 mg/dL, he has been responsive and oriented. Patient has been calm and compliant, A&OX4.

## 2022-02-02 NOTE — ED Notes (Signed)
[facility] ED NURSING NOTE FOR THE RECEIVING INPATIENT NURSE  -Update-      The following information is an update to the previous [facility] ED Nursing Admission Note:    CPAP just for sleep per Dr. Threasa Beards

## 2022-02-02 NOTE — ED Notes (Signed)
[facility] EMERGENCY DEPARTMENT  ED NURSING NOTE FOR THE RECEIVING INPATIENT NURSE   ED NURSE Hilario Quarry   [facility] [address]   ED CHARGE RN [phone]   ADMISSION INFORMATION   [name] is a 88 y.o. male admitted with a diagnosis of:    1. CO2 retention         Isolation: None   Allergies: Patient has no known allergies.   yes yes   Belongings Documented? Yes   Home medications sent to pharmacy confirmed? No   NURSING CARE   Patient Comes From:   Mental Status: Home Independent  alert and oriented   ADL: Needs assistance with ADLs   Ambulation: mild difficulty   Pertinent Information  and Safety Concerns: Assist patient on ambulation.     COVID Test sent to lab? Yes   VITAL SIGNS   Time BP Temp Pulse Resp SpO2   2305 158/71 97.9 67 18 99   CT / NIH   CT Head ordered on this patient?  Yes   NIH/Dysphagia assessment done prior to admission? No   PERSONAL PROTECTIVE EQUIPMENT   None   LAB RESULTS   Labs Reviewed   CBC AND DIFFERENTIAL - Abnormal; Notable for the following components:       Result Value    Hgb 9.9 (*)     Hematocrit 30.1 (*)     RBC 3.19 (*)     All other components within normal limits   COMPREHENSIVE METABOLIC PANEL - Abnormal; Notable for the following components:    Glucose 266 (*)     Creatinine 1.6 (*)     Sodium 132 (*)     Protein, Total 5.9 (*)     Albumin 3.0 (*)     All other components within normal limits   GLUCOSE WHOLE BLOOD - POCT - Abnormal; Notable for the following components:    Whole Blood Glucose POCT 256 (*)     All other components within normal limits   COVID-19 (SARS-COV-2)    Narrative:     o Collect and clearly label specimen type:  o PREFERRED-Upper respiratory specimen: One Nasal Swab in  Transport Media.  o Hand deliver to laboratory ASAP  Indication for testing->Extended care facility admission to  semi private room  Screening   HIGH SENSITIVITY TROPONIN-I   GFR   BLOOD GAS, VENOUS   AMMONIA   TSH          Ticket to Ride Printed: No

## 2022-02-02 NOTE — ED Provider Notes (Signed)
EMERGENCY DEPARTMENT HISTORY AND PHYSICAL EXAM    Date: 02/02/2022  Patient Name: [name]    This note was generated by the Epic EMR system/ Dragon speech recognition and may contain inherent errors or omissions not intended by the user. Grammatical errors, random word insertions, deletions and pronoun errors  are occasional consequences of this technology due to software limitations. Not all errors are caught or corrected. If there are questions or concerns about the content of this note or information contained within the body of this dictation they should be addressed directly with the author for clarification.    History of Presenting Illness     Chief Complaint   Patient presents with    Near syncope    Dizziness       Chief Complaint: Near syncope, generalized weakness    Additional History: [name] is a 88 y.o. male with history of diabetes who presents today for near syncope, generalized weakness.  Stated that he felt thirsty earlier today, and felt lightheaded while getting off a bus.  Drank a liter of water.  Patient was found to have a blood glucose of 339 by EMS.  He denies recent illness, fevers.  Denies abdominal pain, focal weakness.  Denies chest pain or shortness of breath.    I reviewed patient's last ED visit, clinic visit or admission/discharge summary, as well as associated recent EKGs, lab or imaging results, if applicable.     PCP: [name], MD    Past History   Past Medical History--reviewed, as per HPI  Past Surgical History--reviewed, no relevant history  Family History--reviewed, no relevant history  Social History--reviewed, no relevant history  Allergies reviewed and documented as pertinent to the case.       Physical Exam   BP 153/58   Pulse 74   Temp 97.7 F (36.5 C) (Oral)   Resp 20   Ht 5\' 9"  (1.753 m)   Wt 71.4 kg   SpO2 96%   BMI 23.26 kg/m     Physical Exam  Constitutional:       General: He is not in acute distress.     Comments: Somnolent appearing    HENT:      Head: Normocephalic and atraumatic.      Right Ear: External ear normal.      Left Ear: External ear normal.      Nose: Nose normal. No congestion.      Mouth/Throat:      Mouth: Mucous membranes are moist.      Pharynx: Oropharynx is clear.   Eyes:      Extraocular Movements: Extraocular movements intact.      Conjunctiva/sclera: Conjunctivae normal.   Cardiovascular:      Rate and Rhythm: Normal rate and regular rhythm.      Pulses: Normal pulses.      Heart sounds: Normal heart sounds.   Pulmonary:      Effort: Pulmonary effort is normal.      Breath sounds: Normal breath sounds.   Abdominal:      General: Abdomen is flat.      Palpations: Abdomen is soft.      Tenderness: There is no abdominal tenderness. There is no guarding.   Musculoskeletal:         General: No deformity. Normal range of motion.      Cervical back: Normal range of motion and neck supple.   Skin:     General: Skin is warm and dry.  Neurological:      General: No focal deficit present.      Mental Status: He is alert and oriented to person, place, and time.      Sensory: No sensory deficit.      Motor: No weakness.      Coordination: Coordination normal.   Psychiatric:         Mood and Affect: Mood normal.         Behavior: Behavior normal.           Diagnostic Study Results     Labs -  Reviewed, if relevant to the case.    Radiologic Studies -   Reviewed, if relevant to the case.      Medical Decision Making   I am the first provider for this patient.    I reviewed the vital signs, available nursing notes, past medical history, past surgical history, family history and social history.    Vital Signs: Reviewed the patient's vital signs during ED stay.    I reviewed pt's pulse oxymetry and cardiac monitor values, as relevant to the case.    Personal Protective Equipment (PPE)  Gloves, N95 and surgical mask.    Record Review: The following, if applicable to the case, were reviewed and noted:    Old medical records.  Nursing  notes.  Outside records.     Orders Placed This Encounter   Medications    sodium chloride 0.9 % bolus 1,000 mL    0.9% NaCl infusion    OR Linked Order Group     dextrose (GLUCOSE) 40 % oral gel 15 g of glucose     dextrose (D10W) 10% bolus 125 mL     dextrose 50 % bolus 12.5 g     glucagon (rDNA) (GLUCAGEN) injection 1 mg    insulin lispro injection 1-8 Units    insulin lispro injection 1-4 Units    meclizine (ANTIVERT) tablet 12.5 mg    acetaminophen (TYLENOL) tablet 650 mg    OR Linked Order Group     dextrose (GLUCOSE) 40 % oral gel 15 g of glucose     dextrose (D10W) 10% bolus 125 mL     dextrose 50 % bolus 12.5 g     glucagon (rDNA) (GLUCAGEN) injection 1 mg    melatonin tablet 3 mg    magnesium sulfate 1g in dextrose 5% IVPB (premix)    AND Linked Order Group     potassium chloride (KLOR-CON M20) CR tablet 0-40 mEq     potassium chloride 10 mEq in 100 mL IVPB (premix)    potassium & sodium phosphates (PHOS-NAK) 280-160-250 MG packet 2 packet    naloxone (NARCAN) injection 0.2 mg    heparin (porcine) injection 5,000 Units          Clinical Decision Support:       Critical Care Time:       Procedures:      For Hospitalized Patients:     1. Hospitalization Decision Time:  The decision to admit this patient was made by the emergency provider at 2303 on 02/02/2022      2. Core Measures:   - 12-lead EKG was performed in the ED. Aspirin: Aspirin was not given because the patient did not present with a stroke at the time of their Emergency Department evaluation.Marland Kitchen    SEP-1 Charting    A. ------------Severe Sepsis/Septic Shock Exclusion----------------------------    Pt is excluded from severe sepsis or septic shock consideration  at 2303 [enter time of bed request placement] on 02/03/2022 (date) because of one or more reasons below:    No SEP-1 qualifying organ dysfunction during ED stay.     The timestamp from the preceding paragraph above also applies to every infectious and/or SEP-1-related diagnosis in Clinical  Impression or MDM section of this note, as well as to any mention of the words "sepsis", "infection" or any infection/sepsis-related word or phrase in any part of this note.     ED Course:  Medical Decision Making  88 year old male presenting today for near syncope, generalized weakness.  Vital signs stable on arrival.  He is somnolent appearing.  Obtained work-up to evaluate for altered mental status.  CT head was normal.  Chest x-ray normal.  His lab work shows that he is retaining CO2.  Etiology for this is unclear.  He has no history of OSA.  Low suspicion for drug intoxication.  Given that he is retaining CO2, discussed case with the hospitalist, who excepted the patient to their service for further management.  Patient was admitted in stable condition.    Amount and/or Complexity of Data Reviewed  Labs: ordered.  Radiology: ordered.  ECG/medicine tests: ordered.    Risk  Prescription drug management.  Decision regarding hospitalization.          Diagnosis     Clinical Impression:   1. CO2 retention        Disposition:   ED Disposition       ED Disposition   Admit    Condition   --    Date/Time   Tue Feb 02, 2022 11:03 PM    Comment   Admitting Physician: Sharlette Dense [id number]   Service:: Medicine [106]   Estimated Length of Stay: > or = to 2 midnights   Tentative Discharge Plan?: Home or Self Care [1]   Does patient need telemetry?: Yes   Is patient 18 yrs or greater?: Yes   Telemetry type (separate Telemetry order is also required):: Adult telemetry                 The above diagnostic process was due to medical necessity based on risk stratification of potential harm of patient's presenting complaint.     Attestations: This note is prepared by Myrtie Soman, MD. I am the first provider for this patient.    Electronically signed by Myrtie Soman, MD     Myrtie Soman, MD  02/03/22 [phone]

## 2022-02-02 NOTE — EDIE (Signed)
COLLECTIVE?NOTIFICATION?02/02/2022 19:37?[name]?MRN: [id number]    Criteria Met      History of Alcohol Use Disorder (12 mo.)    5 ED Visits in 12 Months    Security and Safety  No Security Events were found.  ED Care Guidelines  There are currently no ED Care Guidelines for this patient. Please check your facility's medical records system.        Prescription Monitoring Program  370??- Narcotic Use Score  140??- Sedative Use Score  000??- Stimulant Use Score  170??- Overdose Risk Score  - All Scores range from 000-999 with 75% of the population scoring < 200 and on 1% scoring above 650  - The last digit of the narcotic, sedative, and stimulant score indicates the number of active prescriptions of that type  - Higher Use scores correlate with increased prescribers, pharmacies, mg equiv, and overlapping prescriptions  - Higher Overdose Risk Scores correlate with increased risk of unintentional overdose death   Concerning or unexpectedly high scores should prompt a review of the PMP record; this does not constitute checking PMP for prescribing purposes.    E.D. Visit Count (12 mo.)  Facility Visits   [place] [facility] 4   [facility] 1   Total 5   Note: Visits indicate total known visits.     Recent Emergency Department Visit Summary  Date Facility [facility] Type Diagnoses or Chief Complaint    Feb 02, 2022  [place] [facility] [place] [facility]  [place]  [place]  Emergency      near syncope, dizziness      Aug 07, 2021  [place] [facility] [place] [facility]  [place]  [place]  Emergency      medic n/v      Emesis      Generalized weakness      Nausea      Generalized abdominal pain      Nausea with vomiting, unspecified      Bitten by dog, initial encounter      Jun 06, 2021  [place] - [place] [facility]  [place]  [place]  Emergency      Etoh intoxication      Alcohol Intoxication      Alcohol use, unspecified with intoxication, uncomplicated      Unspecified injury of lower back, initial encounter      May 19, 2021  [place] - [place] [facility]  [place]   [place]  Emergency      Syncope      Syncope and collapse      Apr 13, 2021   - [place] [facility]  [place]  [place]  Emergency      Syncope      Fall      Unspecified injury of head, initial encounter      Syncope and collapse      Acute kidney failure, unspecified      Other specified abnormal findings of blood chemistry        Recent Inpatient Visit Summary  No Recent Inpatient Visits were found.  Care Team  Provider Specialty Phone Fax Service Dates   Rolland Bimler Case Manager/Care Coordinator [phone]  Current      Collective Portal  This patient has registered at the [facility] Emergency Department   For more information visit: [email or web address]     PLEASE NOTE:     1.   Any care recommendations and other clinical information are provided as guidelines or for historical purposes only, and providers  should exercise their own clinical judgment when providing care.    2.   You may only use this information for purposes of treatment, payment or health care operations activities, and subject to the limitations of applicable [employer] Polic[employer].    3.   You should consult directly with the organization that provided a care guideline or other clinical history with any questions about additional information or accuracy or completeness of information provided.    ? 2023 [employer] - www.collectivemedical.com

## 2022-02-02 NOTE — ED Notes (Signed)
Bed: GR10  Expected date: 02/02/22  Expected time: 7:36 PM  Means of arrival: [facility] [place]  Comments:

## 2022-02-03 LAB — BASIC METABOLIC PANEL
Anion Gap: 4 — ABNORMAL LOW (ref 5.0–15.0)
BUN: 15 mg/dL (ref 9.0–28.0)
CO2: 24 mEq/L (ref 17–29)
Calcium: 8 mg/dL (ref 7.9–10.2)
Chloride: 110 mEq/L (ref 99–111)
Creatinine: 1.2 mg/dL (ref 0.5–1.5)
Glucose: 206 mg/dL — ABNORMAL HIGH (ref 70–100)
Potassium: 4.3 mEq/L (ref 3.5–5.3)
Sodium: 138 mEq/L (ref 135–145)

## 2022-02-03 LAB — BLOOD GAS, ARTERIAL
Arterial Total CO2: 49.5 mEq/L — ABNORMAL HIGH (ref 24.0–30.0)
Base Excess, Arterial: -0.1 mEq/L (ref <=2.0)
FIO2: 21 %
HCO3, Arterial: 24.1 mEq/L (ref 23.0–29.0)
O2 Sat, Arterial: 95.3 % (ref 95.0–100.0)
Temperature: 37
pCO2, Arterial: 39.8 mmhg (ref 35.0–45.0)
pH, Arterial: 7.399 (ref 7.350–7.450)
pO2, Arterial: 85.5 mmhg (ref 80.0–90.0)

## 2022-02-03 LAB — CBC
Absolute NRBC: 0 10*3/uL (ref 0.00–0.00)
Hematocrit: 31 % — ABNORMAL LOW (ref 37.6–49.6)
Hgb: 10.4 g/dL — ABNORMAL LOW (ref 12.5–17.1)
MCH: 31.7 pg (ref 25.1–33.5)
MCHC: 33.5 g/dL (ref 31.5–35.8)
MCV: 94.5 fL (ref 78.0–96.0)
MPV: 9.6 fL (ref 8.9–12.5)
Nucleated RBC: 0 /100 WBC (ref 0.0–0.0)
Platelets: 210 10*3/uL (ref 142–346)
RBC: 3.28 10*6/uL — ABNORMAL LOW (ref 4.20–5.90)
RDW: 13 % (ref 11–15)
WBC: 6.62 10*3/uL (ref 3.10–9.50)

## 2022-02-03 LAB — IRON PROFILE
Iron Saturation: 35 % (ref 15–50)
Iron: 87 ug/dL (ref 40–160)
TIBC: 248 ug/dL — ABNORMAL LOW (ref 261–462)
UIBC: 161 ug/dL (ref 126–382)

## 2022-02-03 LAB — ECG 12-LEAD
Atrial Rate: 64 {beats}/min
IHS MUSE NARRATIVE AND IMPRESSION: NORMAL
P Axis: 48 degrees
P-R Interval: 200 ms
Q-T Interval: 396 ms
QRS Duration: 86 ms
QTC Calculation (Bezet): 408 ms
R Axis: 13 degrees
T Axis: 28 degrees
Ventricular Rate: 64 {beats}/min

## 2022-02-03 LAB — GLUCOSE WHOLE BLOOD - POCT
Whole Blood Glucose POCT: 114 mg/dL — ABNORMAL HIGH (ref 70–100)
Whole Blood Glucose POCT: 110 mg/dL — ABNORMAL HIGH (ref 70–100)
Whole Blood Glucose POCT: 200 mg/dL — ABNORMAL HIGH (ref 70–100)
Whole Blood Glucose POCT: 171 mg/dL — ABNORMAL HIGH (ref 70–100)
Whole Blood Glucose POCT: 147 mg/dL — ABNORMAL HIGH (ref 70–100)

## 2022-02-03 LAB — GFR: EGFR: >60

## 2022-02-03 LAB — VITAMIN B12: Vitamin B-12: 273 pg/mL (ref 211–911)

## 2022-02-03 LAB — HEMOLYSIS INDEX: Hemolysis Index: 3 Index (ref 0–24)

## 2022-02-03 LAB — FOLATE: Folate: 9.6 ng/mL

## 2022-02-03 MED ORDER — METFORMIN HCL 500 MG PO TABS
500.0000 mg | ORAL_TABLET | Freq: Two times a day (BID) | ORAL | Status: DC
Start: 2022-02-03 — End: 2022-02-05
  Administered 2022-02-03 – 2022-02-05 (×5): 500 mg via ORAL
  Filled 2022-02-03 (×5): qty 1

## 2022-02-03 MED ORDER — POTASSIUM & SODIUM PHOSPHATES 280-160-250 MG PO PACK
2.0000 | PACK | ORAL | Status: DC | PRN
Start: 2022-02-03 — End: 2022-02-03

## 2022-02-03 MED ORDER — ACETAMINOPHEN 325 MG PO TABS
650.0000 mg | ORAL_TABLET | ORAL | Status: DC | PRN
Start: 2022-02-03 — End: 2022-02-05

## 2022-02-03 MED ORDER — POTASSIUM CHLORIDE 10 MEQ/100ML IV SOLN
10.0000 meq | INTRAVENOUS | Status: DC | PRN
Start: 2022-02-03 — End: 2022-02-03

## 2022-02-03 MED ORDER — MECLIZINE HCL 12.5 MG PO TABS
12.5000 mg | ORAL_TABLET | Freq: Three times a day (TID) | ORAL | Status: DC | PRN
Start: 2022-02-03 — End: 2022-02-05

## 2022-02-03 MED ORDER — DEXTROSE 50 % IV SOLN
12.5000 g | INTRAVENOUS | Status: DC | PRN
Start: 2022-02-03 — End: 2022-02-05

## 2022-02-03 MED ORDER — LOSARTAN POTASSIUM 25 MG PO TABS
25.0000 mg | ORAL_TABLET | Freq: Every day | ORAL | Status: DC
Start: 2022-02-03 — End: 2022-02-05
  Administered 2022-02-03 – 2022-02-05 (×3): 25 mg via ORAL
  Filled 2022-02-03 (×3): qty 1

## 2022-02-03 MED ORDER — DEXTROSE 10 % IV BOLUS
12.5000 g | INTRAVENOUS | Status: DC | PRN
Start: 2022-02-03 — End: 2022-02-05

## 2022-02-03 MED ORDER — NALOXONE HCL 0.4 MG/ML IJ SOLN (WRAP)
0.2000 mg | INTRAMUSCULAR | Status: DC | PRN
Start: 2022-02-03 — End: 2022-02-03

## 2022-02-03 MED ORDER — GLUCAGON 1 MG IJ SOLR (WRAP)
1.0000 mg | INTRAMUSCULAR | Status: DC | PRN
Start: 2022-02-03 — End: 2022-02-05

## 2022-02-03 MED ORDER — MELATONIN 3 MG PO TABS
3.0000 mg | ORAL_TABLET | Freq: Every evening | ORAL | Status: DC | PRN
Start: 2022-02-03 — End: 2022-02-03

## 2022-02-03 MED ORDER — MAGNESIUM SULFATE IN D5W 1-5 GM/100ML-% IV SOLN
1.0000 g | INTRAVENOUS | Status: DC | PRN
Start: 2022-02-03 — End: 2022-02-03

## 2022-02-03 MED ORDER — POTASSIUM CHLORIDE CRYS ER 20 MEQ PO TBCR
0.0000 meq | EXTENDED_RELEASE_TABLET | ORAL | Status: DC | PRN
Start: 2022-02-03 — End: 2022-02-03

## 2022-02-03 MED ORDER — GLUCOSE 40 % PO GEL (WRAP)
15.0000 g | ORAL | Status: DC | PRN
Start: 2022-02-03 — End: 2022-02-05

## 2022-02-03 MED ORDER — HYDRALAZINE HCL 20 MG/ML IJ SOLN
10.0000 mg | Freq: Four times a day (QID) | INTRAMUSCULAR | Status: DC | PRN
Start: 2022-02-03 — End: 2022-02-05
  Administered 2022-02-04: 10 mg via INTRAVENOUS
  Filled 2022-02-03: qty 1

## 2022-02-03 NOTE — H&P (Addendum)
History and Physical Exam      Date Time: 02/03/22 5:34 AM  Patient Name: [name]  Attending Physician: Sharlette Dense, MD      Assessment:   Dizziness   CO2 retention  Acute kidney injury / Dehydration  Anemia   Diabetes mellitus  History of orthostatic hypotension.  Hyperlipidemia not on medication  Hx BPH    Plan:   Admit to hospital for observation.  IV hydration  Repeat ABG.  Pulmonary consult (no history of COPD obstructive sleep apnea).  Continue hydration monitor renal function and electrolytes.  Anemia W/u , stool for Hemoccult iron studies follow H&H  Resume metformin at lower dose.  Sliding scale coverage.  Symptomatic treatment  Activity as tolerated  DVT prophylaxis  Discharge planning when stable  Labs and data reviewed.  Discussed with patient and nurse  Discussed with ER physician    Chief Complaint:   Dizziness/near syncope, CO2 retention  History of Presenting Illness:   [name] is a 88 y.o. male   with history of diabetes hypertension hyperlipidemia BPH who only takes medicine for diabetes presented to emergency room with dizziness , patient stated that he was walking around his neighborhood when he felt dizzy feeling of as he will collapse had generalized weakness he felt thirsty admits not drinking enough fluids patient also had some lightheadedness no focal neurological symptoms no chest pain palpitation loss of consciousness denies nausea vomiting abdominal pain.  He drank a liter of water then he called EMS and was brought to the emergency room in ER patient was found with stable hemodynamics.  He had elevated CO2 H&H 10.4/31.0 platelet count 210 serum creatinine was 1.6  Blood sugar elevated, anion gap was 4.  Head CT negative for acute process.  Patient is admitted to hospital for further evaluation.  He has been overnight hydrated started feeling better already serum creatinine also improved.    Past Medical History:     Past Medical History:   Diagnosis Date    Diabetes  mellitus     No diagnosis        Past Surgical History:     Past Surgical History:   Procedure Laterality Date    APPENDECTOMY (OPEN)      EGD, BIOPSY N/A 06/30/2015    Procedure: EGD, BIOPSY;  Surgeon: [name], MD;  Location: [facility];  Service: Gastroenterology;  Laterality: N/A;    EGD, COLONOSCOPY N/A 08/04/2015    Procedure: EGD, COLONOSCOPY;  Surgeon: [name], MD;  Location: [facility];  Service: Gastroenterology;  Laterality: N/A;       Family History:   History reviewed. No pertinent family history.    Social History:     Social History     Socioeconomic History    Marital status: Single     Spouse name: Not on file    Number of children: Not on file    Years of education: Not on file    Highest education level: Not on file   Occupational History    Not on file   Tobacco Use    Smoking status: Some Days     Packs/day: 0.50     Types: Cigarettes    Smokeless tobacco: Current    Tobacco comments:     smokes 2-3 cigaretes wekly   Vaping Use    Vaping status: Never Used   Substance and Sexual Activity    Alcohol use: Yes     Comment: occassionally  Drug use: No    Sexual activity: Not on file   Other Topics Concern    Not on file   Social History Narrative    Not on file     Social Determinants of Health     Financial Resource Strain: Not on file   Food Insecurity: Not on file   Transportation Needs: Not on file   Physical Activity: Not on file   Stress: Not on file   Social Connections: Not on file   Intimate Partner Violence: Not on file   Housing Stability: Not on file       Allergies:   No Known Allergies    Medications:     Medications Prior to Admission   Medication Sig Dispense Refill Last Dose    metFORMIN (GLUCOPHAGE) 1000 MG tablet Take 1 tablet (1,000 mg total) by mouth every morning with breakfast.   02/02/2022    acetaminophen (TYLENOL) 325 MG tablet Take 2 tablets (650 mg total) by mouth every 4 (four) hours as needed for Pain.  0     meclizine (ANTIVERT) 12.5 MG tablet Take 1 tablet  (12.5 mg total) by mouth 3 (three) times daily as needed for Dizziness For head Spinning 15 tablet 0                                               Review of Systems:   General: No wt loss , wt gain, distress,  No fever , chills, heat col intolerance,  HEENT:No headache, rhinorrhea, lacrimation,diplopia , photophobia. Sinus pain , .  Pulmonary: No cough, sputum, pleuritic chest pain, SOB,Dyspnea.  Cardiac: No chest pain, palpitation,DOE, LOC, had complain of dizziness near syncope.  GI: no nausea , vomiting , diarrhea, melena ,BRBPR, Weight loss.  Urogenital: No pain, dysuria, burning, Hematuria.  Neurology: No focal weakness, ataxia,gait abnormality, falls, ataxia.  Numbness tingling.    Physical Exam:     Vitals:    02/03/22 0325   BP: 153/58   Pulse:    Resp:    Temp: 97.7 F (36.5 C)   SpO2:        Temp (24hrs), Avg:97.5 F (36.4 C), Min:97.3 F (36.3 C), Max:97.7 F (36.5 C)      General Appearance: No acute distress.  Head:  Normocephalic  Eyes:  PERLA, EOM's intact  Neck:  Supple, No JVD  Lungs:  Clear to auscultation bilaterally, no wheezes, rhonchi or rales.  Heart:  S1, S2 normal, no murmurs.  Abdomen:  Soft, non-tender, non-distended, positive bowel sounds.  Extremities:  No cyanosis, clubbing or edema.  Neurologic:  Alert and oriented x3, mood and affect normal.   Cranial Nerves II-XII grossly intact.  No gross focal deficit.    Labs:.      Results       Procedure Component Value Units Date/Time    Basic Metabolic Panel [[id number]]  (Abnormal) Collected: 02/03/22 0331    Specimen: Blood Updated: 02/03/22 0412     Glucose 206 mg/dL      BUN 14.7 mg/dL      Creatinine 1.2 mg/dL      Calcium 8.0 mg/dL      Sodium 829 mEq/L      Potassium 4.3 mEq/L      Chloride 110 mEq/L      CO2 24 mEq/L      Anion Gap 4.0  GFR [[id number]] Collected: 02/03/22 0331     Updated: 02/03/22 0412     EGFR >60.0    CBC without differential [[id number]]  (Abnormal) Collected: 02/03/22 0331    Specimen: Blood Updated: 02/03/22  0349     WBC 6.62 x10 3/uL      Hgb 10.4 g/dL      Hematocrit 41.3 %      Platelets 210 x10 3/uL      RBC 3.28 x10 6/uL      MCV 94.5 fL      MCH 31.7 pg      MCHC 33.5 g/dL      RDW 13 %      MPV 9.6 fL      Nucleated RBC 0.0 /100 WBC      Absolute NRBC 0.00 x10 3/uL     Glucose Whole Blood - POCT [[id number]]  (Abnormal) Collected: 02/03/22 0105     Updated: 02/03/22 0111     Whole Blood Glucose POCT 114 mg/dL     ZDGUY-40 (SARS-CoV-2) only ([facility]) asymptomatic admission [[id number]] Collected: 02/02/22 2258    Specimen: Nasopharyngeal Updated: 02/02/22 2327     Purpose of COVID testing Screening     SARS-CoV-2 Specimen Source Nasal Swab     SARS CoV 2 Overall Result Not Detected    Narrative:      o Collect and clearly label specimen type:  o PREFERRED-Upper respiratory specimen: One Nasal Swab in  Transport Media.  o Hand deliver to laboratory ASAP  Indication for testing->Extended care facility admission to  semi private room  Screening    Ammonia [[id number]] Collected: 02/02/22 2151    Specimen: Blood Updated: 02/02/22 2212     Ammonia 29 umol/L     Venous Blood Gas [[id number]] Collected: 02/02/22 2151    Specimen: Blood Updated: 02/02/22 2208     pH, Ven 7.273     pCO2, Venous 54.7 mmhg      pO2, Venous 94.9 mmhg      HCO3, Ven 24.5 mEq/L      Venous Total CO2 51.9 mEq/L      Base Excess, Ven -2.3 mEq/L      O2 Sat, Venous 92.4 %      Temperature 37.0     VBG CollectionSite Left AC    TSH [[id number]] Collected: 02/02/22 2004    Specimen: Blood Updated: 02/02/22 2147     TSH 1.18 uIU/mL     High Sensitivity Troponin-I [[id number]] Collected: 02/02/22 2004    Specimen: Blood Updated: 02/02/22 2029     hs Troponin-I <2.7 ng/L     Comprehensive metabolic panel [[id number]]  (Abnormal) Collected: 02/02/22 2004    Specimen: Blood Updated: 02/02/22 2024     Glucose 266 mg/dL      BUN 23.7 mg/dL      Creatinine 1.6 mg/dL      Sodium 628 mEq/L      Potassium 5.1 mEq/L      Chloride 102 mEq/L      CO2 24 mEq/L       Calcium 8.1 mg/dL      Protein, Total 5.9 g/dL      Albumin 3.0 g/dL      AST (SGOT) 13 U/L      ALT 11 U/L      Alkaline Phosphatase 84 U/L      Bilirubin, Total 0.3 mg/dL      Globulin 2.9 g/dL      Albumin/Globulin Ratio 1.0  Anion Gap 6.0    GFR [[id number]] Collected: 02/02/22 2004     Updated: 02/02/22 2024     EGFR 49.6    CBC and differential [[id number]]  (Abnormal) Collected: 02/02/22 2004    Specimen: Blood Updated: 02/02/22 2010     WBC 7.23 x10 3/uL      Hgb 9.9 g/dL      Hematocrit 78.2 %      Platelets 217 x10 3/uL      RBC 3.19 x10 6/uL      MCV 94.4 fL      MCH 31.0 pg      MCHC 32.9 g/dL      RDW 13 %      MPV 10.1 fL      Instrument Absolute Neutrophil Count 5.19 x10 3/uL      Neutrophils 71.8 %      Lymphocytes Automated 18.3 %      Monocytes 6.2 %      Eosinophils Automated 3.2 %      Basophils Automated 0.4 %      Immature Granulocytes 0.1 %      Nucleated RBC 0.0 /100 WBC      Neutrophils Absolute 5.19 x10 3/uL      Lymphocytes Absolute Automated 1.32 x10 3/uL      Monocytes Absolute Automated 0.45 x10 3/uL      Eosinophils Absolute Automated 0.23 x10 3/uL      Basophils Absolute Automated 0.03 x10 3/uL      Immature Granulocytes Absolute 0.01 x10 3/uL      Absolute NRBC 0.00 x10 3/uL     Glucose Whole Blood - POCT [[id number]]  (Abnormal) Collected: 02/02/22 1955     Updated: 02/02/22 1957     Whole Blood Glucose POCT 256 mg/dL             Rads:     Radiology Results (24 Hour)       Procedure Component Value Units Date/Time    CT Head WO Contrast [[id number]] Collected: 02/02/22 2228    Order Status: Completed Updated: 02/02/22 2232    Narrative:      Clinical History:  Altered mental status.    Examination:  CT HEAD WO CONTRAST    TECHNIQUE:  5 mm helical images obtained from the skull base through the vertex  without contrast. 3 mm sagittal and coronal reformatted images provided.   CT images were acquired using Automated Exposure Control for dose  reduction.     COMPARISON:    06/06/2021    FINDINGS:     There is no evidence of acute intracranial hemorrhage, extra-axial  collection, mass effect, midline shift, herniation or hydrocephalus. The  ventricles, sulci and cisterns are age appropriate.  The gray-white  differentiation is intact.   The visualized paranasal sinuses and  mastoid air cells are clear.   There is an old fracture of the left  medial orbital wall. Scattered foci of soft tissue gas are present  inferiorly to the skull base, possibly venous.      Impression:          No acute intracranial abnormality detected.    Scattered foci of soft tissue gas are present inferiorly to the skull  base, possibly venous.    Carleene Overlie, MD  02/02/2022 10:30 PM    Chest AP Portable [[id number]] Collected: 02/02/22 2144    Order Status: Completed Updated: 02/02/22 2148    Narrative:      XR CHEST AP PORTABLE  HISTORY: Chest pain.    COMPARISON: Multiple chest radiographs, most recent 08/07/2021.    TECHNIQUE: A single AP portable radiograph of the chest was obtained.      FINDINGS:    Heart/mediastinum: Normal size and configuration.    Lungs/pleura: No focal airspace disease. Coarsened interstitium. No  pleural fluid or pneumothorax.    Upper abdomen: Unremarkable.    MSK: Polyarticular degenerative changes. Chronic mild deformity of the  posterior left fourth rib. No acute findings.        Impression:        No evidence of acute cardiopulmonary abnormality.    Christy Gentles MD, MD  02/02/2022 9:46 PM              EKG           Signed by: Sharlette Dense, MD, MD    *This note was generated by the Epic EMR system/ Dragon speech recognition and may contain inherent errors or omissions not intended by the user. Grammatical errors, random word insertions, deletions, pronoun errors and incomplete sentences are occasional consequences of this technology due to software limitations. Not all errors are caught or corrected. If there are questions or concerns about the content of this note or  information contained within the body of this dictation they should be addressed directly with the author for clarification

## 2022-02-03 NOTE — Progress Notes (Signed)
Pt arrived to room 2805 via stretcher from ED, ambulated freely to bed. VS as charted. Oriented to unit, call bell explained and safety measures implemented.           FOUR EYES SKIN ASSESSMENT NOTE    [name]  1931/11/19  [id number]         POC Initiated for Risk for Altered Skin No      Patient Assessed for Correct Mattress Surface Yes  *At risk patients with Braden Score less than 12 must be considered for specialty bed    Mepilex or Adhesive Foam Dressing applied to sacrum/heel if any PI risk factors present: No      If Wound/Pressure Injury present:    Wound/PI assessment documented in EHR: No      Admitting physician notified: Yes    Wound consult ordered: No      Earlie Raveling, RN  February 03, 2022  1:43 AM    Second RN/PCT Name: Heber-Overgaard CT

## 2022-02-03 NOTE — OT Eval Note (Signed)
Occupational Therapy Eval and Treatment [name]        Post Acute Care Therapy Recommendations   Discharge Recommendations:  Home with no needs    If recommended discharge disposition is not available, patient will require the following assistance: outpatient PT if symptoms do not resolve    DME needs IF patient is discharging home: Shower chair    Therapy discharge recommendations may change with patient status.  Please refer to most recent note for up-to-date recommendations.    Is PT evaluation indicated at this time? No, an acute care PT evaluation is not required at this time.    Unit: [facility] INTERMED[facility]ATE C[facility]  Bed: A2805/A2805-A      ___________________________________________________    Time of Evaluation and Treatment:  Time Calculation  OT Received On: 02/03/22  Start Time: 1020  Stop Time: 1052  Time Calculation (min): 32 min    Chart Review and Collaboration with Care Team: 3 minutes, not included in above time.    Evaluation: 12 minutes  Treatment:  20 minutes    OT Visit Number: 1    Consult received for [name] for OT Evaluation and Treatment.  Patient's medical condition is appropriate for Occupational therapy intervention at this time.    Activity Orders:  OT eval and treat and progressive mobility protocol    Precautions and Contraindications:  Precautions  Weight Bearing Status: no restrictions  Fall Risks: Medium, Other (Comment) (onset dizziness)    Personal Protective Equipment (PPE)  gloves and procedure mask    Medical Diagnosis:  CO2 retention [E87.29]    History of Present Illness:  [name] is a 88 y.o. male admitted on 02/02/2022 with near syncope, generalized weakness.  Stated that he felt thirsty earlier today, and felt lightheaded while getting off a bus.  Drank a liter of water.  Patient was found to have a blood glucose of 339 by EMS.     Patient Active Problem List   Diagnosis    Lacunar infarction    Lightheadedness    Gastrointestinal hemorrhage  with melena    Symptomatic anemia    Type 2 diabetes mellitus without complication    Mixed hyperlipidemia    Acute blood loss anemia    Syncope    Other specified hypotension    Syncope and collapse    CO2 retention        Past Medical/Surgical History:  Past Medical History:   Diagnosis Date    Diabetes mellitus     No diagnosis      Past Surgical History:   Procedure Laterality Date    APPENDECTOMY (OPEN)      EGD, BIOPSY N/A 06/30/2015    Procedure: EGD, BIOPSY;  Surgeon: Terrilee CroakKazemi, Ali F, MD;  Location: [facility];  Service: Gastroenterology;  Laterality: N/A;    EGD, COLONOSCOPY N/A 08/04/2015    Procedure: EGD, COLONOSCOPY;  Surgeon: Aquilla Solianaher, Ramsey J, MD;  Location: [facility];  Service: Gastroenterology;  Laterality: N/A;        X-Rays/Tests/Labs  Lab Results   Component Value Date/Time    HGB 10.4 (L) 02/03/2022 03:31 AM    HCT 31.0 (L) 02/03/2022 03:31 AM    HCT 25.2 08/05/2015 11:39 AM    K 4.3 02/03/2022 03:31 AM    NA 138 02/03/2022 03:31 AM    TROPI <2.7 02/02/2022 08:04 PM    TROPI <0.01 08/07/2021 07:50 PM    TROPI <0.01 05/20/2021 04:27 AM    TROPI <0.01 05/20/2021  01:23 AM    TROPI <0.01 05/19/2021 07:41 PM       All imaging reviewed. Please see chart for details      Social History:  Prior Level of Function  Prior level of function: Independent with ADLs, Ambulates independently  Baseline Activity Level: Community ambulation  Driving: independent  Dressing - Upper Body: independent  Dressing - Lower Body: independent  Cooking: Yes  Feeding: independent  Bathing: independent  Grooming: independent  Toileting: independent  DME Currently at Home: Other (Comment) (none)    Home Living Arrangements  Living Arrangements: Alone  Type of Home: Apartment  Home Layout: One level, Stairs to enter with rails (add number in comment) (3STE)  Bathroom Shower/Tub: Chief of Staff: Other (Comment) (none)  DME Currently at Home: Other (Comment) (none)  Home Living - Notes /  Comments: resides alone; has family who can check up on pt and visit as necessary      Subjective:      Patient is agreeable to participation in the therapy session. Nursing clears patient for therapy.      Objective:      Observation of Patient/Vital Signs: stable    Cognitive Status and Neuro Exam:  Cognition/Neuro Status  Arousal/Alertness: Appropriate responses to stimuli  Attention Span: Appears intact  Orientation Level: Oriented X4  Memory: Appears intact  Following Commands: independent  Safety Awareness: independent  Insights: Educated in safety awareness  Problem Solving: Able to problem solve independently  Behavior: cooperative, calm, attentive  Motor Planning: intact  Coordination: intact    Musculoskeletal Examination  Gross ROM  Right Upper Extremity ROM: within functional limits  Left Upper Extremity ROM: within functional limits  Gross Strength  Right Upper Extremity Strength: within functional limits  Left Upper Extremity Strength: within functional limits     Vision - Complex Assessment  Tracking: Decreased smoothness of horizontal tracking, Decreased smoothness of vertical tracking  Additional Comments: R eye blind per pt    Activities of Daily Living  Self-care and Home Management  Grooming: Supervision, standing at sink, wash/dry hands  LB Dressing: Stand by Assist, Pull up over hips, Supervision, edge of bed, Don/doff L sock, Don/doff R sock  Toileting: Stand by Assist, standing, perineal hygiene    Functional Mobility:  Mobility and Transfers  Scooting to EOB: Supervision  Supine to Sit: Supervision  Sit to Supine: Supervision  Sit to Stand: Stand by Assist  Functional Mobility/Ambulation: Stand by Assist (no AD)     Balance  Balance  Static Sitting Balance: Supervision  Dyanamic Sitting Balance: Supervision  Static Standing Balance: Stand by Assist  Dynamic Standing Balance: Stand by Assist    Participation and Activity Tolerance  Participation and Endurance  Participation Effort:  excellent  Endurance: Tolerates 10 - 20 min exercise with multiple rests    Educated the Patient to role of occupational therapy, plan of care, goals of therapy and safety with mobility and ADLs, discharge instructions, home safety with verbalized understanding .    Patient left in bed with alarm and all other medical equipment in place and call bell and all personal items/needs within reach.  RN notified of session outcome.       Assessment:  [name] is a 88 y.o. male admitted 02/02/2022. OT Assessment: balance deficits;visual perceptual deficits      Treatment:  -Pt onset dizziness increased by looking up & standing for longer periods of time - educated pt on home safety in  terms of having chair readily available if symptoms arise - instructed on getting shower chair for increased bathroom safety  -Pt agreeable to have family check in throughout day    Rehabilitation Potential: Prognosis: Good    Plan:  OT Frequency Recommended: one time visit - therapy discontinued   Treatment Interventions: Equipment eval/education, Compensatory technique education     PMP Activity: Step 6 - Walks in Room  Distance Walked (ft) (Step 6,7): 25 Feet (FWW)    Risks/benefits/POC discussed    Goals:  Time For Goal Achievement: by time of discharge  ADL Goals  Patient will dress lower body: Stand by Assist, Goal met  Patient will toilet: Stand by Assist, Goal met  Mobility and Transfer Goals  Pt will transfer bed to toilet: Stand by Assist, Goal met                           Shirleen Schirmer, OTR/L  [phone]  Physical Medicine and Rehabilitation  [facility]    02/03/2022  11:00 AM    [facility]  Patient: [name] MRN#: [id number]   Unit: [facility] INTERMEDIATE CARE Bed: A2805/A2805-A

## 2022-02-03 NOTE — Plan of Care (Signed)
Nursing Shift Report     Patient: Kin Galbraith MRN: [id number]  Day: 1        Vss. Afebrile. Denies pain. Safety protocols followed. Purposeful rounding completed.   Neuro: A/ox4. Appropriate mood and affect.  Cardiac: NSR on telemetry. Denies chest pain.  Respiratory: RA. clear LS. Even and unlabored respirations.  GI: Active bowel sounds present. Consistent carb/cardiac diet. No N/V/D. Nontender abd. No BM this shift.  GU: Yellow/straw output without odor.  Skin: Clean and intact.    Care Plan              Problem: Moderate/High Fall Risk Score >5  Goal: Patient will remain free of falls  Outcome: Progressing  Flowsheets (Taken 02/03/2022 0300)  High (Greater than 13):   HIGH-Visual cue at entrance to patient's room   HIGH-Bed alarm on at all times while patient in bed   HIGH-Initiate use of floor mats as appropriate   HIGH-Consider use of low bed     Problem: Safety  Goal: Patient will be free from injury during hospitalization  Outcome: Progressing  Flowsheets (Taken 02/03/2022 0345)  Patient will be free from injury during hospitalization:   Assess patient's risk for falls and implement fall prevention plan of care per policy   Provide and maintain safe environment   Use appropriate transfer methods   Ensure appropriate safety devices are available at the bedside   Include patient/ family/ care giver in decisions related to safety   Hourly rounding   Assess for patients risk for elopement and implement Elopement Risk Plan per policy   Provide alternative method of communication if needed (communication boards, writing)     Problem: Inadequate Gas Exchange  Goal: Adequate oxygenation and improved ventilation  02/03/2022 0345 by Earlie Raveling, RN  Outcome: Progressing  02/03/2022 0345 by Earlie Raveling, RN  Flowsheets (Taken 02/03/2022 0345)  Adequate oxygenation and improved ventilation:   Assess lung sounds   Monitor SpO2 and treat as needed   Provide mechanical and oxygen support to facilitate gas exchange    Position for maximum ventilatory efficiency   Teach/reinforce use of incentive spirometer 10 times per hour while awake, cough and deep breath as needed   Plan activities to conserve energy: plan rest periods   Increase activity as tolerated/progressive mobility

## 2022-02-03 NOTE — Progress Notes (Signed)
02/03/22 1127   Case Management Quick Doc   Date of 1st IMM letter 02/03/22     Patient [name]   opt out of signing letter. CMA left a copy of the letter with patient.

## 2022-02-03 NOTE — Consults (Signed)
Pulmonary Critical Care CONSULTATION    Date Time: 02/03/22 12:15 PM  Patient Name: [name]  Requesting Physician: Sharlette Dense, MD      Assesment:     .  Near syncopal episode  .  Hypercarbia  .  Acute renal failure  .  Hyperlipidemia  .  Diabetes mellitus    Plan:     .  Near syncopal episode, could be related to hypovolemia, continue to monitor hemodynamic status  .  Transient hypercarbia, no history of sleep apnea, patient denies any snoring or daytime fatigue, will repeat blood gas  .  Acute renal failure, improved, continue IV fluids and follow electrolytes  .  Patient is not on any statin, diet controlled  .  Follow blood sugar    Discussed with patient    History:   [name] is a 88 y.o. male with past medical history significant for diabetes mellitus, hyperlipidemia which is diet controlled was admitted to [facility] yesterday afternoon syncopal episode.  According to the patient he felt dizzy and almost passed out when he called the ambulance.  Blood gas done in emergency room showed increased PCO2, I was asked by primary team to see the patient provide further recommendations.  At present he is awake alert and comfortable.     Past Medical History:     Past Medical History:   Diagnosis Date    Diabetes mellitus     No diagnosis        Past Surgical History:     Past Surgical History:   Procedure Laterality Date    APPENDECTOMY (OPEN)      EGD, BIOPSY N/A 06/30/2015    Procedure: EGD, BIOPSY;  Surgeon: [name], MD;  Location: [facility];  Service: Gastroenterology;  Laterality: N/A;    EGD, COLONOSCOPY N/A 08/04/2015    Procedure: EGD, COLONOSCOPY;  Surgeon: [name], MD;  Location: [facility];  Service: Gastroenterology;  Laterality: N/A;       Family History:   History reviewed. No pertinent family history.    Social History:     Social History     Socioeconomic History    Marital status: Single     Spouse name: Not on file    Number of children: Not on file    Years  of education: Not on file    Highest education level: Not on file   Occupational History    Not on file   Tobacco Use    Smoking status: Some Days     Packs/day: 0.50     Types: Cigarettes    Smokeless tobacco: Current    Tobacco comments:     smokes 2-3 cigaretes wekly   Vaping Use    Vaping status: Never Used   Substance and Sexual Activity    Alcohol use: Yes     Comment: occassionally    Drug use: No    Sexual activity: Not on file   Other Topics Concern    Not on file   Social History Narrative    Not on file     Social Determinants of Health     Financial Resource Strain: Not on file   Food Insecurity: Not on file   Transportation Needs: Not on file   Physical Activity: Not on file   Stress: Not on file   Social Connections: Not on file   Intimate Partner Violence: Not on file   Housing Stability: Not on file  Allergies:   No Known Allergies    Hospital Medications:     Current Facility-Administered Medications   Medication Dose Route Frequency    heparin (porcine)  5,000 Units Subcutaneous Q8H SCH    insulin lispro  1-4 Units Subcutaneous QHS    insulin lispro  1-8 Units Subcutaneous TID AC    metFORMIN  500 mg Oral BID Meals       Home Medications:     Medications Prior to Admission   Medication Sig Dispense Refill Last Dose    metFORMIN (GLUCOPHAGE) 1000 MG tablet Take 1 tablet (1,000 mg total) by mouth every morning with breakfast.   02/02/2022    acetaminophen (TYLENOL) 325 MG tablet Take 2 tablets (650 mg total) by mouth every 4 (four) hours as needed for Pain.  0     meclizine (ANTIVERT) 12.5 MG tablet Take 1 tablet (12.5 mg total) by mouth 3 (three) times daily as needed for Dizziness For head Spinning 15 tablet 0        Review of Systems:     General ROS: negative for - chills, fever or night sweats, positive for fatigue  Ophthalmic ROS: negative for - double vision, excessive tearing, itchy eyes or photophobia  ENT ROS: negative for - headaches, nasal congestion, sore throat or  vertigo  Respiratory ROS: negative for - cough, hemoptysis, orthopnea, pleuritic pain, shortness of breath, tachypnea or wheezing  Cardiovascular ROS: negative for - chest pain, edema, orthopnea, rapid heart rate or shortness of breath  Gastrointestinal ROS: negative for - abdominal pain, blood in stools, constipation, diarrhea, heartburn or nausea/vomiting  Musculoskeletal ROS: negative for - muscle pain  Neurological ROS: Positive for dizziness, negative for - confusion, headaches, speech problems, visual changes or weakness  Dermatological ROS: negative for dry skin, pruritus and rash    Physical Exam:   BP 175/75   Pulse 77   Temp 98.3 F (36.8 C) (Oral)   Resp 14   Ht 1.753 m (5\' 9" )   Wt 71.4 kg (157 lb 8.2 oz)   SpO2 99%   BMI 23.26 kg/m     Intake and Output Summary (Last 24 hours) at Date Time    Intake/Output Summary (Last 24 hours) at 02/03/2022 1215  Last data filed at 02/03/2022 0325  Gross per 24 hour   Intake 999 ml   Output 200 ml   Net 799 ml       General appearance - alert,  and in no distress  Mental status - alert, oriented to person, place, and time  Eyes - pupils equal and reactive, extraocular eye movements intact  Ears - no external ear lesions seen  Nose - no nasal discharge   Mouth - clear oral mucosa, moist   Neck - supple, no significant adenopathy  Chest - clear to auscultation, no wheezes, rales or rhonchi, symmetric air entry  Heart - normal rate, regular rhythm, normal S1, S2, no rubs, clicks or gallops  Abdomen - soft, nontender, nondistended, no masses or organomegaly  Neurological - alert, oriented, normal speech, no focal findings or movement disorder noted  Musculoskeletal - no joint swelling or tenderness  Extremities -  no pedal edema, no clubbing or cyanosis  Skin - normal coloration, no rashes    Labs Reviewed:     Results       Procedure Component Value Units Date/Time    Glucose Whole Blood - POCT [[id number]]  (Abnormal) Collected: 02/03/22 1151     Updated: 02/03/22  1157     Whole Blood Glucose POCT 200 mg/dL     Folate [[id number]] Collected: 02/03/22 0331    Specimen: Blood Updated: 02/03/22 1040     Folate 9.6 ng/mL     Vitamin B12 [[id number]] Collected: 02/03/22 0331    Specimen: Blood Updated: 02/03/22 1035     Vitamin B-12 273 pg/mL     Hemolysis index [[id number]] Collected: 02/03/22 0331     Updated: 02/03/22 1019     Hemolysis Index 3 Index     IRON PROFILE [[id number]]  (Abnormal) Collected: 02/03/22 0331     Updated: 02/03/22 1019     Iron 87 ug/dL      UIBC 629 ug/dL      TIBC 528 ug/dL      Iron Saturation 35 %     Blood gas, arterial [[id number]]  (Abnormal) Collected: 02/03/22 0900    Specimen: Blood, Arterial Updated: 02/03/22 0921     pH, Arterial 7.399     pCO2, Arterial 39.8 mmhg      pO2, Arterial 85.5 mmhg      HCO3, Arterial 24.1 mEq/L      Arterial Total CO2 49.5 mEq/L      Base Excess, Arterial -0.1 mEq/L      O2 Sat, Arterial 95.3 %      ABG CollectionSite Right Radl     Allen's Test Yes     Temperature 37.0     FIO2 21 %      Status Room Air     O2 Delivery Room Air    Glucose Whole Blood - POCT [[id number]]  (Abnormal) Collected: 02/03/22 0740     Updated: 02/03/22 0750     Whole Blood Glucose POCT 110 mg/dL     Basic Metabolic Panel [[id number]]  (Abnormal) Collected: 02/03/22 0331    Specimen: Blood Updated: 02/03/22 0412     Glucose 206 mg/dL      BUN 63.8 mg/dL      Creatinine 1.2 mg/dL      Calcium 8.0 mg/dL      Sodium 756 mEq/L      Potassium 4.3 mEq/L      Chloride 110 mEq/L      CO2 24 mEq/L      Anion Gap 4.0    GFR [[id number]] Collected: 02/03/22 0331     Updated: 02/03/22 0412     EGFR >60.0    CBC without differential [[id number]]  (Abnormal) Collected: 02/03/22 0331    Specimen: Blood Updated: 02/03/22 0349     WBC 6.62 x10 3/uL      Hgb 10.4 g/dL      Hematocrit 60.1 %      Platelets 210 x10 3/uL      RBC 3.28 x10 6/uL      MCV 94.5 fL      MCH 31.7 pg      MCHC 33.5 g/dL      RDW 13 %      MPV 9.6 fL      Nucleated RBC 0.0 /100 WBC       Absolute NRBC 0.00 x10 3/uL     Glucose Whole Blood - POCT [[id number]]  (Abnormal) Collected: 02/03/22 0105     Updated: 02/03/22 0111     Whole Blood Glucose POCT 114 mg/dL     UKGUR-42 (SARS-CoV-2) only ([facility]) asymptomatic admission [[id number]] Collected: 02/02/22 2258    Specimen: Nasopharyngeal Updated: 02/02/22 2327     Purpose of COVID testing Screening  SARS-CoV-2 Specimen Source Nasal Swab     SARS CoV 2 Overall Result Not Detected    Narrative:      o Collect and clearly label specimen type:  o PREFERRED-Upper respiratory specimen: One Nasal Swab in  Transport Media.  o Hand deliver to laboratory ASAP  Indication for testing->Extended care facility admission to  semi private room  Screening    Ammonia [[id number]] Collected: 02/02/22 2151    Specimen: Blood Updated: 02/02/22 2212     Ammonia 29 umol/L     Venous Blood Gas [[id number]] Collected: 02/02/22 2151    Specimen: Blood Updated: 02/02/22 2208     pH, Ven 7.273     pCO2, Venous 54.7 mmhg      pO2, Venous 94.9 mmhg      HCO3, Ven 24.5 mEq/L      Venous Total CO2 51.9 mEq/L      Base Excess, Ven -2.3 mEq/L      O2 Sat, Venous 92.4 %      Temperature 37.0     VBG CollectionSite Left AC    TSH [[id number]] Collected: 02/02/22 2004    Specimen: Blood Updated: 02/02/22 2147     TSH 1.18 uIU/mL     High Sensitivity Troponin-I [[id number]] Collected: 02/02/22 2004    Specimen: Blood Updated: 02/02/22 2029     hs Troponin-I <2.7 ng/L     Comprehensive metabolic panel [[id number]]  (Abnormal) Collected: 02/02/22 2004    Specimen: Blood Updated: 02/02/22 2024     Glucose 266 mg/dL      BUN 15.4 mg/dL      Creatinine 1.6 mg/dL      Sodium 008 mEq/L      Potassium 5.1 mEq/L      Chloride 102 mEq/L      CO2 24 mEq/L      Calcium 8.1 mg/dL      Protein, Total 5.9 g/dL      Albumin 3.0 g/dL      AST (SGOT) 13 U/L      ALT 11 U/L      Alkaline Phosphatase 84 U/L      Bilirubin, Total 0.3 mg/dL      Globulin 2.9 g/dL      Albumin/Globulin Ratio 1.0     Anion Gap  6.0    GFR [[id number]] Collected: 02/02/22 2004     Updated: 02/02/22 2024     EGFR 49.6    CBC and differential [[id number]]  (Abnormal) Collected: 02/02/22 2004    Specimen: Blood Updated: 02/02/22 2010     WBC 7.23 x10 3/uL      Hgb 9.9 g/dL      Hematocrit 99.8 %      Platelets 217 x10 3/uL      RBC 3.19 x10 6/uL      MCV 94.4 fL      MCH 31.0 pg      MCHC 32.9 g/dL      RDW 13 %      MPV 10.1 fL      Instrument Absolute Neutrophil Count 5.19 x10 3/uL      Neutrophils 71.8 %      Lymphocytes Automated 18.3 %      Monocytes 6.2 %      Eosinophils Automated 3.2 %      Basophils Automated 0.4 %      Immature Granulocytes 0.1 %      Nucleated RBC 0.0 /100 WBC      Neutrophils Absolute 5.19  x10 3/uL      Lymphocytes Absolute Automated 1.32 x10 3/uL      Monocytes Absolute Automated 0.45 x10 3/uL      Eosinophils Absolute Automated 0.23 x10 3/uL      Basophils Absolute Automated 0.03 x10 3/uL      Immature Granulocytes Absolute 0.01 x10 3/uL      Absolute NRBC 0.00 x10 3/uL     Glucose Whole Blood - POCT [[id number]]  (Abnormal) Collected: 02/02/22 1955     Updated: 02/02/22 1957     Whole Blood Glucose POCT 256 mg/dL             Rads:   Radiological Procedure reviewed.   Radiology Results (24 Hour)       Procedure Component Value Units Date/Time    CT Head WO Contrast [[id number]] Collected: 02/02/22 2228    Order Status: Completed Updated: 02/02/22 2232    Narrative:      Clinical History:  Altered mental status.    Examination:  CT HEAD WO CONTRAST    TECHNIQUE:  5 mm helical images obtained from the skull base through the vertex  without contrast. 3 mm sagittal and coronal reformatted images provided.   CT images were acquired using Automated Exposure Control for dose  reduction.     COMPARISON:   06/06/2021    FINDINGS:     There is no evidence of acute intracranial hemorrhage, extra-axial  collection, mass effect, midline shift, herniation or hydrocephalus. The  ventricles, sulci and cisterns are age appropriate.   The gray-white  differentiation is intact.   The visualized paranasal sinuses and  mastoid air cells are clear.   There is an old fracture of the left  medial orbital wall. Scattered foci of soft tissue gas are present  inferiorly to the skull base, possibly venous.      Impression:          No acute intracranial abnormality detected.    Scattered foci of soft tissue gas are present inferiorly to the skull  base, possibly venous.    Carleene Overlie, MD  02/02/2022 10:30 PM    Chest AP Portable [[id number]] Collected: 02/02/22 2144    Order Status: Completed Updated: 02/02/22 2148    Narrative:      XR CHEST AP PORTABLE    HISTORY: Chest pain.    COMPARISON: Multiple chest radiographs, most recent 08/07/2021.    TECHNIQUE: A single AP portable radiograph of the chest was obtained.      FINDINGS:    Heart/mediastinum: Normal size and configuration.    Lungs/pleura: No focal airspace disease. Coarsened interstitium. No  pleural fluid or pneumothorax.    Upper abdomen: Unremarkable.    MSK: Polyarticular degenerative changes. Chronic mild deformity of the  posterior left fourth rib. No acute findings.        Impression:        No evidence of acute cardiopulmonary abnormality.    Christy Gentles MD, MD  02/02/2022 9:46 PM            Signed by: Lattie Haw, MD

## 2022-02-03 NOTE — UM Notes (Addendum)
[facility]   NPI #[id number], Tax ID [id number]  Please call Fuller PlanFrances Griffyn Kucinski MSN RN CCM @  [phone]  with any questions or concerns.  Email:  [email or web address]  Fax final authorization and requests for additional information to [phone]    ER ADMIT DATE AND TIME: 02/02/2022  7:37 PM  IP ADMIT DATE:02/02/22 2303  DATE/TIME OF OBSERVATION ORDER: 02/03/22 1436  Diagnosis: Dizziness   Level of Care: Acute       Review for 4/4-4/5    PATIENT NAME: [name],[name] / [birth year] / AGE: 88 y.o.      History of present illness: Pt is a 88 y.o. male who arrived to [facility] (02/02/2022 at 1937) with C/ONear syncope and Dizziness  The pt has a history of diabetes hypertension hyperlipidemia BPH who only takes medicine for diabetes presented to emergency room with dizziness , patient stated that he was walking around his neighborhood when he felt dizzy feeling of as he will collapse had generalized weakness he felt thirsty admits not drinking enough fluids patient also had some lightheadedness.      PMH:  has a past medical history of Diabetes mellitus and No diagnosis.        V/S:  Vital Sign Min/Max (last 24 hours)    Value Min Max   Temp 97.3 F (36.3 C) 98.3 F (36.8 C)   Heart Rate 63 77   Resp Rate 12 22   BP: Systolic 104 190   BP: Diastolic 51 84   SpO2 96 % 100 %       Labs -   Latest Reference Range & Units 02/02/22 20:04 02/03/22 03:31   Hemoglobin 12.5 - 17.1 g/dL 9.9 (L) 93.210.4 (L)   Hematocrit 37.6 - 49.6 % 30.1 (L) 31.0 (L)   RBC 4.20 - 5.90 x10 6/uL 3.19 (L) 3.28 (L)      Latest Reference Range & Units 02/02/22 20:04 02/03/22 03:31   Glucose 70 - 100 mg/dL 355266 (H) 732206 (H)   Creatinine 0.5 - 1.5 mg/dL 1.6 (H) 1.2   Sodium 202135 - 145 mEq/L 132 (L) 138      Latest Reference Range & Units 02/03/22 09:00   pH, Arterial 7.350 - 7.450  7.399   pCO2, Arterial 35.0 - 45.0 mmhg 39.8   pO2, Arterial 80.0 - 90.0 mmhg 85.5   HCO3, Arterial 23.0 - 29.0 mEq/L 24.1       Radiologic Studies -    CT Head WO Contrast    Result Date: 02/02/2022  No acute intracranial abnormality detected. Scattered foci of soft tissue gas are present inferiorly to the skull base, possibly venous. Carleene OverlieArmando Garza, MD 02/02/2022 10:30 PM    Chest AP Portable    Result Date: 02/02/2022  No evidence of acute cardiopulmonary abnormality. Christy GentlesJames Evan Tyler MD, MD 02/02/2022 9:46 PM            ED meds:    Date/Time Order Dose Route Action    02/02/2022 2158 EDT sodium chloride 0.9 % bolus 1,000 mL 1,000 mL Intravenous New Bag    02/02/2022 2316 EDT 0.9% NaCl infusion -- Intravenous New Bag       MD NOTES:  H&P  Assessment:   Dizziness   CO2   Acute kidney injury / Dehydration  Anemia   Diabetes mellitus  History of orthostatic hypotension.  Hyperlipidemia not on medication  Hx BPH  Plan:   Admit to hospital for observation.  IV hydration  Repeat ABG.  Pulmonary  consult (no history of COPD obstructive sleep apnea).  Continue hydration monitor renal function and electrolytes.  Anemia W/u , stool for Hemoccult iron studies follow H&H  Resume metformin at lower dose.  Sliding scale coverage.  Symptomatic treatment  Activity as tolerated  DVT prophylaxis  Discharge planning when stable    I/S:FALL PRECAUTIONS; PULSE OXIMETRY; NEURO CHECKS; SCDs     Pulmonary Critical Care CONSULTATION  Assesment:      . Near syncopal episode  .  Hypercarbia  .  Acute renal failure  .  Hyperlipidemia  .  Diabetes mellitus  Plan:      .  Near syncopal episode, could be related to hypovolemia, continue to monitor hemodynamic status  .  Transient hypercarbia, no history of sleep apnea, patient denies any snoring or daytime fatigue, will repeat blood gas  .  Acute renal failure, improved, continue IV fluids and follow electrolytes  .  Patient is not on any statin, diet controlled  .  Follow blood sugar      Current Medications:   Scheduled Meds:  Current Facility-Administered Medications   Medication Dose Route Frequency    heparin (porcine)  5,000 Units Subcutaneous  Q8H SCH    insulin lispro  1-4 Units Subcutaneous QHS    insulin lispro  1-8 Units Subcutaneous TID AC    metFORMIN  500 mg Oral BID Meals

## 2022-02-03 NOTE — Plan of Care (Signed)
Notes: Patient is A&O x4 . Patient reported feeling mild dizziness with a mild head ache. Blood pressure was elevated in the morning, doctor notified. Other vital signs are within normal ranges. No other significant changes during shift.   Problem: Moderate/High Fall Risk Score >5  Goal: Patient will remain free of falls  Outcome: Progressing  Flowsheets (Taken 02/03/2022 1342)  Moderate Risk (6-13):   LOW-Fall Interventions Appropriate for Low Fall Risk   LOW-Anticoagulation education for injury risk   MOD-Apply bed exit alarm if patient is confused   MOD-Consider activation of bed alarm if appropriate   MOD-Floor mat at bedside (where available) if appropriate   MOD-Consider a move closer to Nurses Station   MOD-Place bedside commode and assistive devices out of sight when not in use   MOD-Remain with patient during toileting     Problem: Safety  Goal: Patient will be free from injury during hospitalization  Outcome: Progressing  Flowsheets (Taken 02/03/2022 1342)  Patient will be free from injury during hospitalization:   Assess patient's risk for falls and implement fall prevention plan of care per policy   Provide and maintain safe environment   Ensure appropriate safety devices are available at the bedside   Use appropriate transfer methods     Problem: Inadequate Gas Exchange  Goal: Adequate oxygenation and improved ventilation  Outcome: Progressing  Flowsheets (Taken 02/03/2022 1342)  Adequate oxygenation and improved ventilation:   Monitor SpO2 and treat as needed   Teach/reinforce use of incentive spirometer 10 times per hour while awake, cough and deep breath as needed   Provide mechanical and oxygen support to facilitate gas exchange   Assess lung sounds   Monitor and treat ETCO2   Position for maximum ventilatory efficiency

## 2022-02-04 ENCOUNTER — Observation Stay: Payer: Medicare Other

## 2022-02-04 LAB — CBC
Absolute NRBC: 0 10*3/uL (ref 0.00–0.00)
Hematocrit: 32.1 % — ABNORMAL LOW (ref 37.6–49.6)
Hgb: 11 g/dL — ABNORMAL LOW (ref 12.5–17.1)
MCH: 31.2 pg (ref 25.1–33.5)
MCHC: 34.3 g/dL (ref 31.5–35.8)
MCV: 90.9 fL (ref 78.0–96.0)
MPV: 9.9 fL (ref 8.9–12.5)
Nucleated RBC: 0 /100 WBC (ref 0.0–0.0)
Platelets: 214 10*3/uL (ref 142–346)
RBC: 3.53 10*6/uL — ABNORMAL LOW (ref 4.20–5.90)
RDW: 13 % (ref 11–15)
WBC: 7.45 10*3/uL (ref 3.10–9.50)

## 2022-02-04 LAB — GLUCOSE WHOLE BLOOD - POCT
Whole Blood Glucose POCT: 88 mg/dL (ref 70–100)
Whole Blood Glucose POCT: 179 mg/dL — ABNORMAL HIGH (ref 70–100)
Whole Blood Glucose POCT: 159 mg/dL — ABNORMAL HIGH (ref 70–100)
Whole Blood Glucose POCT: 182 mg/dL — ABNORMAL HIGH (ref 70–100)

## 2022-02-04 LAB — BASIC METABOLIC PANEL
Anion Gap: 8 (ref 5.0–15.0)
BUN: 13 mg/dL (ref 9.0–28.0)
CO2: 25 mEq/L (ref 17–29)
Calcium: 8.4 mg/dL (ref 7.9–10.2)
Chloride: 109 mEq/L (ref 99–111)
Creatinine: 1 mg/dL (ref 0.5–1.5)
Glucose: 95 mg/dL (ref 70–100)
Potassium: 4.3 mEq/L (ref 3.5–5.3)
Sodium: 142 mEq/L (ref 135–145)

## 2022-02-04 LAB — GFR: EGFR: >60

## 2022-02-04 MED ORDER — POLYETHYLENE GLYCOL 3350 17 G PO PACK
17.0000 g | PACK | Freq: Every day | ORAL | Status: DC | PRN
Start: 2022-02-04 — End: 2022-02-05
  Administered 2022-02-05: 17 g via ORAL
  Filled 2022-02-04: qty 1

## 2022-02-04 MED ORDER — LOSARTAN POTASSIUM 25 MG PO TABS
25.0000 mg | ORAL_TABLET | Freq: Every day | ORAL | 0 refills | Status: AC
Start: 2022-02-05 — End: ?

## 2022-02-04 MED ORDER — POLYETHYLENE GLYCOL 3350 17 G PO PACK
17.0000 g | PACK | Freq: Once | ORAL | Status: DC | PRN
Start: 2022-02-04 — End: 2022-02-04

## 2022-02-04 MED ORDER — DOCUSATE SODIUM 100 MG PO CAPS
100.0000 mg | ORAL_CAPSULE | Freq: Every day | ORAL | Status: DC
Start: 2022-02-05 — End: 2022-02-05
  Administered 2022-02-05: 100 mg via ORAL
  Filled 2022-02-04: qty 1

## 2022-02-04 NOTE — Plan of Care (Signed)
Notes: patient is alert and oriented x4 with stable vital signs. Patient was not able to tolerate sitting up in bed and standing. Patient complained of dizziness. Laying BP was 154/70, sitting was 161/71, and standing was 137/66. Continuing to monitor.  Problem: Moderate/High Fall Risk Score >5  Goal: Patient will remain free of falls  Outcome: Progressing  Flowsheets (Taken 02/04/2022 1140)  Moderate Risk (6-13):   MOD-Consider activation of bed alarm if appropriate   LOW-Anticoagulation education for injury risk   MOD-Apply bed exit alarm if patient is confused   LOW-Fall Interventions Appropriate for Low Fall Risk   MOD-Floor mat at bedside (where available) if appropriate   MOD-Place bedside commode and assistive devices out of sight when not in use     Problem: Safety  Goal: Patient will be free from injury during hospitalization  Outcome: Progressing  Flowsheets (Taken 02/04/2022 1140)  Patient will be free from injury during hospitalization:   Assess patient's risk for falls and implement fall prevention plan of care per policy   Use appropriate transfer methods   Ensure appropriate safety devices are available at the bedside   Hourly rounding   Include patient/ family/ care giver in decisions related to safety     Problem: Inadequate Gas Exchange  Goal: Adequate oxygenation and improved ventilation  Outcome: Progressing  Flowsheets (Taken 02/04/2022 1140)  Adequate oxygenation and improved ventilation:   Assess lung sounds   Provide mechanical and oxygen support to facilitate gas exchange   Monitor SpO2 and treat as needed   Monitor and treat ETCO2   Position for maximum ventilatory efficiency

## 2022-02-04 NOTE — Plan of Care (Signed)
Neuro: AO x 4  Resp:  Bilateral breath sounds clear; Encouraged IS use and deep breathing exercises. Tolerating RA.  CV: Regular, denies chest pain.  GI:  Active bowel sounds present. Tolerating regular diet. No emesis on this shift, no nausea present - abd soft, nontender.  GU: Voiding freely.  M/Activity: Active - in all extremities; standby assist. Turns and repositions self. No distress noted  Integ: WDL for ethnicity  IV:  IV flushed and saline locked   Pain: no pain meds administered  DVT Prophylaxis: Bilateral SCDs on LEs.  Safety: Moderate Falls Risk. Informed to call for assistance prior to getting up. Bed remains in lowest position and 3/4 side rails up.  Psychosocial: Calm and follows commands.   Shift Events: n/a  Plan of Care: Monitor BP     Pt resting comfortably in bed with no signs of distress or discomfort  Call light & personal belongings within reach, bed alarm on. Floor mat at bedside.     _______________________________________________________________________________  Problem: Moderate/High Fall Risk Score >5  Goal: Patient will remain free of falls  Outcome: Progressing  Flowsheets (Taken 02/03/2022 2000)  High (Greater than 13):   HIGH-Consider use of low bed   HIGH-Initiate use of floor mats as appropriate   HIGH-Visual cue at entrance to patient's room   HIGH-Bed alarm on at all times while patient in bed     Problem: Safety  Goal: Patient will be free from injury during hospitalization  Outcome: Progressing  Flowsheets (Taken 02/04/2022 0044)  Patient will be free from injury during hospitalization:   Assess patient's risk for falls and implement fall prevention plan of care per policy   Use appropriate transfer methods   Include patient/ family/ care giver in decisions related to safety   Hourly rounding   Provide and maintain safe environment   Provide alternative method of communication if needed (communication boards, writing)     Problem: Inadequate Gas Exchange  Goal: Adequate oxygenation  and improved ventilation  Outcome: Progressing  Flowsheets (Taken 02/04/2022 0044)  Adequate oxygenation and improved ventilation:   Assess lung sounds   Monitor SpO2 and treat as needed   Provide mechanical and oxygen support to facilitate gas exchange   Position for maximum ventilatory efficiency   Plan activities to conserve energy: plan rest periods   Increase activity as tolerated/progressive mobility   Consult/collaborate with Respiratory Therapy

## 2022-02-04 NOTE — UM Notes (Signed)
[facility] Utilization Review   NPI #[id number], Tax ID [id number]  Please call Fuller Plan MSN RN CCM @  [phone]  with any questions or concerns.  Email:  [email or web address]  Fax final authorization and requests for additional information to [id number]    CONCURRENT REVIEW FOR: 02/04/22        PATIENT NAME: [name],[name] / Oct 03, 1932 / AGE: 88 y.o.        V/S:  Blood pressure 137/66, pulse 80, temperature 97.6 F (36.4 C), temperature source Axillary, resp. rate 19, height 1.753 m (5\' 9" ), weight 70 kg (154 lb 5.2 oz), SpO2 100 %    Labs -   Latest Reference Range & Units 02/04/22 03:32   Hemoglobin 12.5 - 17.1 g/dL 04/06/22 (L)   Hematocrit 22.9 - 49.6 % 32.1 (L)   RBC 4.20 - 5.90 x10 6/uL 3.53 (L)       MD NOTES:  PULM. CCM PROGRESS NOTE  PLAN   Near syncopal episode, possible vasovagal, seems comfortable now  .  Hypercarbia has resolved  .  Continue metformin, also on sliding scale  .  Renal function has resolved, reviewed electrolytes         PER NEUROLOGY CONSULT NOTES  A&P  Non-specific dizziness - difficult for pt to characterize.  He is feeling better "in the last few hours" but given hx CVA in 2016 with similar symptoms - must consider CVA in the differential.  Orthostatics were negative here in the hospital.      Obtain MRI brain  PT/OT  Further work up depending on MRI findings and symptoms.    Current Medications:    Scheduled Meds:  Current Facility-Administered Medications   Medication Dose Route Frequency    heparin (porcine)  5,000 Units Subcutaneous Q8H SCH    insulin lispro  1-4 Units Subcutaneous QHS    insulin lispro  1-8 Units Subcutaneous TID AC    losartan  25 mg Oral Daily    metFORMIN  500 mg Oral BID Meals

## 2022-02-04 NOTE — Progress Notes (Addendum)
PROGRESS NOTE    Date Time: 02/04/22 6:50 AM  Patient Name: [name], [name]  Patient status: Observation  Hospital Day: 0      Assessment:   Dizziness due to dehydration.  CT scan, MRI negative  CO2 retention.  Acute kidney injury/dehydration  Anemia  Diabetes mellitus  Hypertension  Hx orthostatic hypotension  Hyperlipidemia  Hx BPH    Plan:     Patient again felt dizzy upon standing, orthostasis improved renal functions improved.  Given persistence of dizziness neurology consulted , MRI negative for acute process  Continue hydration.  CO2 retention resolved likely related to near syncopal episode.  Renal functions improved.  H&H improved no obvious bleeding.  Blood sugar controlled on sliding scale coverage, metformin.  BP controlled on losartan.  DVT prophylaxis    Continue rest of treatment.  Discussed plan with patient and nurse  [facility]  Subjective:   Patient seen and examined :  Overall feels better patient felt dizzy with ambulation.  No focal neurological symptoms    Review of Systems:   General ROS: no fever no chills,  CVS ROS: no CP, no palpitation, no orthopnea, no PND  RESP ROS: no SOB, no cough, no sputum  GI ROS: no N/V, no diarrhea, no constipation, no melena, no hematochezia  GU ROS: no dysuria, no frequency, no hematuria, no flank pain  NEURO ROS: no focal weakness, no headache, no paraesthesia  MSK ROS: no limitation of movement, no joint pain, no swelling, no leg deformity    Medications:     Current Facility-Administered Medications   Medication Dose Route Frequency    heparin (porcine)  5,000 Units Subcutaneous Q8H SCH    insulin lispro  1-4 Units Subcutaneous QHS    insulin lispro  1-8 Units Subcutaneous TID AC    losartan  25 mg Oral Daily    metFORMIN  500 mg Oral BID Meals       acetaminophen, dextrose **OR** dextrose **OR** dextrose **OR** glucagon (rDNA), hydrALAZINE, magnesium sulfate, meclizine, melatonin, naloxone, potassium & sodium phosphates, potassium chloride  **AND** potassium chloride    Physical Exam:   BP 139/63   Pulse 79   Temp 97.9 F (36.6 C) (Axillary)   Resp 20   Ht 1.753 m (5\' 9" )   Wt 70 kg (154 lb 5.2 oz)   SpO2 98%   BMI 22.79 kg/m     Intake and Output Summary (Last 24 hours) at Date Time    Intake/Output Summary (Last 24 hours) at 02/04/2022 0650  Last data filed at 02/04/2022 0335  Gross per 24 hour   Intake --   Output 1500 ml   Net -1500 ml       General: awake, alert, oriented x 3; no acute distress.  Neck: Supple no JVD  ENT: PERRLA, EOMI  Lungs: Coarse breath sounds no wheezing  Heart: Regular rate and rhythm no gallop  Abdomen: Soft nontender bowel sounds positive  Extremities: Warm moist  Neuro: Grossly intact  GU: No CVA tenderness.  Gait ; Stable   Labs:     CBC w/Diff CMP   Recent Labs   Lab 02/04/22  0332 02/03/22  0331 02/02/22  2004   WBC 7.45 6.62 7.23   Hgb 11.0* 10.4* 9.9*   Hematocrit 32.1* 31.0* 30.1*   Platelets 214 210 217   MCV 90.9 94.5 94.4   Neutrophils  --   --  71.8       PT/INR  Recent Labs   Lab 02/04/22  0332 02/03/22  0331 02/02/22  2004   Sodium 142 138 132*   Potassium 4.3 4.3 5.1   Chloride 109 110 102   CO2 25 24 24    BUN 13.0 15.0 19.0   Creatinine 1.0 1.2 1.6*   Glucose 95 206* 266*   Calcium 8.4 8.0 8.1   Protein, Total  --   --  5.9*   Albumin  --   --  3.0*   AST (SGOT)  --   --  13   ALT  --   --  11   Alkaline Phosphatase  --   --  84   Bilirubin, Total  --   --  0.3      Glucose POCT   Recent Labs   Lab 02/04/22  0332 02/03/22  0331 02/02/22  2004   Glucose 95 206* 266*        Recent Labs   Lab 02/02/22  2004   hs Troponin-I <2.7       Rads:   No results found.      Sharlette DenseMussarat Bing Duffey, MD  02/04/2022    *This note was generated by the Epic EMR system/ Dragon speech recognition and may contain inherent errors or omissions not intended by the user. Grammatical errors, random word insertions, deletions, pronoun errors and incomplete sentences are occasional consequences of this technology due to software  limitations. Not all errors are caught or corrected. If there are questions or concerns about the content of this note or information contained within the body of this dictation they should be addressed directly with the author for clarification

## 2022-02-04 NOTE — Progress Notes (Signed)
PULM. CCM PROGRESS NOTE    Date Time: 02/04/22 11:41 AM  Patient Name: [name]      Assessment:     .  Near syncopal episode  .  Transient hypercarbia  .  Diabetes mellitus  .  Acute renal failure  .  Hyperlipidemia    Plan:     .  Near syncopal episode, possible vasovagal, seems comfortable now  .  Hypercarbia has resolved  .  Continue metformin, also on sliding scale  .  Renal function has resolved, reviewed electrolytes    Okay to discharge from pulmonary standpoint    Subjective:   Patient is a 88 y.o. male who is awake, alert, and comfortable.     Medications:     Current Facility-Administered Medications   Medication Dose Route Frequency    heparin (porcine)  5,000 Units Subcutaneous Q8H SCH    insulin lispro  1-4 Units Subcutaneous QHS    insulin lispro  1-8 Units Subcutaneous TID AC    losartan  25 mg Oral Daily    metFORMIN  500 mg Oral BID Meals       Review of Systems:     General ROS: negative for - chills, fever or night sweats  Ophthalmic ROS: negative for - double vision, excessive tearing, itchy eyes or photophobia  ENT ROS: negative for - headaches, nasal congestion, sore throat or vertigo  Respiratory ROS: negative for - cough, hemoptysis, orthopnea, pleuritic pain, shortness of breath, tachypnea or wheezing  Cardiovascular ROS: negative for - chest pain, edema, orthopnea, rapid heart rate  Gastrointestinal ROS: negative for - abdominal pain, blood in stools, constipation, diarrhea, heartburn or nausea/vomiting  Musculoskeletal ROS: negative for - muscle pain  Neurological ROS: negative for - confusion, dizziness, headaches, speech problems, visual changes or weakness  Dermatological ROS: negative for dry skin, pruritus and rash    Physical Exam:   BP 137/66   Pulse 80   Temp 97.6 F (36.4 C) (Axillary)   Resp 19   Ht 1.753 m (5\' 9" )   Wt 70 kg (154 lb 5.2 oz)   SpO2 100%   BMI 22.79 kg/m     Intake and Output Summary (Last 24 hours) at Date Time    Intake/Output Summary (Last 24  hours) at 02/04/2022 1141  Last data filed at 02/04/2022 0335  Gross per 24 hour   Intake --   Output 1500 ml   Net -1500 ml       General appearance - alert,  and in no distress  Mental status - alert, oriented to person, place, and time  Eyes - pupils equal and reactive, extraocular eye movements intact  Ears - no external ear lesions seen  Nose - no nasal discharge   Mouth - clear oral mucosa, moist   Neck - supple, no significant adenopathy  Chest - clear to auscultation, no wheezes, rales or rhonchi, symmetric air entry  Heart - normal rate, regular rhythm, normal S1, S2, no murmurs, rubs, clicks or gallops  Abdomen - soft, nontender, nondistended, no masses or organomegaly  Neurological - alert, oriented, normal speech, no focal findings or movement disorder noted  Musculoskeletal - no joint swelling or tenderness  Extremities -  no pedal edema, no clubbing or cyanosis  Skin - normal coloration, no rashes    Labs:     Recent Labs   Lab 02/04/22  0332 02/03/22  0331 02/02/22  2004   WBC 7.45 6.62 7.23   Hgb 11.0* 10.4*  9.9*   Hematocrit 32.1* 31.0* 30.1*   Platelets 214 210 217   MCV 90.9 94.5 94.4   Neutrophils  --   --  71.8     Recent Labs   Lab 02/04/22  0332 02/03/22  0331 02/02/22  2004   Sodium 142 138 132*   Potassium 4.3 4.3 5.1   Chloride 109 110 102   CO2 25 24 24    BUN 13.0 15.0 19.0   Creatinine 1.0 1.2 1.6*   Glucose 95 206* 266*   Calcium 8.4 8.0 8.1   Protein, Total  --   --  5.9*   Albumin  --   --  3.0*   AST (SGOT)  --   --  13   ALT  --   --  11   Alkaline Phosphatase  --   --  84   Bilirubin, Total  --   --  0.3     Glucose:    Recent Labs   Lab 02/04/22  0332 02/03/22  0331 02/02/22  2004   Glucose 95 206* 266*           Rads:   Radiological Procedure reviewed.  Radiology Results (24 Hour)       ** No results found for the last 24 hours. 04/04/22, MD  Pulmonary and critical care  02/04/22

## 2022-02-04 NOTE — PT Eval Note (Addendum)
[facility]  Physical Therapy Attempt Note    Patient:  [name] MRN#:  [id number]  Unit:  [facility] [address] [facility] INTERMEDIATE CARE Room/Bed:  A2805/A2805-A      Physical Therapy orders received. Therapy orders also received on 4/5. OT evaluation completed and determined pt had no acute therapy needs. Home with no needs is Christine rec and if symptoms persist, pt would benefit from Outpatient PT. Please refer to OT note on 4/5 for further details regarding mobility.  Completing duplicate therapy orders at this time.    Ranae Pila, [place], [place]  [id number]  02/04/2022 2:00 PM  7:00-3:30pm, M-F

## 2022-02-04 NOTE — Consults (Signed)
NEUROLOGY CONSULTATION    Date Time: 02/04/22 12:22 PM  Patient Name: [name]  Attending Physician: Sharlette Dense, MD      Assessment & Plan:   Non-specific dizziness - difficult for pt to characterize.  He is feeling better "in the last few hours" but given hx CVA in 2016 with similar symptoms - must consider CVA in the differential.  Orthostatics were negative here in the hospital.     Obtain MRI brain  PT/OT  Further work up depending on MRI findings and symptoms.    History of Present Illness:   Asked by Dr. Thelma Barge to consult on this 88 yo male with DM, hx CVA, hx dizziness who was admitted yesterday with c/o dizziness/unsteadiness on his feet.  History provision is somewhat unclear.  He reports some form of unsteadiness when trying to walk.  When asked if was primarily balance, or vertigo, or LH sensation - he appears to endorse all of these options.  He also reports headaches.  He denies focal weakness or numbness or diplopia.  Of note - he had dizziness complaints in 2016 with + pontine CVA seen on imaging during that admission.    Past Medical History:     Past Medical History:   Diagnosis Date    Diabetes mellitus     No diagnosis        Meds:      Scheduled Meds: PRN Meds:    heparin (porcine), 5,000 Units, Subcutaneous, Q8H SCH  insulin lispro, 1-4 Units, Subcutaneous, QHS  insulin lispro, 1-8 Units, Subcutaneous, TID AC  losartan, 25 mg, Oral, Daily  metFORMIN, 500 mg, Oral, BID Meals        Continuous Infusions:   acetaminophen, 650 mg, Q4H PRN  dextrose, 15 g of glucose, PRN   Or  dextrose, 12.5 g, PRN   Or  dextrose, 12.5 g, PRN   Or  glucagon (rDNA), 1 mg, PRN  hydrALAZINE, 10 mg, Q6H PRN  magnesium sulfate, 1 g, PRN  meclizine, 12.5 mg, TID PRN  melatonin, 3 mg, QHS PRN  naloxone, 0.2 mg, PRN  potassium & sodium phosphates, 2 packet, PRN  potassium chloride, 0-40 mEq, PRN   And  potassium chloride, 10 mEq, PRN          I personally reviewed all of the medications.  Medication list  generated using all available resources.  Elder abuse (physical)  - negative  Advanced care plan - reviewed from chart or in discussion with pt or family    No Known Allergies    Social & Family History:     Social History     Socioeconomic History    Marital status: Single   Tobacco Use    Smoking status: Some Days     Packs/day: 0.50     Types: Cigarettes    Smokeless tobacco: Current    Tobacco comments:     smokes 2-3 cigaretes wekly   Vaping Use    Vaping status: Never Used   Substance and Sexual Activity    Alcohol use: Yes     Comment: occassionally    Drug use: No     History reviewed. No pertinent family history.    Review of Systems:   Pt too cognitively or communication impaired to participate in ROS.    Physical Exam:   Blood pressure 137/66, pulse 80, temperature 97.6 F (36.4 C), temperature source Axillary, resp. rate 19, height 1.753 m (5\' 9" ), weight 70 kg (154 lb 5.2 oz),  SpO2 100 %.    Pt is sleeping when I arrive but eventually awakens to voice  Face is symmetric  Intact FTN bil  EOMI  No UE drift  5/5 strength x 4 ext  Upon sitting up - no induced symptoms.  Intact sensation to LT x 4 ext.    Labs:     Recent Labs   Lab 02/04/22  0332 02/03/22  0331 02/02/22  2004   Glucose 95 206* 266*   BUN 13.0 15.0 19.0   Creatinine 1.0 1.2 1.6*   Calcium 8.4 8.0 8.1   Sodium 142 138 132*   Potassium 4.3 4.3 5.1   Chloride 109 110 102   CO2 25 24 24    Albumin  --   --  3.0*   AST (SGOT)  --   --  13   ALT  --   --  11   Bilirubin, Total  --   --  0.3   Alkaline Phosphatase  --   --  84     Recent Labs   Lab 02/04/22  0332 02/03/22  0331 02/02/22  2004   WBC 7.45 6.62 7.23   Hgb 11.0* 10.4* 9.9*   Hematocrit 32.1* 31.0* 30.1*   MCV 90.9 94.5 94.4   MCH 31.2 31.7 31.0   MCHC 34.3 33.5 32.9   Platelets 214 210 217         No results for input(s): PTT, PT, INR in the last 72 hours.       Radiology Results (24 Hour)       ** No results found for the last 24 hours. **             All recent brain and spine  imaging (MRI, CT) results reviewed.    Chart reviewed    Code status confirmed    Case discussed with: patient and Dr. Thelma Barge    40 minutes;  involving time spent examining patient, in counseling or coordination of care, reviewing test results, and in documentation.    Signed by: Ardelle Anton, MD  Spectralink: [phone]       Answering Service: [phone]

## 2022-02-04 NOTE — Progress Notes (Signed)
Initial Case Management Assessment and Discharge Planning  [facility]   Patient Name: [name], [name]   Date of Birth 07-11-1932   Attending Physician: Sharlette Dense, MD   Primary Care Physician: [name], MD   Length of Stay 0   Reason for Consult / Chief Complaint IDPA/dizzines, CO2 retention        Situation   Admission DX:   1. CO2 retention        A/O Status: Unable to Assess    LACE Score: 6    Patient admitted from: ER  Admission Status: inpatient    Health Care Agent: Self         Background     Advanced directive:   <no information>    Code Status:   Full Code     Residence: Apartment    PCP: [name], MD  Patient Contact:   [phone] (home) [phone] (work)    [phone] (mobile)     Emergency contact:   Extended Emergency Contact Information  Primary Emergency Contact: Gwyndolyn Kaufman [place]  Home Phone: [phone]  Relation: Sister      ADL/IADL's: Independent  Previous Level of function: 7 Independent     DME: None    Pharmacy:     [id number] Mackie Pai, [place] - [phone] AT [facility] [address]  [address] [place] [address]  Phone: [phone] Fax: [phone]      Prescription Coverage: Yes    Home Health: The patient is not currently receiving home health services.    Previous SNF/AR: n/a    COVID Vaccine Status: vaccinated    Date First IMM given: n/a  UAI on file?: No  Transport for discharge? Mode of transportation: Sales executive - Family/Friend to drive patient  Agreeable to Home with family post-discharge:  Yes     Assessment   Cm was able to verify some information with pt's sister. Pt lives alone with no support from family per Denyce Robert. DCP home vs SNF CM will continue to follow.  BARRIERS TO DISCHARGE: medical clearance     Recommendation   D/C Plan A: Home with family    D/C Plan B:     D/C Plan C:        Alvester Morin RN BSN  Care Manager I  [facility]  [phone]

## 2022-02-05 LAB — CBC
Absolute NRBC: 0 10*3/uL (ref 0.00–0.00)
Hematocrit: 35.2 % — ABNORMAL LOW (ref 37.6–49.6)
Hgb: 11.7 g/dL — ABNORMAL LOW (ref 12.5–17.1)
MCH: 30.7 pg (ref 25.1–33.5)
MCHC: 33.2 g/dL (ref 31.5–35.8)
MCV: 92.4 fL (ref 78.0–96.0)
MPV: 10.4 fL (ref 8.9–12.5)
Nucleated RBC: 0 /100 WBC (ref 0.0–0.0)
Platelets: 236 10*3/uL (ref 142–346)
RBC: 3.81 10*6/uL — ABNORMAL LOW (ref 4.20–5.90)
RDW: 13 % (ref 11–15)
WBC: 7.78 10*3/uL (ref 3.10–9.50)

## 2022-02-05 LAB — GFR: EGFR: >60

## 2022-02-05 LAB — BASIC METABOLIC PANEL
Anion Gap: 7 (ref 5.0–15.0)
BUN: 11 mg/dL (ref 9.0–28.0)
CO2: 24 mEq/L (ref 17–29)
Calcium: 8.7 mg/dL (ref 7.9–10.2)
Chloride: 108 mEq/L (ref 99–111)
Creatinine: 0.9 mg/dL (ref 0.5–1.5)
Glucose: 95 mg/dL (ref 70–100)
Potassium: 4.2 mEq/L (ref 3.5–5.3)
Sodium: 139 mEq/L (ref 135–145)

## 2022-02-05 LAB — GLUCOSE WHOLE BLOOD - POCT
Whole Blood Glucose POCT: 223 mg/dL — ABNORMAL HIGH (ref 70–100)
Whole Blood Glucose POCT: 88 mg/dL (ref 70–100)

## 2022-02-05 MED ORDER — DSS 100 MG PO CAPS
100.0000 mg | ORAL_CAPSULE | Freq: Every day | ORAL | 0 refills | Status: AC
Start: 2022-02-06 — End: ?

## 2022-02-05 NOTE — Progress Notes (Signed)
Progress Note    Date Time: 02/05/22 10:15 AM  Patient Name: [name]  Attending Physician: Sharlette Dense, MD      Assessment & Plan:   Pt c/o unsteady gait/non-specific dizziness.    MRI brain - negative for acute findings.    Pt has hard time explaining his symptoms but thinks they are resolved at this time.    -  Possible orthostasis - but now improved.  -  Ok to discharged  -  Discussed with Dr. Thelma Barge.    Subjective:   Pt feels well today    Review of Systems:   No headache, eye, ear nose, throat problems; no coughing or wheezing or shortness of breath, No chest pain or orthopnea, no abdominal pain, nausea or vomiting, No pain in the body or extremities, no psychiatric, neurological, endocrine, hematological or cardiac complaints except as noted above.     Physical Exam:   Blood pressure 123/65, pulse 85, temperature 98.1 F (36.7 C), temperature source Axillary, resp. rate 21, height 1.753 m (5\' 9" ), weight 69.9 kg (154 lb 1.6 oz), SpO2 97 %.    HEENT: Normocephalic. No icter or congestion  Neck: supple, no lymphadenopathy, no thyromegaly, no JVD  Extremities: no clubbing, cyanosis, or edema  Skin: no rashes or lesions noted    Neuro:  Level of consciousness:  Alert and appropriate  Facial Movements: symmetric  Strength:  No upper extremity drift  Sensation to light touch: Intact bilaterally      Meds:      Scheduled Meds: PRN Meds:    docusate sodium, 100 mg, Oral, Daily  heparin (porcine), 5,000 Units, Subcutaneous, Q8H SCH  insulin lispro, 1-4 Units, Subcutaneous, QHS  insulin lispro, 1-8 Units, Subcutaneous, TID AC  losartan, 25 mg, Oral, Daily  metFORMIN, 500 mg, Oral, BID Meals        Continuous Infusions:   acetaminophen, 650 mg, Q4H PRN  dextrose, 15 g of glucose, PRN   Or  dextrose, 12.5 g, PRN   Or  dextrose, 12.5 g, PRN   Or  glucagon (rDNA), 1 mg, PRN  hydrALAZINE, 10 mg, Q6H PRN  magnesium sulfate, 1 g, PRN  meclizine, 12.5 mg, TID PRN  melatonin, 3 mg, QHS PRN  naloxone, 0.2 mg,  PRN  polyethylene glycol, 17 g, Daily PRN  potassium & sodium phosphates, 2 packet, PRN  potassium chloride, 0-40 mEq, PRN   And  potassium chloride, 10 mEq, PRN            I personally reviewed all of the medications    Labs:     Recent Labs   Lab 02/05/22  0448 02/04/22  0332 02/03/22  0331 02/02/22  2004   Glucose 95 95 206* 266*   BUN 11.0 13.0 15.0 19.0   Creatinine 0.9 1.0 1.2 1.6*   Calcium 8.7 8.4 8.0 8.1   Sodium 139 142 138 132*   Potassium 4.2 4.3 4.3 5.1   Chloride 108 109 110 102   CO2 24 25 24 24    Albumin  --   --   --  3.0*   AST (SGOT)  --   --   --  13   ALT  --   --   --  11   Bilirubin, Total  --   --   --  0.3   Alkaline Phosphatase  --   --   --  84     Recent Labs   Lab 02/05/22  0448 02/04/22  5409 02/03/22  0331   WBC 7.78 7.45 6.62   Hgb 11.7* 11.0* 10.4*   Hematocrit 35.2* 32.1* 31.0*   MCV 92.4 90.9 94.5   MCH 30.7 31.2 31.7   MCHC 33.2 34.3 33.5   Platelets 236 214 210         No results for input(s): PTT, PT, INR in the last 72 hours.       Radiology Results (24 Hour)       Procedure Component Value Units Date/Time    MRI Brain WO Contrast [[id number]] Collected: 02/04/22 1656    Order Status: Completed Updated: 02/04/22 1659    Narrative:      MRI BRAIN WO CONTRAST:02/04/2022 3:45 PM     CLINICAL HISTORY: .persistent dizziness/unsteady gait     TECHNIQUE: Images were obtained in multiple planes utilizing T1, T2 and  diffusion pulse sequences.    FINDINGS: A moderate amount of T2 signal hyperintensity in a patchy  distribution is present in the deep white matter of each hemisphere  consistent with small vessel disease change. No other focal areas of  abnormal signal intensity are present. The ventricles and sulci are  within normal limits for the patient's age. No intracerebral mass or  hemorrhage is present, and no extra-axial collections are present.      Impression:       White matter small vessel disease changes. No other  significant abnormality.    Laurena Slimmer, MD  02/04/2022 4:57 PM              All recent brain and spine imaging (MRI, CT) results reviewed.    Code status listed in chart confirmed    The notes from the last 24 hours were reviewed.     Case discussed with: patient and Dr. Thelma Barge    35 minutes;  involving time spent examining patient, in counseling or coordination of care, reviewing test results, and in documentation.    Signed by: Ardelle Anton, MD  Spectralink: [phone]       Answering Service: [phone]

## 2022-02-05 NOTE — Discharge Summary (Signed)
DISCHARGE SUMMARY    Date Time: 02/05/22 6:22 PM  Patient Name: [name]  Attending Physician: No att. providers found    Date of Admission:   02/02/2022    Date of Discharge:   02/05/2022    Reason for Admission:   CO2 retention [E87.29]    Discharge Dx:   Present on Admission:   CO2 retention  Dizziness due to dehydration.  CT scan, MRI negative  CO2 retention.  Resolved  Acute kidney injury/dehydration  Anemia  Diabetes mellitus  Hypertension  Hx orthostatic hypotension  Hyperlipidemia  Hx BPH    Consultations:   Treatment Team:   Consulting Physician: Lattie Haw, MD  Consulting Physician: Ardelle Anton, MD  Consulting Physician: Gean Quint, MD      Procedures performed:       Discharge Medications:        Discharge Medication List        Taking      acetaminophen 325 MG tablet  Dose: 650 mg  Commonly known as: TYLENOL  Take 2 tablets (650 mg total) by mouth every 4 (four) hours as needed for Pain.     docusate sodium 100 MG capsule  Dose: 100 mg  Commonly known as: COLACE  Start taking on: February 06, 2022  Take 1 capsule (100 mg) by mouth daily     losartan 25 MG tablet  Dose: 25 mg  Commonly known as: COZAAR  Take 1 tablet (25 mg) by mouth daily     meclizine 12.5 MG tablet  Dose: 12.5 mg  Commonly known as: ANTIVERT  Take 1 tablet (12.5 mg total) by mouth 3 (three) times daily as needed for Dizziness For head Spinning     metFORMIN 1000 MG tablet  Dose: 1,000 mg  Commonly known as: GLUCOPHAGE  Take 1 tablet (1,000 mg total) by mouth every morning with breakfast.                Hospital Course:   Details of admission and consultations on record.  [name] is a 88 y.o. male   with history of diabetes hypertension hyperlipidemia BPH who only takes medicine for diabetes presented to emergency room with dizziness , patient stated that he was walking around his neighborhood when he felt dizzy feeling of as he will collapse had generalized weakness he felt thirsty admits not drinking enough  fluids patient also had some lightheadedness no focal neurological symptoms no chest pain palpitation loss of consciousness denies nausea vomiting abdominal pain.  He drank a liter of water then he called EMS and was brought to the emergency room in ER patient was found with stable hemodynamics.  He had elevated CO2 H&H 10.4/31.0 platelet count 210 serum creatinine was 1.6  Blood sugar elevated, anion gap was 4.  Head CT negative for acute process.  Patient was admitted to hospital for further management and evaluation.  Clinically he looks severely dehydrated.  Hospital course:  Patient was started on IV fluids increase p.o. intake he had CO2 retention , repeat ABG showed improvement in CO2 etiologies was likely related to patient's feeling of presyncope type of symptoms and dizziness.  With hydration his renal functions improved serum creatinine normalized.  He was still feeling dizzy despite hydration, neurology was consulted, he had MRI of the brain negative for acute process.  His dizziness is related to patient's volume status/dehydration.  Blood pressures controlled with above medication.  He was resumed on metformin once serum creatinine was improved.  He  is advised to drink a lot of fluid.  Patient is ambulating symptoms are improved he is can be discharged home with final diagnosis of dizziness related to dehydration, CO2 retention related to patient's near syncopal symptoms.  He had acute kidney injury-prerenal related to dehydration.  He also had history of orthostatic hypotension, advised to drink more fluids.  Patient is discharged home further hospital course per daily notes.      Laboratory Data     CBC  Recent Labs   Lab 02/05/22  0448 02/04/22  0332 02/03/22  0331 02/02/22  2004   WBC 7.78 7.45 6.62 7.23   Hgb 11.7* 11.0* 10.4* 9.9*   Hematocrit 35.2* 32.1* 31.0* 30.1*   Platelets 236 214 210 217   MCV 92.4 90.9 94.5 94.4   Neutrophils  --   --   --  71.8       CMP  Recent Labs   Lab 02/05/22  0448  02/04/22  0332 02/03/22  0331 02/02/22  2004   Sodium 139 142 138 132*   Potassium 4.2 4.3 4.3 5.1   Chloride 108 109 110 102   CO2 24 25 24 24    BUN 11.0 13.0 15.0 19.0   Creatinine 0.9 1.0 1.2 1.6*   Glucose 95 95 206* 266*   Calcium 8.7 8.4 8.0 8.1   Protein, Total  --   --   --  5.9*   Albumin  --   --   --  3.0*   AST (SGOT)  --   --   --  13   ALT  --   --   --  11   Alkaline Phosphatase  --   --   --  84   Bilirubin, Total  --   --   --  0.3       Lipid panel              Lab Results   Component Value Date    TSH 1.18 02/02/2022       Cardiac enzymes  Recent Labs   Lab 02/02/22  2004   hs Troponin-I <2.7             Culture:   Microbiology Results (last 15 days)       Procedure Component Value Units Date/Time    COVID-19 (SARS-CoV-2) only ([facility] Rapid) asymptomatic admission [[id number]][[id number]] Collected: 02/02/22 2258    Order Status: Completed Specimen: Nasopharyngeal Updated: 02/02/22 2327     Purpose of COVID testing Screening     SARS-CoV-2 Specimen Source Nasal Swab     SARS CoV 2 Overall Result Not Detected     Comment: __________________________________________________  -A result of "Detected" indicates POSITIVE for the    presence of SARS CoV-2 RNA  -A result of "Not Detected" indicates NEGATIVE for the    presence of SARS CoV-2 RNA  __________________________________________________________  Test performed using the Roche cobas Liat SARS-CoV-2 assay. This assay is  only for use under the Food and Drug Administration's Emergency Use  Authorization. This is a real-time RT-PCR assay for the qualitative  detection of SARS-CoV-2 RNA. Viral nucleic acids may persist in vivo,  independent of viability. Detection of viral nucleic acid does not imply the  presence of infectious virus, or that virus nucleic acid is the cause of  clinical symptoms. Negative results do not preclude SARS-CoV-2 infection and  should not be used as the sole basis for diagnosis, treatment or other  patient management decisions.  Negative  results must be combined with  clinical observations, patient history, and/or epidemiological information.  Invalid results may be due to inhibiting substances in the specimen and  recollection should occur. Please see Fact Sheets for patients and providers  located:  WirelessDSLBlog.no         Narrative:      o Collect and clearly label specimen type:  o PREFERRED-Upper respiratory specimen: One Nasal Swab in  Transport Media.  o Hand deliver to laboratory ASAP  Indication for testing->Extended care facility admission to  semi private room  Screening            All radiology result for current encounter:  MRI Brain WO Contrast    Result Date: 02/04/2022   White matter small vessel disease changes. No other significant abnormality. Laurena Slimmer, MD 02/04/2022 4:57 PM    CT Head WO Contrast    Result Date: 02/02/2022  No acute intracranial abnormality detected. Scattered foci of soft tissue gas are present inferiorly to the skull base, possibly venous. Carleene Overlie, MD 02/02/2022 10:30 PM    Chest AP Portable    Result Date: 02/02/2022  No evidence of acute cardiopulmonary abnormality. Christy Gentles MD, MD 02/02/2022 9:46 PM     Echo Results       None              Physical Exam:   BP 123/65   Pulse 79   Temp 97.4 F (36.3 C) (Axillary)   Resp 14   Ht 1.753 m (5\' 9" )   Wt 69.9 kg (154 lb 1.6 oz)   SpO2 97%   BMI 22.76 kg/m     General appearance - alert, and in no distress  HEENT: Normocephalic,atraumatic, pupil equal  Neck - supple  Chest - clear to auscultation  Heart - normal rate and regular rhythm  Abdomen - bowel sounds normal, soft, non distended  Extremities - no pedal edema  Neurological - Alert , No Gross Focal deficits     Discharge condition:   Stable     Discharge  Diet :   Cardiac , Increase fluid intake     Discharge Instructions:   Follow up:     [name], MD  [address]  [place] [address]  [phone]    Schedule an appointment as soon as possible for  a visit          Sharlette Dense, MD  02/05/2022  6:22 PM  Time spent 45  minutes

## 2022-02-05 NOTE — Progress Notes (Signed)
PULMONARY PROGRESS NOTE                                                                                                              [phone]    Date Time: 02/05/22 10:00 AM  Patient Name: [name], [name] 88 y.o. male admitted with CO2 retention  Admit Date: 02/02/2022    Patient status: Observation  Hospital Day: 0           Assessment:     Near syncope  Transient hypercarbia resolved  Diabetes mellitus  Acute renal failure  Hyperlipidemia    Plan:     Hypercarbia has resolved  Discharge planning  Glycemic control        Subjective:       Patient admitted with near syncope now asymptomatic.  Hypercarbia has improved.  There was no witnessed seizure            Medications:       Review of Systems:       General ROS:  Afebrile   ENT ROS:  No sore throat no nasal discharge  Endocrine ROS:  No fatigue  Respiratory ROS:  No shortness of breath wheezing cough chest congestion   Cardiovascular ROS:  No chest pain or palpitation  Gastrointestinal ROS:  No nausea vomiting diarrhea.  No melanotic stool  Genito-Urinary ROS:  No burning in the urine or hematuria  Musculoskeletal ROS:  No musculoskeletal deformities  Neurological ROS:  No stroke seizure disorder  Dermatological ROS:  No skin rash        Physical Exam:     Vitals:    02/05/22 1140   BP: 123/65   Pulse: 79   Resp: 14   Temp: 97.4 F (36.3 C)   SpO2: 97%         Intake/Output Summary (Last 24 hours) at 02/05/2022 1702  Last data filed at 02/05/2022 0981  Gross per 24 hour   Intake --   Output 1050 ml   Net -1050 ml           General appearance - no visible respiratory distress patient does not appear toxic  Mental status -  Alert and oriented x3  Eyes - EOMI PERRLA  Nose - no nasal discharge  Mouth - mucous membrane is moist.    Neck - no JVD lymphadenopathy or thyromegaly  Chest - clear to auscultation  Heart - S1-S2 RRR no S3-S4 no murmur  Abdomen - soft nontender bowel sounds are normal with no hepatosplenomegaly  Neurological - no motor or sensory  deficit  Extremities - no edema clubbing or cyanosis  Skin - no skin rash      Labs:     CBC w/Diff CMP   Recent Labs   Lab 02/05/22  0448 02/04/22  0332 02/03/22  0331 02/02/22  2004   WBC 7.78 7.45 6.62 7.23   Hgb 11.7* 11.0* 10.4* 9.9*   Hematocrit 35.2* 32.1* 31.0* 30.1*   Platelets 236 214 210 217   MCV 92.4 90.9 94.5 94.4  Neutrophils  --   --   --  71.8       PT/INR         Recent Labs   Lab 02/05/22  0448 02/04/22  0332 02/03/22  0331 02/02/22  2004   Sodium 139 142 138 132*   Potassium 4.2 4.3 4.3 5.1   Chloride 108 109 110 102   CO2 24 25 24 24    BUN 11.0 13.0 15.0 19.0   Creatinine 0.9 1.0 1.2 1.6*   Glucose 95 95 206* 266*   Calcium 8.7 8.4 8.0 8.1   Protein, Total  --   --   --  5.9*   Albumin  --   --   --  3.0*   AST (SGOT)  --   --   --  13   ALT  --   --   --  11   Alkaline Phosphatase  --   --   --  84   Bilirubin, Total  --   --   --  0.3      Glucose POCT   Recent Labs   Lab 02/05/22  0448 02/04/22  0332 02/03/22  0331 02/02/22  2004   Glucose 95 95 206* 266*        Recent Labs   Lab 02/02/22  2004   hs Troponin-I <2.7         ABGs:    ABG CollectionSite   Date Value Ref Range Status   02/03/2022 Right Radl  Final     Allen's Test   Date Value Ref Range Status   02/03/2022 Yes  Final     pH, Arterial   Date Value Ref Range Status   02/03/2022 7.399 7.350 - 7.450 Final     pCO2, Arterial   Date Value Ref Range Status   02/03/2022 39.8 35.0 - 45.0 mmhg Final     pO2, Arterial   Date Value Ref Range Status   02/03/2022 85.5 80.0 - 90.0 mmhg Final     HCO3, Arterial   Date Value Ref Range Status   02/03/2022 24.1 23.0 - 29.0 mEq/L Final     Base Excess, Arterial   Date Value Ref Range Status   02/03/2022 -0.1 -2.0 - 2.0 mEq/L Final     O2 Sat, Arterial   Date Value Ref Range Status   02/03/2022 95.3 95.0 - 100.0 % Final       Urinalysis        Invalid input(s): LEUKOCYTESUR      Rads:   No results found.      Gean Quint MD  02/05/2022

## 2022-02-05 NOTE — Plan of Care (Incomplete)
Signed        Neuro: AO x 4  Resp:  Bilateral breath sounds clear; Encouraged IS use and deep breathing exercises. Tolerating RA.  CV: Regular, denies chest pain.  GI:  Active bowel sounds present. Tolerating regular diet. No emesis on this shift, no nausea present - abd soft, nontender.  GU: Voiding freely.  M/Activity: Active - in all extremities; standby assist. Turns and repositions self. No distress noted  Integ: WDL for ethnicity  IV:  IV flushed and saline locked   Pain: no pain meds administered  DVT Prophylaxis: Bilateral SCDs on LEs.  Safety: Moderate Falls Risk. Informed to call for assistance prior to getting up. Bed remains in lowest position and 3/4 side rails up.  Psychosocial: Calm and follows commands.   Shift Events: n/a  Plan of Care: Monitor BP     Pt resting comfortably in bed with no signs of distress or discomfort  Call light & personal belongings within reach, bed alarm on. Floor mat at bedside.          -----------  Problem: Moderate/High Fall Risk Score >5  Goal: Patient will remain free of falls  Outcome: Progressing  Flowsheets (Taken 02/04/2022 2000)  High (Greater than 13):   HIGH-Visual cue at entrance to patient's room   HIGH-Bed alarm on at all times while patient in bed   HIGH-Apply yellow "Fall Risk" arm band   HIGH-Consider use of low bed   HIGH-Initiate use of floor mats as appropriate     Problem: Safety  Goal: Patient will be free from injury during hospitalization  Outcome: Progressing  Flowsheets (Taken 02/05/2022 0330)  Patient will be free from injury during hospitalization:   Assess patient's risk for falls and implement fall prevention plan of care per policy   Provide and maintain safe environment   Use appropriate transfer methods   Include patient/ family/ care giver in decisions related to safety   Assess for patients risk for elopement and implement Elopement Risk Plan per policy     Problem: Hemodynamic Status: Cardiac  Goal: Stable vital signs and fluid  balance  Outcome: Progressing  Flowsheets (Taken 02/05/2022 0330)  Stable vital signs and fluid balance:   Assess signs and symptoms associated with cardiac rhythm changes   Monitor/assess vital signs and telemetry per unit protocol   Monitor intake/output per unit protocol and/or LIP order   Monitor lab values   Monitor for leg swelling/edema and report to LIP if abnormal     Problem: Impaired Mobility  Goal: Mobility/Activity is maintained at optimal level for patient  Outcome: Progressing  Flowsheets (Taken 02/05/2022 0330)  Mobility/activity is maintained at optimal level for patient:   Increase mobility as tolerated/progressive mobility   Encourage independent activity per ability   Perform active/passive ROM   Plan activities to conserve energy, plan rest periods   Assess for changes in respiratory status, level of consciousness and/or development of fatigue   Consult/collaborate with Physical Therapy and/or Occupational Therapy

## 2022-02-05 NOTE — Progress Notes (Signed)
Patient discharged to home via wheelchair accompanied by hospital transport team with all the belongings pt came with. Discharge instructions and follow up plan explained to pt and he demonstrated understanding, no further questions at this time. IV line removed, taxi picking up pt at the patient entrance    Latest VS    Temp 976.4  HR 79  RR 14  BP 123/65

## 2022-02-05 NOTE — Progress Notes (Signed)
TC from [employer]" Aundria Rud, RN CM about a shower chair. [facility] team does not provide shower chairs. Patient has to by the shower chair out of pocket at a retail store. Shower chairs are not covered by benefits. Alex to update the patient and team. [facility] team will continue to follow.     Adine Madura RN, Secondary school teacher   [phone]  [email or web address]

## 2022-02-05 NOTE — Discharge Instr - AVS First Page (Addendum)
Date of Admission: 02/02/2022    Date of Discharge: 02/05/2022    Discharge Physician: Sharlette Dense, MD, MD    Dear [name],     Thank you for choosing [facility] for your emergency care needs. We strive to provide EXCELLENT care to you and your family.     In an effort to explain clearly why you were here in the hospital, I've written a very brief summary. I hope that you find it useful. Other details including formal diagnosis, medication changes, follow up appointment recommendations, and access to MyChart for formal medical records can be found in this packet.       You were admitted for:     Dizziness due to dehydration.  CT scan, MRI negative  CO2 retention.  Acute kidney injury/dehydration  Anemia  Diabetes mellitus  Hypertension  Hx orthostatic hypotension  Hyperlipidemia  Hx BPH  Keep yourself well-hydrated.  Preferably drink Gatorade  If you were prescribed medications, they were either sent to your pharmacy or provided to you as paper prescriptions.      Please Make sure to follow up with your primary care doctor within 1 week.     Please don't hesitate to call me with any questions. It was a pleasure taking care of you, please do not hesitate to call me on number listed below for any questions.      Finally, as your discharging physician, you may be receiving a survey which is regarding my care. I would greatly value and appreciate your feedback as I strive for excellence.     Respectfully yours,    Sharlette Dense, MD, MD        [facility]   [address]   [address]   [place] [address]   [phone]

## 2022-08-27 ENCOUNTER — Emergency Department
Admission: EM | Admit: 2022-08-27 | Discharge: 2022-08-27 | Disposition: A | Discharge status: Home / Self Care | Payer: Medicare Other | Attending: Emergency Medicine | Admitting: Emergency Medicine

## 2022-08-27 DIAGNOSIS — K120 Recurrent oral aphthae: Secondary | ICD-10-CM

## 2022-08-27 DIAGNOSIS — R1310 Dysphagia, unspecified: Secondary | ICD-10-CM | POA: Insufficient documentation

## 2022-08-27 MED ORDER — NYSTATIN 100000 UNIT/ML MT SUSP
10.0000 mL | Freq: Four times a day (QID) | ORAL | 0 refills | Status: AC
Start: 2022-08-27 — End: ?

## 2022-08-27 MED ORDER — ACETAMINOPHEN 500 MG PO TABS
1000.0000 mg | ORAL_TABLET | Freq: Once | ORAL | Status: AC
Start: 2022-08-27 — End: 2022-08-27
  Administered 2022-08-27: 1000 mg via ORAL
  Filled 2022-08-27: qty 2

## 2022-08-27 MED ORDER — LIDOCAINE(URO-JET) 2% JELLY (WRAP)
Freq: Once | CUTANEOUS | Status: AC
Start: 2022-08-27 — End: 2022-08-27
  Filled 2022-08-27: qty 10

## 2022-08-27 MED ORDER — BENZOCAINE 10 % MT GEL
OROMUCOSAL | 0 refills | Status: AC | PRN
Start: 2022-08-27 — End: ?

## 2022-08-27 NOTE — ED Provider Notes (Signed)
EMERGENCY DEPARTMENT HISTORY AND PHYSICAL EXAM     None        Date: 08/27/2022  Patient Name: [name]    History of Presenting Illness     Chief Complaint   Patient presents with    Tongue Pain    Dysphagia         Chief Complaint: tongue pain    History Provided By: pt      History: [name] is a 88 y.o. male.presents c/o tongue pain, right side, pain when he tries to eat or drink. No fever, no sore throat. Denies recent uri    PCP: [name], MD      No current facility-administered medications for this encounter.     Current Outpatient Medications   Medication Sig Dispense Refill    acetaminophen (TYLENOL) 325 MG tablet Take 2 tablets (650 mg total) by mouth every 4 (four) hours as needed for Pain.  0    benzocaine (ORAJEL) 10 % mucosal gel Use as directed in the mouth or throat as needed for Pain 7 g 0    diphenhydrAMINE-lidocaine viscous-nystatin-alum & mag hydroxide-simethicone (MAGIC MOUTHWASH) suspension Take 10 mLs by mouth 4 (four) times daily 120 mL 0    docusate sodium (COLACE) 100 MG capsule Take 1 capsule (100 mg) by mouth daily 10 capsule 0    losartan (COZAAR) 25 MG tablet Take 1 tablet (25 mg) by mouth daily 30 tablet 0    meclizine (ANTIVERT) 12.5 MG tablet Take 1 tablet (12.5 mg total) by mouth 3 (three) times daily as needed for Dizziness For head Spinning 15 tablet 0    metFORMIN (GLUCOPHAGE) 1000 MG tablet Take 1 tablet (1,000 mg total) by mouth every morning with breakfast.         Past History     Past Medical History:  Past Medical History:   Diagnosis Date    Diabetes mellitus     No diagnosis        Past Surgical History:  Past Surgical History:   Procedure Laterality Date    APPENDECTOMY (OPEN)      EGD, BIOPSY N/A 06/30/2015    Procedure: EGD, BIOPSY;  Surgeon: [name], MD;  Location: [facility];  Service: Gastroenterology;  Laterality: N/A;    EGD, COLONOSCOPY N/A 08/04/2015    Procedure: EGD, COLONOSCOPY;  Surgeon: [name], MD;  Location: [facility];   Service: Gastroenterology;  Laterality: N/A;       Family History:  History reviewed. No pertinent family history.    Social History:  Social History     Tobacco Use    Smoking status: Some Days     Packs/day: .5     Types: Cigarettes    Smokeless tobacco: Current    Tobacco comments:     smokes 2-3 cigaretes wekly   Vaping Use    Vaping Use: Never used   Substance Use Topics    Alcohol use: Yes     Comment: occassionally    Drug use: No       Allergies:  No Known Allergies    Review of Systems     Review of Systems   HENT:          Tongue pain       Physical Exam   BP 123/66   Pulse 78   Temp 98.6 F (37 C) (Oral)   Resp 20   Wt 64 kg   SpO2 99%   BMI  20.84 kg/m     Physical Exam  Constitutional:       General: He is not in acute distress.     Appearance: He is not diaphoretic.   HENT:      Head: Normocephalic and atraumatic.      Mouth/Throat:      Tongue: Lesions present.      Pharynx: No oropharyngeal exudate or posterior oropharyngeal erythema.      Comments: Right lateral border of tongue white tender ulcer. No swelling'   Eyes:      General: No scleral icterus.        Right eye: No discharge.         Left eye: No discharge.      Conjunctiva/sclera: Conjunctivae normal.   Cardiovascular:      Rate and Rhythm: Normal rate and regular rhythm.   Pulmonary:      Effort: Pulmonary effort is normal. No respiratory distress.      Breath sounds: Normal breath sounds. No wheezing or rales.   Abdominal:      General: There is no distension.      Palpations: Abdomen is soft.      Tenderness: There is no abdominal tenderness. There is no guarding or rebound.   Skin:     General: Skin is warm and dry.   Neurological:      Mental Status: He is alert and oriented to person, place, and time.   Psychiatric:         Mood and Affect: Mood and affect normal.          Diagnostic Study Results     Labs -     Results       ** No results found for the last 24 hours. **            Radiologic Studies -   Radiology Results (24  Hour)       ** No results found for the last 24 hours. **        .      Medical Decision Making   I am the first provider for this patient.    I reviewed the vital signs, available nursing notes, past medical history, past surgical history, family history and social history.    Vital Signs-Reviewed the patient's vital signs.   Patient Vitals for the past 12 hrs:   BP Temp Pulse Resp   08/27/22 1809 -- -- -- 20   08/27/22 1723 123/66 98.6 F (37 C) 78 --       Pulse Oximetry Analysis - Normal 99% on RA    Medical Decision Making  [name] is a 88 y.o. male.presents c/o tongue pain, right side, pain when he tries to eat or drink. No fever, no sore throat. Denies recent uri      Ddx: aphthous ulcer,     Amount and/or Complexity of Data Reviewed  Labs:      Details: Considered ordering labs but did not, normal vitals, no oropharyngeal swelling.   Radiology:      Details: Considered ordering ct soft tissue neck, but did not, no stridor, no trismus, no oropharyngeal swelling.     Risk  OTC drugs.  Risk Details: Pt advised likely viral. Magic mouthwash prescribed. F/u with Dr. Selena Batten and with ENT if no improvement        CHART OWNERSHIP: I, Elliot Cousin, MD-C, am the primary clinician of record.    Diagnosis  Clinical Impression:   1. Aphthous ulcer of mouth        _______________________________       Elliot Cousin, MD  08/27/22 2302

## 2022-08-27 NOTE — ED Triage Notes (Signed)
[facility] EMERGENCY DEPARTMENT  Provider in Triage Note        Patient Name: Ardean Melroy    Chief Complaint:   Chief Complaint   Patient presents with    Tongue Pain    Dysphagia       HPI: Zlatan Hornback is a 88 y.o. male, PMH DM who has had a rapid medical screening evaluation initiated by myself. Presents for burning tongue pain that began yesterday while brushing his teeth. Notes difficulty swallowing saliva due to the tongue pain, denies sore throat, lip swelling, difficulty breathing, exposure to allergens, trauma, other complaints.     Medical/Surgical/Social history: as per HPI    Vitals: BP 123/66   Pulse 78   Temp 98.6 F (37 C) (Oral)   Resp 20   Wt 64 kg   SpO2 99%   BMI 20.84 kg/m     Pertinent brief exam:   Examination of area of concern: oropharynx patent, no lacerations noted to tongue, no tongue swelling or angioedema, no respiratory distress, no tonsillar swelling, patient spitting up saliva in triage    Preliminary orders: none    Symptom based preliminary diagnosis/MDM: tongue pain     Patient advised to remain in the ED until further evaluation can be performed. Patient instructed to notify staff of any changes in condition while waiting.  This assessment is an initial evaluation to expedite care.

## 2022-08-27 NOTE — ED Triage Notes (Signed)
Pt with c/o tongue pain that began yesterday. Pt unsure if pt cut tongue. Pt denies sore throat, injury, itchiness, CP, SOB, N/V/D, fevers, chills. Pt states tongue burns. Pt states difficulty eating, drinking, swallowing. Pt states pain is 10/10. Pt denied taking any medication prior to arrival. Pt unable to swallow saliva in triage.

## 2022-08-30 ENCOUNTER — Emergency Department
Admission: EM | Admit: 2022-08-30 | Discharge: 2022-08-30 | Discharge status: Against Medical Advice | Payer: Medicare Other | Attending: Internal Medicine | Admitting: Internal Medicine

## 2022-08-30 DIAGNOSIS — R1310 Dysphagia, unspecified: Secondary | ICD-10-CM | POA: Insufficient documentation

## 2022-08-30 DIAGNOSIS — R42 Dizziness and giddiness: Secondary | ICD-10-CM | POA: Insufficient documentation

## 2022-08-30 DIAGNOSIS — K089 Disorder of teeth and supporting structures, unspecified: Secondary | ICD-10-CM | POA: Insufficient documentation

## 2022-08-30 LAB — BASIC METABOLIC PANEL
Anion Gap: 10 (ref 5.0–15.0)
BUN: 19 mg/dL (ref 9.0–28.0)
CO2: 24 mEq/L (ref 17–29)
Calcium: 9.1 mg/dL (ref 7.9–10.2)
Chloride: 109 mEq/L (ref 99–111)
Creatinine: 1.2 mg/dL (ref 0.5–1.5)
Glucose: 90 mg/dL (ref 70–100)
Potassium: 4.9 mEq/L (ref 3.5–5.3)
Sodium: 143 mEq/L (ref 135–145)
eGFR: 58 mL/min/{1.73_m2} — AB (ref >=60)

## 2022-08-30 LAB — CBC AND DIFFERENTIAL
Absolute NRBC: 0 10*3/uL (ref 0.00–0.00)
Basophils Absolute Automated: 0.04 10*3/uL (ref 0.00–0.08)
Basophils Automated: 0.6 %
Eosinophils Absolute Automated: 0.18 10*3/uL (ref 0.00–0.44)
Eosinophils Automated: 2.6 %
Hematocrit: 35.7 % — ABNORMAL LOW (ref 37.6–49.6)
Hgb: 11.4 g/dL — ABNORMAL LOW (ref 12.5–17.1)
Immature Granulocytes Absolute: 0.02 10*3/uL (ref 0.00–0.07)
Immature Granulocytes: 0.3 %
Instrument Absolute Neutrophil Count: 5.2 10*3/uL (ref 1.10–6.33)
Lymphocytes Absolute Automated: 1.17 10*3/uL (ref 0.42–3.22)
Lymphocytes Automated: 16.7 %
MCH: 29.2 pg (ref 25.1–33.5)
MCHC: 31.9 g/dL (ref 31.5–35.8)
MCV: 91.3 fL (ref 78.0–96.0)
MPV: 9.7 fL (ref 8.9–12.5)
Monocytes Absolute Automated: 0.41 10*3/uL (ref 0.21–0.85)
Monocytes: 5.8 %
Neutrophils Absolute: 5.2 10*3/uL (ref 1.10–6.33)
Neutrophils: 74 %
Nucleated RBC: 0 /100 WBC (ref 0.0–0.0)
Platelets: 296 10*3/uL (ref 142–346)
RBC: 3.91 10*6/uL — ABNORMAL LOW (ref 4.20–5.90)
RDW: 13 % (ref 11–15)
WBC: 7.02 10*3/uL (ref 3.10–9.50)

## 2022-08-30 LAB — GLUCOSE WHOLE BLOOD - POCT: Whole Blood Glucose POCT: 83 mg/dL (ref 70–100)

## 2022-08-30 MED ORDER — SODIUM CHLORIDE 0.9 % IV BOLUS
1000.0000 mL | Freq: Once | INTRAVENOUS | Status: AC
Start: 2022-08-30 — End: 2022-08-30
  Administered 2022-08-30: 1000 mL via INTRAVENOUS

## 2022-08-30 NOTE — ED Notes (Signed)
Patient verbally aggressive, demanding to have his IV removed stating he wants to leave as he has not been evaluated by a physician yet. This RN attempted to offer reassurance, patient demanding to leave. IV access removed, patient eloped from the treatment area. Dr. August Luz made aware.

## 2022-08-30 NOTE — ED Triage Notes (Addendum)
[facility]  Provider in Triage Note        Patient Name: [name]    Chief Complaint:   Chief Complaint   Patient presents with    Dysphagia       HPI: [name] is a 88 y.o. male, who has had a rapid medical screening evaluation initiated by myself. Cc pain to rt side of tongue,and mouth pain,  pain with swallowing, has not been able to eat or drink anything due to pain since he was seen here on 08/27/22. Denies fever, sore throat or URI sxs. States now feels dizzy and weak bc he hasn't been able to eat or drink.  Denies vomiting.    Medical/Surgical/Social history: as per HPI    Vitals: BP 131/57   Pulse 78   Temp 97.8 F (36.6 C) (Tympanic)   Resp 16   SpO2 98%     Pertinent brief exam:   Examination of area of concern: no obvious lesion or ulcer to tongue, post oropharynx with no erythema or exudates.    Preliminary orders: labs    Symptom based preliminary diagnosis/MDM: tongue/mouth pain advised to remain in the ED until further evaluation can be performed. Patient instructed to notify staff of any changes in condition while waiting.  This assessment is an initial evaluation to expedite care.

## 2022-08-30 NOTE — ED Triage Notes (Addendum)
Pt with c/o dizziness and weakness that began x1 week ago but worsened yesterday. Pt recently seen in facility for difficulty swallowing d/t tongue pain. Pt still having difficulty swallowing, loss of appetite, difficulty opening mouth, pain in tongue. Pt states nausea, chills, SOB. Pt states pain is 10/10. Pt states taking magic mouthwash prior to arrival.  Family states that pt has been unable to eat for the past x3 days.

## 2022-09-01 NOTE — ED Provider Notes (Signed)
I had no contact with this pt      Azzie Glatter, MD  09/01/22 1329

## 2023-03-07 ENCOUNTER — Emergency Department: Payer: No Typology Code available for payment source

## 2023-03-07 ENCOUNTER — Emergency Department
Admission: EM | Admit: 2023-03-07 | Discharge: 2023-03-07 | Disposition: A | Discharge status: Home / Self Care | Payer: No Typology Code available for payment source | Attending: Emergency Medicine | Admitting: Emergency Medicine

## 2023-03-07 DIAGNOSIS — J189 Pneumonia, unspecified organism: Secondary | ICD-10-CM | POA: Insufficient documentation

## 2023-03-07 DIAGNOSIS — F17210 Nicotine dependence, cigarettes, uncomplicated: Secondary | ICD-10-CM | POA: Insufficient documentation

## 2023-03-07 DIAGNOSIS — I493 Ventricular premature depolarization: Secondary | ICD-10-CM

## 2023-03-07 LAB — URINALYSIS REFLEX TO MICROSCOPIC EXAM - REFLEX TO CULTURE
Bilirubin, UA: NEGATIVE
Blood, UA: NEGATIVE
Ketones UA: NEGATIVE
Leukocyte Esterase, UA: NEGATIVE
Nitrite, UA: NEGATIVE
Specific Gravity UA: 1.02 (ref 1.001–1.035)
Urine pH: 6 (ref 5.0–8.0)
Urobilinogen, UA: 2 mg/dL — AB (ref 0.2–2.0)

## 2023-03-07 LAB — COMPREHENSIVE METABOLIC PANEL
ALT: 11 U/L (ref 0–55)
AST (SGOT): 16 U/L (ref 5–41)
Albumin/Globulin Ratio: 0.8 — ABNORMAL LOW (ref 0.9–2.2)
Albumin: 3.3 g/dL — ABNORMAL LOW (ref 3.5–5.0)
Alkaline Phosphatase: 103 U/L (ref 37–117)
Anion Gap: 9 (ref 5.0–15.0)
BUN: 15 mg/dL (ref 9.0–28.0)
Bilirubin, Total: 0.4 mg/dL (ref 0.2–1.2)
CO2: 25 mEq/L (ref 17–29)
Calcium: 9.3 mg/dL (ref 7.9–10.2)
Chloride: 106 mEq/L (ref 99–111)
Creatinine: 1.2 mg/dL (ref 0.5–1.5)
Globulin: 4.1 g/dL — ABNORMAL HIGH (ref 2.0–3.6)
Glucose: 145 mg/dL — ABNORMAL HIGH (ref 70–100)
Potassium: 4.2 mEq/L (ref 3.5–5.3)
Protein, Total: 7.4 g/dL (ref 6.0–8.3)
Sodium: 140 mEq/L (ref 135–145)
eGFR: 57.8 mL/min/{1.73_m2} — AB (ref >=60)

## 2023-03-07 LAB — ECG 12-LEAD
Atrial Rate: 67 {beats}/min
P Axis: 54 degrees
P-R Interval: 156 ms
Q-T Interval: 392 ms
QRS Duration: 82 ms
QTC Calculation (Bezet): 414 ms
R Axis: 56 degrees
T Axis: 56 degrees
Ventricular Rate: 67 {beats}/min

## 2023-03-07 LAB — CBC AND DIFFERENTIAL
Absolute NRBC: 0 10*3/uL (ref 0.00–0.00)
Basophils Absolute Automated: 0.05 10*3/uL (ref 0.00–0.08)
Basophils Automated: 0.6 %
Eosinophils Absolute Automated: 0.57 10*3/uL — ABNORMAL HIGH (ref 0.00–0.44)
Eosinophils Automated: 7.3 %
Hematocrit: 37.4 % — ABNORMAL LOW (ref 37.6–49.6)
Hgb: 11.8 g/dL — ABNORMAL LOW (ref 12.5–17.1)
Immature Granulocytes Absolute: 0.02 10*3/uL (ref 0.00–0.07)
Immature Granulocytes: 0.3 %
Instrument Absolute Neutrophil Count: 5.52 10*3/uL (ref 1.10–6.33)
Lymphocytes Absolute Automated: 1.17 10*3/uL (ref 0.42–3.22)
Lymphocytes Automated: 15 %
MCH: 29.2 pg (ref 25.1–33.5)
MCHC: 31.6 g/dL (ref 31.5–35.8)
MCV: 92.6 fL (ref 78.0–96.0)
MPV: 10.1 fL (ref 8.9–12.5)
Monocytes Absolute Automated: 0.47 10*3/uL (ref 0.21–0.85)
Monocytes: 6 %
Neutrophils Absolute: 5.52 10*3/uL (ref 1.10–6.33)
Neutrophils: 70.8 %
Nucleated RBC: 0 /100 WBC (ref 0.0–0.0)
Platelets: 322 10*3/uL (ref 142–346)
RBC: 4.04 10*6/uL — ABNORMAL LOW (ref 4.20–5.90)
RDW: 13 % (ref 11–15)
WBC: 7.8 10*3/uL (ref 3.10–9.50)

## 2023-03-07 LAB — HIGH SENSITIVITY TROPONIN-I: hs Troponin-I: <2.7 ng/L

## 2023-03-07 LAB — COVID-19 (SARS-COV-2) & INFLUENZA  A/B, NAA (ROCHE LIAT)
Influenza A: NOT DETECTED
Influenza B: NOT DETECTED
SARS CoV 2 Overall Result: NOT DETECTED

## 2023-03-07 MED ORDER — AZITHROMYCIN 500 MG IN 250 ML NS IVPB VIAL-MATE (CNR)
500.0000 mg | Freq: Once | INTRAVENOUS | Status: DC
Start: 2023-03-07 — End: 2023-03-07

## 2023-03-07 MED ORDER — STERILE WATER FOR INJECTION IJ/IV SOLN (WRAP)
2.0000 g | Freq: Once | INTRAMUSCULAR | Status: AC
Start: 2023-03-07 — End: 2023-03-07
  Administered 2023-03-07: 2 g via INTRAVENOUS

## 2023-03-07 MED ORDER — DIPHENHYDRAMINE HCL 25 MG PO CAPS
25.0000 mg | ORAL_CAPSULE | Freq: Once | ORAL | Status: AC
Start: 2023-03-07 — End: 2023-03-07
  Administered 2023-03-07: 25 mg via ORAL
  Filled 2023-03-07: qty 1

## 2023-03-07 MED ORDER — CEFTRIAXONE SODIUM 2 G IJ SOLR
INTRAMUSCULAR | Status: DC
Start: 2023-03-07 — End: 2023-03-07
  Filled 2023-03-07: qty 2000

## 2023-03-07 MED ORDER — AZITHROMYCIN 500 MG IN 250 ML NS IVPB VIAL-MATE (CNR)
500.0000 mg | Freq: Once | INTRAVENOUS | Status: AC
Start: 2023-03-07 — End: 2023-03-07
  Administered 2023-03-07: 500 mg via INTRAVENOUS
  Filled 2023-03-07: qty 500
  Filled 2023-03-07 (×2): qty 250

## 2023-03-07 MED ORDER — ALBUTEROL-IPRATROPIUM 2.5-0.5 (3) MG/3ML IN SOLN
RESPIRATORY_TRACT | Status: DC
Start: 2023-03-07 — End: 2023-03-07
  Filled 2023-03-07: qty 3

## 2023-03-07 MED ORDER — AMOXICILLIN-POT CLAVULANATE 875-125 MG PO TABS
1.0000 | ORAL_TABLET | Freq: Two times a day (BID) | ORAL | 0 refills | Status: AC
Start: 2023-03-07 — End: 2023-03-14

## 2023-03-07 MED ORDER — ALBUTEROL-IPRATROPIUM 2.5-0.5 (3) MG/3ML IN SOLN
3.0000 mL | Freq: Once | RESPIRATORY_TRACT | Status: AC
Start: 2023-03-07 — End: 2023-03-07
  Administered 2023-03-07: 3 mL via RESPIRATORY_TRACT

## 2023-03-07 NOTE — Discharge Instructions (Signed)
You were seen in the emergency department for evaluation and management of muscle and body aches, fatigue, cough, congestion.  Your evaluation here was concerning for pneumonia.  You are given antibiotics.  You did have a reaction to one of the antibiotics called azithromycin.  You should not take this medication as it can cause another reaction or a worsening reaction.  You will be take antibiotics twice daily for the next 7 days.  We recommend that you follow-up closely with your primary care provider.

## 2023-03-07 NOTE — ED Triage Notes (Signed)
[name] is a 89 y.o. male presents to ER for cough and rash all over body. Reports symptoms started 2 weeks ago.    Pt also reports SOB

## 2023-03-07 NOTE — ED Provider Notes (Signed)
History     Chief Complaint   Patient presents with    Shortness of Breath    Pruritus     HPI [name] is a 89 y.o. male who presents with cough, congestion, shortness of breath for approximately 2 weeks.  She denies any fevers, chills, nausea or vomiting.  She denies any dark or bloody stools.  No recent falls or trauma.  No hemoptysis or hematemesis.  No leg swelling.    Past Medical History:   Diagnosis Date    Diabetes mellitus     No diagnosis        Past Surgical History:   Procedure Laterality Date    APPENDECTOMY (OPEN)      EGD, BIOPSY N/A 06/30/2015    Procedure: EGD, BIOPSY;  Surgeon: [name], MD;  Location: [facility];  Service: Gastroenterology;  Laterality: N/A;    EGD, COLONOSCOPY N/A 08/04/2015    Procedure: EGD, COLONOSCOPY;  Surgeon: [name], MD;  Location: [facility];  Service: Gastroenterology;  Laterality: N/A;       History reviewed. No pertinent family history.    Social  Social History     Tobacco Use    Smoking status: Every Day     Current packs/day: 0.25     Types: Cigarettes    Smokeless tobacco: Current    Tobacco comments:     smokes 2-3 cigaretes wekly   Vaping Use    Vaping status: Never Used   Substance Use Topics    Alcohol use: Yes     Comment: 3-4 beers per week    Drug use: No       .     Allergies   Allergen Reactions    Azithromycin Hives       Home Medications       Med List Status: In Progress Set By: Ardith Dark, RN at 03/07/2023  4:15 PM              acetaminophen (TYLENOL) 325 MG tablet     Take 2 tablets (650 mg total) by mouth every 4 (four) hours as needed for Pain.     benzocaine (ORAJEL) 10 % mucosal gel     Use as directed in the mouth or throat as needed for Pain     diphenhydrAMINE-lidocaine viscous-nystatin-alum & mag hydroxide-simethicone (MAGIC MOUTHWASH) suspension     Take 10 mLs by mouth 4 (four) times daily     docusate sodium (COLACE) 100 MG capsule     Take 1 capsule (100 mg) by mouth daily     losartan (COZAAR) 25 MG tablet      Take 1 tablet (25 mg) by mouth daily     meclizine (ANTIVERT) 12.5 MG tablet     Take 1 tablet (12.5 mg total) by mouth 3 (three) times daily as needed for Dizziness For head Spinning     metFORMIN (GLUCOPHAGE) 1000 MG tablet     Take 1 tablet (1,000 mg total) by mouth every morning with breakfast.             Review of Systems   Constitutional:  Negative for chills and fever.   HENT:  Positive for congestion. Negative for rhinorrhea.    Eyes:  Negative for photophobia and visual disturbance.   Respiratory:  Positive for cough and shortness of breath. Negative for chest tightness.    Cardiovascular:  Negative for chest pain and palpitations.   Gastrointestinal:  Negative for abdominal pain, nausea  and vomiting.   Endocrine: Negative for polydipsia and polyuria.   Genitourinary:  Negative for dysuria and frequency.   Musculoskeletal:  Negative for arthralgias and back pain.   Skin:  Negative for color change and rash.   Neurological:  Negative for light-headedness and headaches.   Psychiatric/Behavioral:  Negative for agitation and behavioral problems.        Physical Exam    BP: 138/55, Heart Rate: (!) 42, Temp: 97.9 F (36.6 C), Resp Rate: 20, SpO2: 99 %, Weight: 63.7 kg    Physical Exam  Constitutional:       Appearance: Normal appearance.   HENT:      Head: Normocephalic and atraumatic.      Right Ear: External ear normal.      Left Ear: External ear normal.      Nose: Nose normal.   Eyes:      Extraocular Movements: Extraocular movements intact.      Pupils: Pupils are equal, round, and reactive to light.   Cardiovascular:      Rate and Rhythm: Normal rate and regular rhythm.   Pulmonary:      Effort: Pulmonary effort is normal.      Breath sounds: Wheezing and rhonchi present.   Abdominal:      Palpations: Abdomen is soft.      Tenderness: There is no abdominal tenderness. There is no right CVA tenderness or left CVA tenderness.   Musculoskeletal:         General: Normal range of motion.      Cervical back:  Normal range of motion and neck supple.      Right lower leg: No edema.      Left lower leg: No edema.   Skin:     General: Skin is warm.      Capillary Refill: Capillary refill takes less than 2 seconds.   Neurological:      General: No focal deficit present.      Mental Status: He is alert and oriented to person, place, and time.      GCS: GCS eye subscore is 4. GCS verbal subscore is 5. GCS motor subscore is 6.      Cranial Nerves: Cranial nerves 2-12 are intact. No cranial nerve deficit.      Sensory: Sensation is intact. No sensory deficit.      Motor: Motor function is intact. No weakness.      Gait: Gait is intact.   Psychiatric:         Attention and Perception: Attention and perception normal.         Mood and Affect: Mood and affect normal.         Speech: Speech normal.         Behavior: Behavior normal.           MDM and ED Course     ED Medication Orders (From admission, onward)      Start Ordered     Status Ordering Provider    03/07/23 1703 03/07/23 1702  diphenhydrAMINE (BENADRYL) capsule 25 mg  Once        Route: Oral  Ordered Dose: 25 mg       Last MAR action: Given Starlyn Skeans    03/07/23 1538 03/07/23 1537    Once        Route: Intravenous  Ordered Dose: 500 mg       Discontinued Starlyn Skeans    03/07/23 1537 03/07/23 1537  Note to Pharmacy: [place] [facility]: cabinet override    Discontinued     03/07/23 1524 03/07/23 1523  albuterol-ipratropium (DUO-NEB) 2.5-0.5(3) mg/3 mL nebulizer 3 mL  RT - Once        Route: Nebulization  Ordered Dose: 3 mL       Last MAR action: Not Given Starlyn Skeans    03/07/23 1524 03/07/23 1523  cefTRIAXone (ROCEPHIN) 2 g in sterile water (preservative free) 20 mL IV push injection  Once        Route: Intravenous  Ordered Dose: 2 g       Last MAR action: Given Starlyn Skeans    03/07/23 1524 03/07/23 1523  azithromycin (ZITHROMAX) 500 mg in sodium chloride 0.9 % 250 mL IVPB  Once        Route: Intravenous  Ordered Dose: 500 mg       Last MAR action:  Stopped Starlyn Skeans               Medical Decision Making  89 year old male presents with cough, congestion, shortness of breath  Consider pneumonia, bronchitis, pneumothorax, pulmonary edema  Lab work and imaging ordered  Reassuring CBC, reassuring CMP, low magnitude troponin  CXR with findings concerning for possible left lobe infiltrate  Given the patient's age and duration of symptoms, will treat for bacterial pneumonia/CAP  Strict return precautions.  Disposition: Home self-care/discharge.        Risk  Prescription drug management.                 EKG: Normal sinus rhythm, PVCs, no overtly ischemic appearing ST segment elevations or depressions or concerning T wave inversions    Procedures    Clinical Impression & Disposition     Clinical Impression  Final diagnoses:   Pneumonia due to infectious organism, unspecified laterality, unspecified part of lung        ED Disposition       ED Disposition   Discharge    Condition   --    Date/Time   Mon Mar 07, 2023  5:26 PM    Comment   [name] discharge to home/self care.    Condition at disposition: Stable                  Discharge Medication List as of 03/07/2023  5:33 PM        START taking these medications    Details   amoxicillin-clavulanate (AUGMENTIN) 875-125 MG per tablet Take 1 tablet by mouth 2 (two) times daily for 7 days, Starting Mon 03/07/2023, Until Mon 03/14/2023, [facility]                         Kaylyn Garrow, Susann Givens, MD  03/27/23 1444

## 2023-03-07 NOTE — ED Notes (Signed)
RN told pt that she would be back in a second to take out pt IV. When RN returned Pt had removed his IV by himself and was attempting to get out of bed. RN told pt that it was important for him to wait. RN verified that all of IV was out of pt and catheter was intact. Rn then discharged pt.

## 2023-03-07 NOTE — ED Triage Notes (Signed)
[facility]  Provider in Triage Note        Patient Name: Alexandro Line    Chief Complaint:   Chief Complaint   Patient presents with    Shortness of Breath    Pruritus       HPI: Kahleel Fadeley is a 89 y.o. male, who has had a rapid medical screening evaluation initiated by myself. Cough, SOB, and itchy rash that started 2 weeks ago.  Denies chest pain or swelling to lower extremities.  Denies new lotions, detergents, soaps or medications.     Medical/Surgical/Social history: as per HPI    Vitals: BP 138/55   Pulse (!) 42   Temp 97.9 F (36.6 C) (Oral)   Resp 20   Wt 63.7 kg   SpO2 99%   BMI 20.75 kg/m     Pertinent brief exam:   Examination of area of concern: NAD, respirations even and unlabored     Preliminary orders: labs, EKG, cxr     Symptom based preliminary diagnosis/MDM: SOB, pruritus     Patient advised to remain in the ED until further evaluation can be performed. Patient instructed to notify staff of any changes in condition while waiting.  This assessment is an initial evaluation to expedite care.

## 2023-09-03 ENCOUNTER — Emergency Department
Admission: EM | Admit: 2023-09-03 | Discharge: 2023-09-03 | Discharge status: Against Medical Advice | Payer: No Typology Code available for payment source | Attending: Emergency Medicine | Admitting: Emergency Medicine

## 2023-09-03 DIAGNOSIS — Z5321 Procedure and treatment not carried out due to patient leaving prior to being seen by health care provider: Secondary | ICD-10-CM | POA: Insufficient documentation

## 2023-09-03 MED ORDER — TETRACAINE HCL 0.5 % OP SOLN
1.0000 [drp] | Freq: Once | OPHTHALMIC | Status: DC
Start: 2023-09-03 — End: 2023-09-03
  Filled 2023-09-03: qty 4

## 2023-09-03 MED ORDER — FLUORESCEIN SODIUM 1 MG OP STRP
1.0000 | ORAL_STRIP | Freq: Once | OPHTHALMIC | Status: DC
Start: 2023-09-03 — End: 2023-09-03
  Filled 2023-09-03: qty 1

## 2023-09-03 NOTE — ED Triage Notes (Signed)
 [name] is a 89 y.o. male who presents to ED with c/o pain and discomfort to right eye noted since yesterday. Pt states "it's the lens, it's fallen down". Right eye noted to be cloudy, reports vision loss to that eye over the past week. Reports mil

## 2023-10-11 ENCOUNTER — Emergency Department
Admission: EM | Admit: 2023-10-11 | Discharge: 2023-10-12 | Disposition: A | Discharge status: Home / Self Care | Payer: No Typology Code available for payment source | Attending: Residents | Admitting: Residents

## 2023-10-11 ENCOUNTER — Emergency Department: Payer: No Typology Code available for payment source

## 2023-10-11 DIAGNOSIS — R0602 Shortness of breath: Secondary | ICD-10-CM | POA: Insufficient documentation

## 2023-10-11 DIAGNOSIS — R42 Dizziness and giddiness: Secondary | ICD-10-CM | POA: Insufficient documentation

## 2023-10-11 DIAGNOSIS — R079 Chest pain, unspecified: Secondary | ICD-10-CM

## 2023-10-11 LAB — COMPREHENSIVE METABOLIC PANEL
ALT: 21 U/L (ref <=55)
AST (SGOT): 27 U/L (ref <=41)
Albumin/Globulin Ratio: 1.1 (ref 0.9–2.2)
Albumin: 3.9 g/dL (ref 3.5–5.0)
Alkaline Phosphatase: 90 U/L (ref 37–117)
Anion Gap: 10 (ref 5.0–15.0)
BUN: 19 mg/dL (ref 9–28)
Bilirubin, Total: 0.4 mg/dL (ref 0.2–1.2)
CO2: 23 meq/L (ref 17–29)
Calcium: 9.2 mg/dL (ref 7.9–10.2)
Chloride: 103 meq/L (ref 99–111)
Creatinine: 1.2 mg/dL (ref 0.5–1.5)
GFR: 57.8 mL/min/{1.73_m2} — ABNORMAL LOW (ref >=60.0)
Globulin: 3.5 g/dL (ref 2.0–3.6)
Glucose: 232 mg/dL — ABNORMAL HIGH (ref 70–100)
Potassium: 4.7 meq/L (ref 3.5–5.3)
Protein, Total: 7.4 g/dL (ref 6.0–8.3)
Sodium: 136 meq/L (ref 135–145)

## 2023-10-11 LAB — LAB USE ONLY - CBC WITH DIFFERENTIAL
Absolute Basophils: 0.04 10*3/uL (ref 0.00–0.08)
Absolute Eosinophils: 0.16 10*3/uL (ref 0.00–0.44)
Absolute Immature Granulocytes: 0.04 10*3/uL (ref 0.00–0.07)
Absolute Lymphocytes: 1.19 10*3/uL (ref 0.42–3.22)
Absolute Monocytes: 0.55 10*3/uL (ref 0.21–0.85)
Absolute Neutrophils: 7.38 10*3/uL — ABNORMAL HIGH (ref 1.10–6.33)
Absolute nRBC: 0 10*3/uL (ref <=0.00)
Basophils %: 0.4 %
Eosinophils %: 1.7 %
Hematocrit: 39.8 % (ref 37.6–49.6)
Hemoglobin: 12.7 g/dL (ref 12.5–17.1)
Immature Granulocytes %: 0.4 %
Lymphocytes %: 12.7 %
MCH: 30.3 pg (ref 25.1–33.5)
MCHC: 31.9 g/dL (ref 31.5–35.8)
MCV: 95 fL (ref 78.0–96.0)
MPV: 9.8 fL (ref 8.9–12.5)
Monocytes %: 5.9 %
Neutrophils %: 78.9 %
Platelet Count: 243 10*3/uL (ref 142–346)
Preliminary Absolute Neutrophil Count: 7.38 10*3/uL — ABNORMAL HIGH (ref 1.10–6.33)
RBC: 4.19 10*6/uL — ABNORMAL LOW (ref 4.20–5.90)
RDW: 14 % (ref 11–15)
WBC: 9.36 10*3/uL (ref 3.10–9.50)
nRBC %: 0 /100{WBCs} (ref <=0.0)

## 2023-10-11 LAB — MAGNESIUM: Magnesium: 2 mg/dL (ref 1.6–2.6)

## 2023-10-11 LAB — HIGH SENSITIVITY TROPONIN-I: hs Troponin: <2.7 ng/L (ref <=35.0)

## 2023-10-11 LAB — WHOLE BLOOD GLUCOSE POCT: Whole Blood Glucose POCT: 223 mg/dL — ABNORMAL HIGH (ref 70–100)

## 2023-10-11 MED ORDER — LABETALOL HCL 5 MG/ML IV SOLN (WRAP)
10.0000 mg | Freq: Once | INTRAVENOUS | Status: AC
Start: 2023-10-11 — End: 2023-10-11
  Administered 2023-10-11: 10 mg via INTRAVENOUS
  Filled 2023-10-11: qty 4

## 2023-10-11 NOTE — ED Triage Notes (Signed)
[name] is a 89 y.o. male  BIBA to the ED c/o Generalized Weakness and Near Syncope. Patient states that he was out shopping and started feeling weak all over so he sat down. Patient states when he changes position he experiences near syncope. Patient denies any CP. Patient endorses headache.

## 2023-10-11 NOTE — ED Provider Notes (Signed)
History     Chief Complaint   Patient presents with    Generalized weakness    Near syncope     89 year old male past medical history of diabetes presented to emergency department with complaint of dizziness, shortness of breath.  Per patient, was out shopping today when he noted onset of dizziness, worsened with positional change noted some shortness breath describing a sensation of unable to catch his breath.  Patient thinks this may be due to his blood sugar.  No recent fevers but is endorsing a cough.  Endorsing some mild substernal chest discomfort as well.  Endorsing nausea but no vomiting.  Denying any abdominal pain, urinary symptoms, diarrhea,.  Endorsing right leg pain/swelling.  No previous history of ACS, hypertension, blood clots.           Medical History[1]    Past Surgical History[2]    Family History[3]    Social  Social History[4]    .     Allergies[5]    Home Medications               acetaminophen (TYLENOL) 325 MG tablet     Take 2 tablets (650 mg total) by mouth every 4 (four) hours as needed for Pain.     benzocaine (ORAJEL) 10 % mucosal gel     Use as directed in the mouth or throat as needed for Pain     diphenhydrAMINE-lidocaine viscous-nystatin-alum & mag hydroxide-simethicone (MAGIC MOUTHWASH) suspension     Take 10 mLs by mouth 4 (four) times daily     docusate sodium (COLACE) 100 MG capsule     Take 1 capsule (100 mg) by mouth daily     losartan (COZAAR) 25 MG tablet     Take 1 tablet (25 mg) by mouth daily     meclizine (ANTIVERT) 12.5 MG tablet     Take 1 tablet (12.5 mg total) by mouth 3 (three) times daily as needed for Dizziness For head Spinning     metFORMIN (GLUCOPHAGE) 1000 MG tablet     Take 1 tablet (1,000 mg total) by mouth every morning with breakfast.             Review of Systems   All other systems reviewed and are negative.      Physical Exam    BP: (!) 224/92, Heart Rate: 72, Resp Rate: 17, SpO2: 99 %    Physical Exam  Vitals and nursing note reviewed.    Constitutional:       General: He is not in acute distress.     Appearance: Normal appearance. He is normal weight. He is not ill-appearing.   HENT:      Head: Normocephalic.      Right Ear: External ear normal.      Left Ear: External ear normal.      Nose: Nose normal.      Mouth/Throat:      Mouth: Mucous membranes are moist.      Pharynx: Oropharynx is clear.   Eyes:      General: No scleral icterus.     Extraocular Movements: Extraocular movements intact.      Conjunctiva/sclera: Conjunctivae normal.      Pupils: Pupils are equal, round, and reactive to light.   Cardiovascular:      Rate and Rhythm: Normal rate and regular rhythm.      Pulses: Normal pulses.      Heart sounds: Normal heart sounds. No murmur heard.  Pulmonary:  Effort: Pulmonary effort is normal. No respiratory distress.      Breath sounds: Normal breath sounds. No wheezing or rhonchi.   Abdominal:      General: Abdomen is flat. There is no distension.      Palpations: Abdomen is soft.      Tenderness: There is no abdominal tenderness. There is no guarding.   Musculoskeletal:         General: No swelling or tenderness. Normal range of motion.      Cervical back: Normal range of motion. No rigidity or tenderness.      Right lower leg: Edema present.      Left lower leg: Edema present.      Comments: Trace peripheral edema to lower extremities.  Tenderness to right side calf.  Edema is roughly equal   Skin:     General: Skin is warm and dry.      Capillary Refill: Capillary refill takes less than 2 seconds.   Neurological:      General: No focal deficit present.      Mental Status: He is alert and oriented to person, place, and time.      Cranial Nerves: No cranial nerve deficit.      Sensory: No sensory deficit.      Motor: No weakness.   Psychiatric:         Mood and Affect: Mood normal.         Behavior: Behavior normal.           MDM and ED Course     ED Medication Orders (From admission, onward)      Start Ordered     Status Ordering  Provider    10/11/23 2117 10/11/23 2116  labetalol (NORMODYNE,TRANDATE) injection 10 mg  Once        Route: Intravenous  Ordered Dose: 10 mg       Gypsy Decant, Alexiz Sustaita M               Medical Decision Making  Amount and/or Complexity of Data Reviewed  Labs: ordered.  Radiology: ordered.  ECG/medicine tests: ordered.    Risk  Prescription drug management.          ED Course as of 10/11/23 2121   Tue Oct 11, 2023   2121 EKG rhythm strip: Sinus rhythm at 72 bpm, frequent PVCs noted, unifocal in an appearance [IH]      ED Course User Index  [IH] Neville Route, MD             Procedures    Clinical Impression & Disposition     Clinical Impression  Final diagnoses:   None        ED Disposition       None             New Prescriptions    No medications on file                   [1]   Past Medical History:  Diagnosis Date    Diabetes mellitus     No diagnosis    [2]   Past Surgical History:  Procedure Laterality Date    APPENDECTOMY (OPEN)      EGD, BIOPSY N/A 06/30/2015    Procedure: EGD, BIOPSY;  Surgeon: [name], MD;  Location: [facility];  Service: Gastroenterology;  Laterality: N/A;    EGD, COLONOSCOPY N/A 08/04/2015    Procedure: EGD, COLONOSCOPY;  Surgeon: Elyse Hsu  J, MD;  [facility]  Service: G[facility];  Laterality: N/A;   [3] No family history on file.  [4]   Social History  Tobacco Use    Smoking status: Every Day     Current packs/day: 0.25     Types: Cigarettes    Smokeless tobacco: Current    Tobacco comments:     smokes 2-3 cigaretes wekly   Vaping Use    Vaping status: Never Used   Substance Use Topics    Alcohol use: Yes     Comment: 3-4 beers per week    Drug use: No   [5]   Allergies  Allergen Reactions    Azithromycin Hives

## 2023-10-11 NOTE — ED Notes (Signed)
Bed: GR7  Expected date:   Expected time:   Means of arrival:   Comments:  M [id number]

## 2023-10-12 LAB — URINALYSIS WITH REFLEX TO MICROSCOPIC EXAM - REFLEX TO CULTURE
Urine Bilirubin: NEGATIVE
Urine Blood: NEGATIVE
Urine Ketones: NEGATIVE mg/dL
Urine Leukocyte Esterase: NEGATIVE
Urine Nitrite: NEGATIVE
Urine Protein: NEGATIVE
Urine Specific Gravity: 1.019 (ref 1.001–1.035)
Urine Urobilinogen: NORMAL mg/dL (ref 0.2–2.0)
Urine pH: 6 (ref 5.0–8.0)

## 2023-10-12 LAB — ECG 12-LEAD
Atrial Rate: 74 {beats}/min
P Axis: 57 degrees
P-R Interval: 180 ms
Q-T Interval: 388 ms
QRS Duration: 72 ms
QTC Calculation (Bezet): 430 ms
R Axis: 50 degrees
T Axis: 35 degrees
Ventricular Rate: 74 {beats}/min

## 2023-10-12 LAB — LAB USE ONLY - URINE GRAY CULTURE HOLD TUBE

## 2023-10-12 MED ORDER — MECLIZINE HCL 12.5 MG PO TABS
12.5000 mg | ORAL_TABLET | Freq: Three times a day (TID) | ORAL | 0 refills | Status: AC | PRN
Start: 2023-10-12 — End: ?

## 2023-10-12 MED ORDER — MECLIZINE HCL 12.5 MG PO TABS
25.0000 mg | ORAL_TABLET | Freq: Once | ORAL | Status: AC
Start: 2023-10-12 — End: 2023-10-12
  Administered 2023-10-12: 25 mg via ORAL
  Filled 2023-10-12: qty 2
